# Patient Record
Sex: Female | Born: 1940
Health system: Southern US, Community
[De-identification: ages and names within clinical notes are randomized; demographics above are authoritative.]

## PROBLEM LIST (undated history)

## (undated) ENCOUNTER — Emergency Department (HOSPITAL_COMMUNITY): Discharge status: Absent without leave | Payer: PPO

## (undated) DIAGNOSIS — R7303 Prediabetes: Secondary | ICD-10-CM

## (undated) DIAGNOSIS — I4891 Unspecified atrial fibrillation: Secondary | ICD-10-CM

## (undated) DIAGNOSIS — N2889 Other specified disorders of kidney and ureter: Secondary | ICD-10-CM

## (undated) DIAGNOSIS — I10 Essential (primary) hypertension: Secondary | ICD-10-CM

## (undated) DIAGNOSIS — I2510 Atherosclerotic heart disease of native coronary artery without angina pectoris: Secondary | ICD-10-CM

## (undated) DIAGNOSIS — I5022 Chronic systolic (congestive) heart failure: Secondary | ICD-10-CM

## (undated) DIAGNOSIS — I639 Cerebral infarction, unspecified: Secondary | ICD-10-CM

## (undated) DIAGNOSIS — E039 Hypothyroidism, unspecified: Secondary | ICD-10-CM

## (undated) DIAGNOSIS — J449 Chronic obstructive pulmonary disease, unspecified: Secondary | ICD-10-CM

## (undated) DIAGNOSIS — F419 Anxiety disorder, unspecified: Secondary | ICD-10-CM

## (undated) HISTORY — DX: Cerebral infarction, unspecified: I63.9

---

## 1997-12-26 ENCOUNTER — Inpatient Hospital Stay (HOSPITAL_COMMUNITY): Admission: AD | Admit: 1997-12-26 | Discharge: 1997-12-29 | Payer: Self-pay | Admitting: Cardiology

## 2015-01-10 ENCOUNTER — Other Ambulatory Visit: Payer: Self-pay | Admitting: Neurosurgery

## 2015-01-10 DIAGNOSIS — M48061 Spinal stenosis, lumbar region without neurogenic claudication: Secondary | ICD-10-CM

## 2015-03-01 ENCOUNTER — Ambulatory Visit
Admission: RE | Admit: 2015-03-01 | Discharge: 2015-03-01 | Disposition: A | Discharge status: Home / Self Care | Payer: Commercial Managed Care - HMO | Source: Ambulatory Visit | Attending: Neurosurgery | Admitting: Neurosurgery

## 2015-03-01 DIAGNOSIS — M48061 Spinal stenosis, lumbar region without neurogenic claudication: Secondary | ICD-10-CM

## 2015-03-01 MED ORDER — METHYLPREDNISOLONE ACETATE 40 MG/ML INJ SUSP (RADIOLOG
120.0000 mg | Freq: Once | INTRAMUSCULAR | Status: AC
  Administered 2015-03-01: 120 mg via EPIDURAL

## 2015-03-01 MED ORDER — IOHEXOL 180 MG/ML  SOLN
1.0000 mL | Freq: Once | INTRAMUSCULAR | Status: AC | PRN
  Administered 2015-03-01: 1 mL via EPIDURAL

## 2015-03-01 NOTE — Discharge Instructions (Addendum)
Post Procedure Spinal Discharge Instruction Sheet  1. You may resume a regular diet and any medications that you routinely take (including pain medications).  2. No driving day of procedure.  3. Light activity throughout the rest of the day.  Do not do any strenuous work, exercise, bending or lifting.  The day following the procedure, you can resume normal physical activity but you should refrain from exercising or physical therapy for at least three days thereafter.   Common Side Effects:   Headaches- take your usual medications as directed by your physician.  Increase your fluid intake.  Caffeinated beverages may be helpful.  Lie flat in bed until your headache resolves.   Restlessness or inability to sleep- you may have trouble sleeping for the next few days.  Ask your referring physician if you need any medication for sleep.   Facial flushing or redness- should subside within a few days.   Increased pain- a temporary increase in pain a day or two following your procedure is not unusual.  Take your pain medication as prescribed by your referring physician.   Leg cramps  Please contact our office at [phone] for the following symptoms:  Fever greater than 100 degrees.  Headaches unresolved with medication after 2-3 days.  Increased swelling, pain, or redness at injection site.  Thank you for visiting our office.   [facility] [place].

## 2015-05-16 DIAGNOSIS — I482 Chronic atrial fibrillation, unspecified: Secondary | ICD-10-CM | POA: Insufficient documentation

## 2015-05-16 DIAGNOSIS — E785 Hyperlipidemia, unspecified: Secondary | ICD-10-CM | POA: Insufficient documentation

## 2015-05-16 DIAGNOSIS — I10 Essential (primary) hypertension: Secondary | ICD-10-CM | POA: Insufficient documentation

## 2015-05-16 DIAGNOSIS — E088 Diabetes mellitus due to underlying condition with unspecified complications: Secondary | ICD-10-CM | POA: Insufficient documentation

## 2015-05-16 DIAGNOSIS — I2510 Atherosclerotic heart disease of native coronary artery without angina pectoris: Secondary | ICD-10-CM | POA: Insufficient documentation

## 2015-05-16 DIAGNOSIS — I6522 Occlusion and stenosis of left carotid artery: Secondary | ICD-10-CM | POA: Insufficient documentation

## 2015-06-14 ENCOUNTER — Other Ambulatory Visit: Payer: Self-pay | Admitting: Neurosurgery

## 2015-06-14 DIAGNOSIS — M48061 Spinal stenosis, lumbar region without neurogenic claudication: Secondary | ICD-10-CM

## 2015-06-20 ENCOUNTER — Ambulatory Visit
Admission: RE | Admit: 2015-06-20 | Discharge: 2015-06-20 | Disposition: A | Discharge status: Home / Self Care | Payer: Commercial Managed Care - HMO | Source: Ambulatory Visit | Attending: Neurosurgery | Admitting: Neurosurgery

## 2015-06-20 DIAGNOSIS — M48061 Spinal stenosis, lumbar region without neurogenic claudication: Secondary | ICD-10-CM

## 2015-06-20 MED ORDER — METHYLPREDNISOLONE ACETATE 40 MG/ML INJ SUSP (RADIOLOG
120.0000 mg | Freq: Once | INTRAMUSCULAR | Status: AC
  Administered 2015-06-20: 120 mg via EPIDURAL

## 2015-06-20 MED ORDER — IOHEXOL 180 MG/ML  SOLN
1.0000 mL | Freq: Once | INTRAMUSCULAR | Status: DC | PRN
  Administered 2015-06-20: 1 mL via EPIDURAL

## 2015-06-20 NOTE — Discharge Instructions (Signed)

## 2015-11-14 DIAGNOSIS — I10 Essential (primary) hypertension: Secondary | ICD-10-CM | POA: Diagnosis not present

## 2015-11-14 DIAGNOSIS — E088 Diabetes mellitus due to underlying condition with unspecified complications: Secondary | ICD-10-CM | POA: Diagnosis not present

## 2015-11-14 DIAGNOSIS — I482 Chronic atrial fibrillation: Secondary | ICD-10-CM | POA: Diagnosis not present

## 2015-11-14 DIAGNOSIS — E785 Hyperlipidemia, unspecified: Secondary | ICD-10-CM | POA: Diagnosis not present

## 2015-11-14 DIAGNOSIS — I6522 Occlusion and stenosis of left carotid artery: Secondary | ICD-10-CM | POA: Diagnosis not present

## 2015-11-21 DIAGNOSIS — R7309 Other abnormal glucose: Secondary | ICD-10-CM | POA: Diagnosis not present

## 2015-11-21 DIAGNOSIS — Z6837 Body mass index (BMI) 37.0-37.9, adult: Secondary | ICD-10-CM | POA: Diagnosis not present

## 2015-11-21 DIAGNOSIS — E785 Hyperlipidemia, unspecified: Secondary | ICD-10-CM | POA: Diagnosis not present

## 2015-11-21 DIAGNOSIS — J449 Chronic obstructive pulmonary disease, unspecified: Secondary | ICD-10-CM | POA: Diagnosis not present

## 2015-11-21 DIAGNOSIS — I10 Essential (primary) hypertension: Secondary | ICD-10-CM | POA: Diagnosis not present

## 2016-03-14 DIAGNOSIS — R7309 Other abnormal glucose: Secondary | ICD-10-CM | POA: Diagnosis not present

## 2016-03-14 DIAGNOSIS — E785 Hyperlipidemia, unspecified: Secondary | ICD-10-CM | POA: Diagnosis not present

## 2016-03-14 DIAGNOSIS — M25572 Pain in left ankle and joints of left foot: Secondary | ICD-10-CM | POA: Diagnosis not present

## 2016-03-14 DIAGNOSIS — M4806 Spinal stenosis, lumbar region: Secondary | ICD-10-CM | POA: Diagnosis not present

## 2016-03-14 DIAGNOSIS — J449 Chronic obstructive pulmonary disease, unspecified: Secondary | ICD-10-CM | POA: Diagnosis not present

## 2016-03-14 DIAGNOSIS — M545 Low back pain: Secondary | ICD-10-CM | POA: Diagnosis not present

## 2016-03-14 DIAGNOSIS — I10 Essential (primary) hypertension: Secondary | ICD-10-CM | POA: Diagnosis not present

## 2016-03-25 DIAGNOSIS — M79672 Pain in left foot: Secondary | ICD-10-CM | POA: Diagnosis not present

## 2016-03-25 DIAGNOSIS — M79671 Pain in right foot: Secondary | ICD-10-CM | POA: Insufficient documentation

## 2016-03-25 DIAGNOSIS — M722 Plantar fascial fibromatosis: Secondary | ICD-10-CM | POA: Insufficient documentation

## 2016-03-25 DIAGNOSIS — M24571 Contracture, right ankle: Secondary | ICD-10-CM | POA: Insufficient documentation

## 2016-03-25 DIAGNOSIS — R202 Paresthesia of skin: Secondary | ICD-10-CM | POA: Insufficient documentation

## 2016-05-08 DIAGNOSIS — I482 Chronic atrial fibrillation: Secondary | ICD-10-CM | POA: Diagnosis not present

## 2016-05-08 DIAGNOSIS — I10 Essential (primary) hypertension: Secondary | ICD-10-CM | POA: Diagnosis not present

## 2016-05-08 DIAGNOSIS — E785 Hyperlipidemia, unspecified: Secondary | ICD-10-CM | POA: Diagnosis not present

## 2016-05-08 DIAGNOSIS — I2510 Atherosclerotic heart disease of native coronary artery without angina pectoris: Secondary | ICD-10-CM | POA: Diagnosis not present

## 2016-05-08 DIAGNOSIS — I6522 Occlusion and stenosis of left carotid artery: Secondary | ICD-10-CM | POA: Diagnosis not present

## 2016-05-08 DIAGNOSIS — E088 Diabetes mellitus due to underlying condition with unspecified complications: Secondary | ICD-10-CM | POA: Diagnosis not present

## 2016-06-17 DIAGNOSIS — M8589 Other specified disorders of bone density and structure, multiple sites: Secondary | ICD-10-CM | POA: Diagnosis not present

## 2016-06-17 DIAGNOSIS — E039 Hypothyroidism, unspecified: Secondary | ICD-10-CM | POA: Diagnosis not present

## 2016-06-17 DIAGNOSIS — J449 Chronic obstructive pulmonary disease, unspecified: Secondary | ICD-10-CM | POA: Diagnosis not present

## 2016-06-17 DIAGNOSIS — E785 Hyperlipidemia, unspecified: Secondary | ICD-10-CM | POA: Diagnosis not present

## 2016-06-17 DIAGNOSIS — I10 Essential (primary) hypertension: Secondary | ICD-10-CM | POA: Diagnosis not present

## 2016-06-17 DIAGNOSIS — R7309 Other abnormal glucose: Secondary | ICD-10-CM | POA: Diagnosis not present

## 2016-06-17 DIAGNOSIS — Z23 Encounter for immunization: Secondary | ICD-10-CM | POA: Diagnosis not present

## 2016-06-17 DIAGNOSIS — Z1231 Encounter for screening mammogram for malignant neoplasm of breast: Secondary | ICD-10-CM | POA: Diagnosis not present

## 2016-07-12 DIAGNOSIS — Z1231 Encounter for screening mammogram for malignant neoplasm of breast: Secondary | ICD-10-CM | POA: Diagnosis not present

## 2016-10-17 DIAGNOSIS — E785 Hyperlipidemia, unspecified: Secondary | ICD-10-CM | POA: Diagnosis not present

## 2016-10-17 DIAGNOSIS — I480 Paroxysmal atrial fibrillation: Secondary | ICD-10-CM | POA: Diagnosis not present

## 2016-10-17 DIAGNOSIS — Z1389 Encounter for screening for other disorder: Secondary | ICD-10-CM | POA: Diagnosis not present

## 2016-10-17 DIAGNOSIS — I10 Essential (primary) hypertension: Secondary | ICD-10-CM | POA: Diagnosis not present

## 2016-10-17 DIAGNOSIS — R7309 Other abnormal glucose: Secondary | ICD-10-CM | POA: Diagnosis not present

## 2016-11-18 DIAGNOSIS — M1A9XX1 Chronic gout, unspecified, with tophus (tophi): Secondary | ICD-10-CM | POA: Diagnosis not present

## 2016-11-18 DIAGNOSIS — M10072 Idiopathic gout, left ankle and foot: Secondary | ICD-10-CM | POA: Diagnosis not present

## 2016-11-20 DIAGNOSIS — I10 Essential (primary) hypertension: Secondary | ICD-10-CM | POA: Diagnosis not present

## 2016-11-20 DIAGNOSIS — M1A9XX1 Chronic gout, unspecified, with tophus (tophi): Secondary | ICD-10-CM | POA: Diagnosis not present

## 2016-11-21 DIAGNOSIS — I2510 Atherosclerotic heart disease of native coronary artery without angina pectoris: Secondary | ICD-10-CM | POA: Diagnosis not present

## 2016-11-21 DIAGNOSIS — E785 Hyperlipidemia, unspecified: Secondary | ICD-10-CM | POA: Diagnosis not present

## 2016-11-21 DIAGNOSIS — I482 Chronic atrial fibrillation: Secondary | ICD-10-CM | POA: Diagnosis not present

## 2016-11-21 DIAGNOSIS — E088 Diabetes mellitus due to underlying condition with unspecified complications: Secondary | ICD-10-CM | POA: Diagnosis not present

## 2016-11-21 DIAGNOSIS — I10 Essential (primary) hypertension: Secondary | ICD-10-CM | POA: Diagnosis not present

## 2016-11-21 DIAGNOSIS — I6522 Occlusion and stenosis of left carotid artery: Secondary | ICD-10-CM | POA: Diagnosis not present

## 2016-12-16 DIAGNOSIS — M1A9XX1 Chronic gout, unspecified, with tophus (tophi): Secondary | ICD-10-CM | POA: Diagnosis not present

## 2016-12-16 DIAGNOSIS — M2062 Acquired deformities of toe(s), unspecified, left foot: Secondary | ICD-10-CM | POA: Diagnosis not present

## 2016-12-20 DIAGNOSIS — Z139 Encounter for screening, unspecified: Secondary | ICD-10-CM | POA: Diagnosis not present

## 2016-12-20 DIAGNOSIS — M1A9XX1 Chronic gout, unspecified, with tophus (tophi): Secondary | ICD-10-CM | POA: Diagnosis not present

## 2016-12-20 DIAGNOSIS — I10 Essential (primary) hypertension: Secondary | ICD-10-CM | POA: Diagnosis not present

## 2017-01-07 DIAGNOSIS — Z139 Encounter for screening, unspecified: Secondary | ICD-10-CM | POA: Diagnosis not present

## 2017-01-07 DIAGNOSIS — I10 Essential (primary) hypertension: Secondary | ICD-10-CM | POA: Diagnosis not present

## 2017-01-07 DIAGNOSIS — J441 Chronic obstructive pulmonary disease with (acute) exacerbation: Secondary | ICD-10-CM | POA: Diagnosis not present

## 2017-01-07 DIAGNOSIS — I480 Paroxysmal atrial fibrillation: Secondary | ICD-10-CM | POA: Diagnosis not present

## 2017-03-20 DIAGNOSIS — M545 Low back pain: Secondary | ICD-10-CM | POA: Diagnosis not present

## 2017-03-20 DIAGNOSIS — M48061 Spinal stenosis, lumbar region without neurogenic claudication: Secondary | ICD-10-CM | POA: Diagnosis not present

## 2017-03-31 ENCOUNTER — Ambulatory Visit: Payer: Commercial Managed Care - HMO | Admitting: Cardiology

## 2017-04-02 ENCOUNTER — Ambulatory Visit: Payer: Commercial Managed Care - HMO | Admitting: Cardiology

## 2017-04-03 ENCOUNTER — Encounter: Payer: Self-pay | Admitting: Cardiology

## 2017-04-03 ENCOUNTER — Ambulatory Visit (INDEPENDENT_AMBULATORY_CARE_PROVIDER_SITE_OTHER): Payer: PPO | Admitting: Cardiology

## 2017-04-03 VITALS — BP 126/66 | HR 68 | Ht 65.0 in | Wt 193.1 lb

## 2017-04-03 DIAGNOSIS — I482 Chronic atrial fibrillation, unspecified: Secondary | ICD-10-CM

## 2017-04-03 DIAGNOSIS — E785 Hyperlipidemia, unspecified: Secondary | ICD-10-CM

## 2017-04-03 DIAGNOSIS — I2510 Atherosclerotic heart disease of native coronary artery without angina pectoris: Secondary | ICD-10-CM

## 2017-04-03 DIAGNOSIS — R0989 Other specified symptoms and signs involving the circulatory and respiratory systems: Secondary | ICD-10-CM | POA: Diagnosis not present

## 2017-04-03 DIAGNOSIS — I10 Essential (primary) hypertension: Secondary | ICD-10-CM | POA: Diagnosis not present

## 2017-04-03 DIAGNOSIS — Z01810 Encounter for preprocedural cardiovascular examination: Secondary | ICD-10-CM

## 2017-04-03 DIAGNOSIS — E088 Diabetes mellitus due to underlying condition with unspecified complications: Secondary | ICD-10-CM | POA: Diagnosis not present

## 2017-04-03 MED ORDER — ASPIRIN EC 325 MG PO TBEC: 325.0000 mg | DELAYED_RELEASE_TABLET | Freq: Every day | ORAL | 0 refills | Status: DC

## 2017-04-03 NOTE — Progress Notes (Signed)
Cardiology Office Note:    Date:  04/03/2017   ID:  Kaitlyn Underwood, DOB 30-Nov-1940, MRN [id number]  PCP:  Kaitlyn Dress, MD  Cardiologist:  Kaitlyn Lindau, MD   Referring MD: Kaitlyn Dress, MD    ASSESSMENT:    1. Pre-operative cardiovascular examination   2. Chronic atrial fibrillation (HCC)   3. Coronary artery disease involving native coronary artery of native heart without angina pectoris   4. Essential hypertension   5. Diabetes mellitus due to underlying condition with complication, without long-term current use of insulin ([place])   6. Dyslipidemia    PLAN:    In order of problems listed above:  1. I discussed my findings preoperative risk stratification. She is a sedentary woman with multiple continued risk factors for coronary artery disease. She is morbidly obese. She is asymptomatic. In view of the above she will undergo Lexiscan sestamibi. If this is negative then she is not at high risk for coronary events during the aforementioned surgery. Meticulous hemodynamic monitoring will further reduce the risk of coronary events. 2. I discussed with the patient atrial fibrillation, disease process. Management and therapy including rate and rhythm control, anticoagulation benefits and potential risks were discussed extensively with the patient. Patient is not on anticoagulation now because of gait instability and high propensity for falls. She has been advised now and in the past to take full-strength coated aspirin. Patient had multiple questions which were answered to patient's satisfaction. 3. Patient's blood pressure stable. Diet was discussed with dyslipidemia and diabetes mellitus. These issues are followed by her primary care physician. She will be seen in follow-up appointment in 6 months or earlier if she has any concerns. 4. In view of bilateral carotid bruit she will undergo bilateral carotid Dopplers.   Medication Adjustments/Labs and Tests Ordered: Current  medicines are reviewed at length with the patient today.  Concerns regarding medicines are outlined above.  Orders Placed This Encounter  Procedures  . Myocardial Perfusion Imaging  . EKG 12-Lead   No orders of the defined types were placed in this encounter.    History of Present Illness:    Kaitlyn Underwood is a 76 y.o. female who is being seen today for the evaluation of Preoperative risk stratification for coronary artery disease at the request of Kaitlyn Dress, MD. Patient is a pleasant 76 year old female. I have taken care of her in my previous practice and she is now here to be transferred and wants to transfer her care to this practice. She is in a wheelchair accompanied by her supportive husband. She denies any history of chest pain orthopnea or PND. She has history of coronary artery disease post stenting in the remote past, essential hypertension, dyslipidemia, diabetes mellitus and obesity. She leads a very sedentary lifestyle. She is asymptomatic at this time.  History reviewed. No pertinent past medical history.  History reviewed. No pertinent surgical history.  Current Medications: Current Meds  Medication Sig  . albuterol (PROVENTIL HFA;VENTOLIN HFA) 108 (90 Base) MCG/ACT inhaler Inhale into the lungs.  . Ascorbic Acid (VITAMIN C) 1000 MG tablet Take 1,000 mg by mouth 2 (two) times daily.  . budesonide (PULMICORT) 0.5 MG/2ML nebulizer solution Inhale 0.5 mg into the lungs daily.  . budesonide-formoterol (SYMBICORT) 160-4.5 MCG/ACT inhaler Inhale 1 puff into the lungs daily.  . captopril (CAPOTEN) 50 MG tablet Take 50 mg by mouth 3 (three) times daily.  . digoxin (LANOXIN) 0.125 MG tablet Take 0.125 mg by mouth daily.  Marland Kitchen  furosemide (LASIX) 40 MG tablet Take 40 mg by mouth daily.  . Glucosamine Sulfate 500 MG TABS Take 500 mg by mouth daily.  . metFORMIN (GLUCOPHAGE) 500 MG tablet Take 500 mg by mouth daily.  . metoprolol succinate (TOPROL-XL) 50 MG 24 hr tablet Take 50 mg  by mouth daily.  . montelukast (SINGULAIR) 10 MG tablet Take 10 mg by mouth daily.  . nitroGLYCERIN (NITROSTAT) 0.4 MG SL tablet Place 0.4 mg under the tongue every 5 (five) minutes as needed.  . pantoprazole (PROTONIX) 40 MG tablet Take 40 mg by mouth daily.  . rivaroxaban (XARELTO) 20 MG TABS tablet Take 20 mg by mouth daily.  . traMADol (ULTRAM) 50 MG tablet Take 50 mg by mouth daily as needed.  . vitamin E 400 UNIT capsule Take 400 Units by mouth daily.  . [DISCONTINUED] digoxin (LANOXIN) 0.125 MG tablet TAKE ONE TABLET BY MOUTH ONCE DAILY     Allergies:   Sulfa antibiotics   Social History   Social History  . Marital status: Married    Spouse name: N/A  . Number of children: N/A  . Years of education: N/A   Social History Main Topics  . Smoking status: Former Smoker    Packs/day: 1.00    Years: 20.00    Types: Cigarettes    Quit date: 06/19/1990  . Smokeless tobacco: Never Used  . Alcohol use No  . Drug use: No  . Sexual activity: Not Asked   Other Topics Concern  . None   Social History Narrative  . None     Family History: The patient's family history is not on file.  ROS:   Please see the history of present illness.    All other systems reviewed and are negative.  EKGs/Labs/Other Studies Reviewed:    The following studies were reviewed today: I reviewed records from previous office notes extensively. Patient had multiple questions which were answered to her satisfaction.   Recent Labs: No results found for requested labs within last 8760 hours.  Recent Lipid Panel No results found for: CHOL, TRIG, HDL, CHOLHDL, VLDL, LDLCALC, LDLDIRECT  Physical Exam:    VS:  BP 126/66   Pulse 68   Ht 5\' 5"  (1.651 m)   Wt 193 lb 1.3 oz (87.6 kg)   SpO2 97%   BMI 32.13 kg/m     Wt Readings from Last 3 Encounters:  04/03/17 193 lb 1.3 oz (87.6 kg)     GEN: Patient is in no acute distress HEENT: Normal NECK: No JVD; No carotid bruits LYMPHATICS: No  lymphadenopathy CARDIAC: S1 S2 regular, 2/6 systolic murmur at the apex. RESPIRATORY:  Clear to auscultation without rales, wheezing or rhonchi  ABDOMEN: Soft, non-tender, non-distended MUSCULOSKELETAL:  No edema; No deformity  SKIN: Warm and dry NEUROLOGIC:  Alert and oriented x 3 PSYCHIATRIC:  Normal affect    Signed, Kaitlyn Lindau, MD  04/03/2017 4:58 PM     [facility]

## 2017-04-03 NOTE — Patient Instructions (Addendum)
Medication Instructions:  Your physician recommends that you continue on your current medications as directed. Please refer to the Current Medication list given to you today. 1.) Start taking Aspirin 325 mg daily.   Labwork: None   Testing/Procedures: Your physician has requested that you have a lexiscan myoview. For further information please visit [email or web address]. Please follow instruction sheet, as given.  Your physician has requested that you have a carotid duplex. This test is an ultrasound of the carotid arteries in your neck. It looks at blood flow through these arteries that supply the brain with blood. Allow one hour for this exam. There are no restrictions or special instructions.  Please report to [facility], [place], [place] [address] the day of your testing.    Follow-Up: Your physician wants you to follow-up in: 6 months.  You will receive a reminder letter in the mail two months in advance. If you don't receive a letter, please call our office to schedule the follow-up appointment.  Any Other Special Instructions Will Be Listed Below (If Applicable).  Please note that any paperwork needing to be filled out by the provider will need to be addressed at the front desk prior to seeing the provider. Please note that any paperwork FMLA, Disability or other documents regarding health condition is subject to a $25.00 charge that must be received prior to completion of paperwork in the form of a money order or check.    If you need a refill on your cardiac medications before your next appointment, please call your pharmacy.

## 2017-04-08 ENCOUNTER — Other Ambulatory Visit: Payer: Self-pay | Admitting: Cardiology

## 2017-04-08 DIAGNOSIS — R0989 Other specified symptoms and signs involving the circulatory and respiratory systems: Secondary | ICD-10-CM

## 2017-04-11 ENCOUNTER — Telehealth (HOSPITAL_COMMUNITY): Payer: Self-pay

## 2017-04-11 NOTE — Telephone Encounter (Signed)
Encounter complete. 

## 2017-04-14 DIAGNOSIS — M5442 Lumbago with sciatica, left side: Secondary | ICD-10-CM | POA: Diagnosis not present

## 2017-04-14 DIAGNOSIS — G8929 Other chronic pain: Secondary | ICD-10-CM | POA: Diagnosis not present

## 2017-04-14 DIAGNOSIS — M5136 Other intervertebral disc degeneration, lumbar region: Secondary | ICD-10-CM | POA: Diagnosis not present

## 2017-04-16 ENCOUNTER — Ambulatory Visit (HOSPITAL_COMMUNITY)
Admission: RE | Admit: 2017-04-16 | Discharge: 2017-04-16 | Disposition: A | Discharge status: Home / Self Care | Payer: PPO | Source: Ambulatory Visit | Attending: Cardiology | Admitting: Cardiology

## 2017-04-16 DIAGNOSIS — R9439 Abnormal result of other cardiovascular function study: Secondary | ICD-10-CM | POA: Diagnosis not present

## 2017-04-16 DIAGNOSIS — E119 Type 2 diabetes mellitus without complications: Secondary | ICD-10-CM | POA: Diagnosis not present

## 2017-04-16 DIAGNOSIS — I10 Essential (primary) hypertension: Secondary | ICD-10-CM | POA: Insufficient documentation

## 2017-04-16 DIAGNOSIS — Z01810 Encounter for preprocedural cardiovascular examination: Secondary | ICD-10-CM | POA: Diagnosis not present

## 2017-04-16 DIAGNOSIS — R0989 Other specified symptoms and signs involving the circulatory and respiratory systems: Secondary | ICD-10-CM | POA: Diagnosis present

## 2017-04-16 DIAGNOSIS — I6523 Occlusion and stenosis of bilateral carotid arteries: Secondary | ICD-10-CM | POA: Insufficient documentation

## 2017-04-16 DIAGNOSIS — I2510 Atherosclerotic heart disease of native coronary artery without angina pectoris: Secondary | ICD-10-CM | POA: Insufficient documentation

## 2017-04-16 LAB — MYOCARDIAL PERFUSION IMAGING
CHL CUP NUCLEAR SDS: 5
CHL CUP NUCLEAR SRS: 3
CHL CUP NUCLEAR SSS: 8
CSEPPHR: 88 {beats}/min
LV dias vol: 139 mL (ref 46–106)
LVSYSVOL: 71 mL
Rest HR: 56 {beats}/min
TID: 1.05

## 2017-04-16 MED ORDER — AMINOPHYLLINE 25 MG/ML IV SOLN
75.0000 mg | Freq: Once | INTRAVENOUS | Status: AC
  Administered 2017-04-16: 75 mg via INTRAVENOUS

## 2017-04-16 MED ORDER — TECHNETIUM TC 99M TETROFOSMIN IV KIT
32.0000 | PACK | Freq: Once | INTRAVENOUS | Status: AC | PRN
  Administered 2017-04-16: 32 via INTRAVENOUS
  Filled 2017-04-16: qty 32

## 2017-04-16 MED ORDER — TECHNETIUM TC 99M TETROFOSMIN IV KIT
9.9000 | PACK | Freq: Once | INTRAVENOUS | Status: AC | PRN
  Administered 2017-04-16: 9.9 via INTRAVENOUS
  Filled 2017-04-16: qty 10

## 2017-04-16 MED ORDER — REGADENOSON 0.4 MG/5ML IV SOLN
0.4000 mg | Freq: Once | INTRAVENOUS | Status: AC
  Administered 2017-04-16: 0.4 mg via INTRAVENOUS

## 2017-04-22 ENCOUNTER — Ambulatory Visit (HOSPITAL_BASED_OUTPATIENT_CLINIC_OR_DEPARTMENT_OTHER)
Admission: RE | Admit: 2017-04-22 | Discharge: 2017-04-22 | Disposition: A | Discharge status: Home / Self Care | Payer: PPO | Source: Ambulatory Visit | Attending: Cardiology | Admitting: Cardiology

## 2017-04-22 ENCOUNTER — Encounter: Payer: Self-pay | Admitting: Cardiology

## 2017-04-22 ENCOUNTER — Ambulatory Visit (INDEPENDENT_AMBULATORY_CARE_PROVIDER_SITE_OTHER): Payer: PPO | Admitting: Cardiology

## 2017-04-22 VITALS — BP 124/66 | HR 63 | Ht 65.0 in | Wt 190.0 lb

## 2017-04-22 DIAGNOSIS — R0989 Other specified symptoms and signs involving the circulatory and respiratory systems: Secondary | ICD-10-CM | POA: Diagnosis not present

## 2017-04-22 DIAGNOSIS — I482 Chronic atrial fibrillation, unspecified: Secondary | ICD-10-CM

## 2017-04-22 DIAGNOSIS — E088 Diabetes mellitus due to underlying condition with unspecified complications: Secondary | ICD-10-CM | POA: Diagnosis not present

## 2017-04-22 DIAGNOSIS — I2510 Atherosclerotic heart disease of native coronary artery without angina pectoris: Secondary | ICD-10-CM | POA: Diagnosis not present

## 2017-04-22 DIAGNOSIS — I119 Hypertensive heart disease without heart failure: Secondary | ICD-10-CM | POA: Insufficient documentation

## 2017-04-22 DIAGNOSIS — R931 Abnormal findings on diagnostic imaging of heart and coronary circulation: Secondary | ICD-10-CM

## 2017-04-22 DIAGNOSIS — I10 Essential (primary) hypertension: Secondary | ICD-10-CM | POA: Diagnosis not present

## 2017-04-22 DIAGNOSIS — E785 Hyperlipidemia, unspecified: Secondary | ICD-10-CM

## 2017-04-22 DIAGNOSIS — Z01810 Encounter for preprocedural cardiovascular examination: Secondary | ICD-10-CM | POA: Diagnosis not present

## 2017-04-22 NOTE — Progress Notes (Signed)
Cardiology Office Note:    Date:  04/22/2017   ID:  Kaitlyn Underwood, DOB [birth year], MRN [id number]  PCP:  Nicoletta Dress, MD  Cardiologist:  Jenean Lindau, MD   Referring MD: Nicoletta Dress, MD    ASSESSMENT:    1. Chronic atrial fibrillation ([place])   2. Abnormal nuclear cardiac imaging test   3. Coronary artery disease involving native coronary artery of native heart without angina pectoris   4. Essential hypertension   5. Bilateral carotid bruits   6. Dyslipidemia   7. Pre-operative cardiovascular examination   8. Diabetes mellitus due to underlying condition with complication, without long-term current use of insulin (HCC)    PLAN:    In order of problems listed above:  1. The patient has known coronary artery disease. She has multiple risk factors that are continuing. I discussed stress test findings with her at extensive length. I discussed coronary angiography and left heart catheterization with the patient at extensive length. Procedure, benefits and potential risks were explained. Patient had multiple questions which were answered to the patient's satisfaction. Patient agreed and consented for the procedure. Further recommendations will be made based on the findings of the coronary angiography. In the interim. The patient has any significant symptoms he knows to go to the nearest emergency room. 2. I discussed diet for dyslipidemia, diabetes mellitus and obesity and the risks were explained and she localized understanding and plans to address this. Further recommendations will be made based on the findings of the coronary angiography.   Medication Adjustments/Labs and Tests Ordered: Current medicines are reviewed at length with the patient today.  Concerns regarding medicines are outlined above.  No orders of the defined types were placed in this encounter.  No orders of the defined types were placed in this encounter.    Chief Complaint  Patient presents with  .  Follow-up    abnormal lexiscan     History of Present Illness:    Kaitlyn Underwood is a 76 y.o. female . Patient has known coronary artery disease and chronic atrial fibrillation. She leads a sedentary lifestyle. She was planning to undergo back surgery for which she came here for preoperative assessment. Her stress test is significantly abnormal and she is here to discuss the results. Again she leads a sedentary lifestyle. No chest pain orthopnea or PND. At the time of my evaluation she is alert awake oriented and in no distress.  History reviewed. No pertinent past medical history.  History reviewed. No pertinent surgical history.  Current Medications: Current Meds  Medication Sig  . albuterol (PROVENTIL HFA;VENTOLIN HFA) 108 (90 Base) MCG/ACT inhaler Inhale into the lungs.  . Ascorbic Acid (VITAMIN C) 1000 MG tablet Take 1,000 mg by mouth 2 (two) times daily.  Marland Kitchen aspirin EC 325 MG tablet Take 1 tablet (325 mg total) by mouth daily.  . budesonide (PULMICORT) 0.5 MG/2ML nebulizer solution Inhale 0.5 mg into the lungs daily.  . budesonide-formoterol (SYMBICORT) 160-4.5 MCG/ACT inhaler Inhale 1 puff into the lungs daily.  . captopril (CAPOTEN) 50 MG tablet Take 50 mg by mouth 3 (three) times daily.  . digoxin (LANOXIN) 0.125 MG tablet Take 0.125 mg by mouth daily.  . furosemide (LASIX) 40 MG tablet Take 40 mg by mouth daily.  . Glucosamine Sulfate 500 MG TABS Take 500 mg by mouth daily.  . metFORMIN (GLUCOPHAGE) 500 MG tablet Take 500 mg by mouth daily.  . metoprolol succinate (TOPROL-XL) 50 MG 24 hr tablet  Take 50 mg by mouth daily.  . montelukast (SINGULAIR) 10 MG tablet Take 10 mg by mouth daily.  . nitroGLYCERIN (NITROSTAT) 0.4 MG SL tablet Place 0.4 mg under the tongue every 5 (five) minutes as needed.  . pantoprazole (PROTONIX) 40 MG tablet Take 40 mg by mouth daily.  . traMADol (ULTRAM) 50 MG tablet Take 50 mg by mouth daily as needed.  . vitamin E 400 UNIT capsule Take 400 Units by  mouth daily.     Allergies:   Sulfa antibiotics   Social History   Social History  . Marital status: Married    Spouse name: N/A  . Number of children: N/A  . Years of education: N/A   Social History Main Topics  . Smoking status: Former Smoker    Packs/day: 1.00    Years: 20.00    Types: Cigarettes    Quit date: 06/19/1990  . Smokeless tobacco: Never Used  . Alcohol use No  . Drug use: No  . Sexual activity: Not Asked   Other Topics Concern  . None   Social History Narrative  . None     Family History: The patient's family history includes Arrhythmia in her sister; Heart attack in her maternal uncle; Heart disease in her brother.  ROS:   Please see the history of present illness.    All other systems reviewed and are negative.  EKGs/Labs/Other Studies Reviewed:    The following studies were reviewed today: Abnormal stress test report was discussed with her and her daughter at extensive length. Questions were answered to their satisfaction.   Recent Labs: No results found for requested labs within last 8760 hours.  Recent Lipid Panel No results found for: CHOL, TRIG, HDL, CHOLHDL, VLDL, LDLCALC, LDLDIRECT  Physical Exam:    VS:  BP 124/66   Pulse 63   Ht 5\' 5"  (1.651 m)   Wt 190 lb 0.6 oz (86.2 kg)   SpO2 97%   BMI 31.62 kg/m     Wt Readings from Last 3 Encounters:  04/22/17 190 lb 0.6 oz (86.2 kg)  04/16/17 193 lb (87.5 kg)  04/03/17 193 lb 1.3 oz (87.6 kg)     GEN: Patient is in no acute distress HEENT: Normal NECK: No JVD; No carotid bruits LYMPHATICS: No lymphadenopathy CARDIAC: Hear sounds regular, 2/6 systolic murmur at the apex. RESPIRATORY:  Clear to auscultation without rales, wheezing or rhonchi  ABDOMEN: Soft, non-tender, non-distended MUSCULOSKELETAL:  No edema; No deformity  SKIN: Warm and dry NEUROLOGIC:  Alert and oriented x 3 PSYCHIATRIC:  Normal affect   Signed, Jenean Lindau, MD  04/22/2017 4:07 PM    [place]  [facility]

## 2017-04-22 NOTE — Patient Instructions (Signed)
Medication Instructions:   Your physician recommends that you continue on your current medications as directed. Please refer to the Current Medication list given to you today.   Labwork:  Your physician recommends that you return for lab work in: Today ( BMP,CBC,PT/INR)   Testing/Procedures:  A chest x-ray takes a picture of the organs and structures inside the chest, including the heart, lungs, and blood vessels. This test can show several things, including, whether the heart is enlarges; whether fluid is building up in the lungs; and whether pacemaker / defibrillator leads are still in place.    [facility] [address], [place] [address] Dept: [phone] Loc: [place]  04/22/2017  You are scheduled for a Left Heart Cath on Friday, August 10th  with Dr.Harding.  1. Please arrive at the [facility] (Main Entrance A) at [facility]: [address] [place], [place] [address] at 10:00am (two hours before your procedure to ensure your preparation). Free valet parking service is available.   Special note: Every effort is made to have your procedure done on time. Please understand that emergencies sometimes delay scheduled procedures.  2. Diet: Nothing to eat or drink after midnight Thursday night.   3. Labs: BMP,CBC,PT/INR  4. Medication instructions in preparation for your procedure:   Current Outpatient Prescriptions (Endocrine & Metabolic):  .  metFORMIN (GLUCOPHAGE) 500 MG tablet, Take 500 mg by mouth daily.  Current Outpatient Prescriptions (Cardiovascular):  .  captopril (CAPOTEN) 50 MG tablet, Take 50 mg by mouth 3 (three) times daily. .  digoxin (LANOXIN) 0.125 MG tablet, Take 0.125 mg by mouth daily. .  furosemide (LASIX) 40 MG tablet, Take 40 mg by mouth daily. .  metoprolol succinate (TOPROL-XL) 50 MG 24 hr tablet, Take 50 mg by mouth daily. .   nitroGLYCERIN (NITROSTAT) 0.4 MG SL tablet, Place 0.4 mg under the tongue every 5 (five) minutes as needed.  Current Outpatient Prescriptions (Respiratory):  .  albuterol (PROVENTIL HFA;VENTOLIN HFA) 108 (90 Base) MCG/ACT inhaler, Inhale into the lungs. .  budesonide (PULMICORT) 0.5 MG/2ML nebulizer solution, Inhale 0.5 mg into the lungs daily. .  budesonide-formoterol (SYMBICORT) 160-4.5 MCG/ACT inhaler, Inhale 1 puff into the lungs daily. .  montelukast (SINGULAIR) 10 MG tablet, Take 10 mg by mouth daily.  Current Outpatient Prescriptions (Analgesics):  .  aspirin EC 325 MG tablet, Take 1 tablet (325 mg total) by mouth daily. .  traMADol (ULTRAM) 50 MG tablet, Take 50 mg by mouth daily as needed.   Current Outpatient Prescriptions (Other):  Marland Kitchen  Ascorbic Acid (VITAMIN C) 1000 MG tablet, Take 1,000 mg by mouth 2 (two) times daily. .  Glucosamine Sulfate 500 MG TABS, Take 500 mg by mouth daily. .  pantoprazole (PROTONIX) 40 MG tablet, Take 40 mg by mouth daily. .  vitamin E 400 UNIT capsule, Take 400 Units by mouth daily. *For reference purposes while preparing patient instructions.   Delete this med list prior to printing instructions for patient.*   STOP:   Metformin Thursday, Friday and 48 hours after cath or unless told otherwise by provider.     On the morning of your procedure, take your Aspirin 325 mg and any morning medicines NOT listed above.  You may use sips of water.  5. Plan for one night stay--bring personal belongings. 6. Bring a current list of your medications and current insurance cards. 7. You MUST have  a responsible person to drive you home. 8. Someone MUST be with you the first 24 hours after you arrive home or your discharge will be delayed. 9. Please wear clothes that are easy to get on and off and wear slip-on shoes.  Thank you for allowing [place] to care for you!   -- [facility]   Follow-Up: Your physician recommends that  you schedule a follow-up appointment in: 4 weeks with Dr. Geraldo Pitter.    Any Other Special Instructions Will Be Listed Below (If Applicable).     If you need a refill on your cardiac medications before your next appointment, please call your pharmacy.

## 2017-04-23 LAB — BASIC METABOLIC PANEL
BUN/Creatinine Ratio: 18 (ref 12–28)
BUN: 19 mg/dL (ref 8–27)
CALCIUM: 10.4 mg/dL — AB (ref 8.7–10.3)
CO2: 25 mmol/L (ref 20–29)
Chloride: 99 mmol/L (ref 96–106)
Creatinine, Ser: 1.04 mg/dL — ABNORMAL HIGH (ref 0.57–1.00)
GFR, EST AFRICAN AMERICAN: 61 mL/min/{1.73_m2} (ref >59)
GFR, EST NON AFRICAN AMERICAN: 53 mL/min/{1.73_m2} — AB (ref >59)
Glucose: 106 mg/dL — ABNORMAL HIGH (ref 65–99)
Potassium: 4.6 mmol/L (ref 3.5–5.2)
Sodium: 143 mmol/L (ref 134–144)

## 2017-04-23 LAB — CBC WITH DIFFERENTIAL/PLATELET
BASOS: 0 %
Basophils Absolute: 0 10*3/uL (ref 0.0–0.2)
EOS (ABSOLUTE): 0.2 10*3/uL (ref 0.0–0.4)
EOS: 2 %
HEMATOCRIT: 39.7 % (ref 34.0–46.6)
Hemoglobin: 13.5 g/dL (ref 11.1–15.9)
IMMATURE GRANS (ABS): 0 10*3/uL (ref 0.0–0.1)
IMMATURE GRANULOCYTES: 0 %
LYMPHS: 27 %
Lymphocytes Absolute: 2.8 10*3/uL (ref 0.7–3.1)
MCH: 31.7 pg (ref 26.6–33.0)
MCHC: 34 g/dL (ref 31.5–35.7)
MCV: 93 fL (ref 79–97)
MONOS ABS: 0.7 10*3/uL (ref 0.1–0.9)
Monocytes: 7 %
NEUTROS PCT: 64 %
Neutrophils Absolute: 6.7 10*3/uL (ref 1.4–7.0)
Platelets: 416 10*3/uL — ABNORMAL HIGH (ref 150–379)
RBC: 4.26 x10E6/uL (ref 3.77–5.28)
RDW: 15 % (ref 12.3–15.4)
WBC: 10.5 10*3/uL (ref 3.4–10.8)

## 2017-04-23 LAB — PROTIME-INR
INR: 1 (ref 0.8–1.2)
Prothrombin Time: 10.7 s (ref 9.1–12.0)

## 2017-04-28 ENCOUNTER — Telehealth: Payer: Self-pay

## 2017-04-28 ENCOUNTER — Telehealth: Payer: Self-pay | Admitting: Cardiology

## 2017-04-28 NOTE — Telephone Encounter (Signed)
lmtrc to discuss new date for procedure.

## 2017-04-28 NOTE — Telephone Encounter (Signed)
S.w pt and she states that her and her husband have decided to proceed with the cath tomorrow and does not wish to cancel.
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
## 2017-04-29 ENCOUNTER — Ambulatory Visit (HOSPITAL_COMMUNITY)
Admission: RE | Admit: 2017-04-29 | Discharge: 2017-04-29 | Disposition: A | Discharge status: Home / Self Care | Payer: PPO | Source: Ambulatory Visit | Attending: Cardiology | Admitting: Cardiology

## 2017-04-29 ENCOUNTER — Encounter (HOSPITAL_COMMUNITY): Payer: Self-pay | Admitting: *Deleted

## 2017-04-29 ENCOUNTER — Ambulatory Visit (HOSPITAL_COMMUNITY)
Admission: RE | Disposition: A | Discharge status: Home / Self Care | Payer: Self-pay | Source: Ambulatory Visit | Attending: Cardiology

## 2017-04-29 DIAGNOSIS — I10 Essential (primary) hypertension: Secondary | ICD-10-CM | POA: Diagnosis not present

## 2017-04-29 DIAGNOSIS — R9439 Abnormal result of other cardiovascular function study: Secondary | ICD-10-CM | POA: Diagnosis present

## 2017-04-29 DIAGNOSIS — Z87891 Personal history of nicotine dependence: Secondary | ICD-10-CM | POA: Insufficient documentation

## 2017-04-29 DIAGNOSIS — I482 Chronic atrial fibrillation, unspecified: Secondary | ICD-10-CM | POA: Diagnosis present

## 2017-04-29 DIAGNOSIS — E785 Hyperlipidemia, unspecified: Secondary | ICD-10-CM | POA: Diagnosis not present

## 2017-04-29 DIAGNOSIS — I2510 Atherosclerotic heart disease of native coronary artery without angina pectoris: Secondary | ICD-10-CM | POA: Diagnosis present

## 2017-04-29 DIAGNOSIS — R931 Abnormal findings on diagnostic imaging of heart and coronary circulation: Secondary | ICD-10-CM | POA: Diagnosis present

## 2017-04-29 DIAGNOSIS — E119 Type 2 diabetes mellitus without complications: Secondary | ICD-10-CM | POA: Insufficient documentation

## 2017-04-29 DIAGNOSIS — R0989 Other specified symptoms and signs involving the circulatory and respiratory systems: Secondary | ICD-10-CM | POA: Diagnosis present

## 2017-04-29 HISTORY — PX: ULTRASOUND GUIDANCE FOR VASCULAR ACCESS: SHX6516

## 2017-04-29 HISTORY — PX: LEFT HEART CATH AND CORONARY ANGIOGRAPHY: CATH118249

## 2017-04-29 HISTORY — DX: Prediabetes: R73.03

## 2017-04-29 LAB — GLUCOSE, CAPILLARY: GLUCOSE-CAPILLARY: 102 mg/dL — AB (ref 65–99)

## 2017-04-29 SURGERY — LEFT HEART CATH AND CORONARY ANGIOGRAPHY
Anesthesia: LOCAL

## 2017-04-29 MED ORDER — MIDAZOLAM HCL 2 MG/2ML IJ SOLN
INTRAMUSCULAR | Status: AC
  Filled 2017-04-29: qty 2

## 2017-04-29 MED ORDER — SODIUM CHLORIDE 0.9% FLUSH: 3.0000 mL | Freq: Two times a day (BID) | INTRAVENOUS | Status: DC

## 2017-04-29 MED ORDER — VERAPAMIL HCL 2.5 MG/ML IV SOLN
INTRAVENOUS | Status: DC | PRN
  Administered 2017-04-29: 3 mL via INTRA_ARTERIAL

## 2017-04-29 MED ORDER — ACETAMINOPHEN 325 MG PO TABS: 650.0000 mg | ORAL_TABLET | ORAL | Status: DC | PRN

## 2017-04-29 MED ORDER — HEPARIN SODIUM (PORCINE) 1000 UNIT/ML IJ SOLN
INTRAMUSCULAR | Status: DC | PRN
  Administered 2017-04-29: 4500 [IU] via INTRAVENOUS

## 2017-04-29 MED ORDER — FENTANYL CITRATE (PF) 100 MCG/2ML IJ SOLN
INTRAMUSCULAR | Status: DC | PRN
  Administered 2017-04-29: 50 ug via INTRAVENOUS

## 2017-04-29 MED ORDER — VERAPAMIL HCL 2.5 MG/ML IV SOLN
INTRAVENOUS | Status: AC
  Filled 2017-04-29: qty 2

## 2017-04-29 MED ORDER — SODIUM CHLORIDE 0.9 % WEIGHT BASED INFUSION: 1.0000 mL/kg/h | INTRAVENOUS | Status: DC

## 2017-04-29 MED ORDER — HEPARIN (PORCINE) IN NACL 2-0.9 UNIT/ML-% IJ SOLN
INTRAMUSCULAR | Status: AC
  Filled 2017-04-29: qty 1000

## 2017-04-29 MED ORDER — ONDANSETRON HCL 4 MG/2ML IJ SOLN: 4.0000 mg | Freq: Four times a day (QID) | INTRAMUSCULAR | Status: DC | PRN

## 2017-04-29 MED ORDER — SODIUM CHLORIDE 0.9 % IV SOLN: 250.0000 mL | INTRAVENOUS | Status: DC | PRN

## 2017-04-29 MED ORDER — FENTANYL CITRATE (PF) 100 MCG/2ML IJ SOLN
INTRAMUSCULAR | Status: AC
  Filled 2017-04-29: qty 2

## 2017-04-29 MED ORDER — LIDOCAINE HCL (PF) 1 % IJ SOLN
INTRAMUSCULAR | Status: DC | PRN
  Administered 2017-04-29: 2 mL

## 2017-04-29 MED ORDER — SODIUM CHLORIDE 0.9% FLUSH: 3.0000 mL | INTRAVENOUS | Status: DC | PRN

## 2017-04-29 MED ORDER — OXYCODONE HCL 5 MG PO TABS: 5.0000 mg | ORAL_TABLET | ORAL | Status: DC | PRN

## 2017-04-29 MED ORDER — ASPIRIN 81 MG PO CHEW: 81.0000 mg | CHEWABLE_TABLET | ORAL | Status: DC

## 2017-04-29 MED ORDER — LIDOCAINE HCL 2 % IJ SOLN
INTRAMUSCULAR | Status: AC
  Filled 2017-04-29: qty 10

## 2017-04-29 MED ORDER — HEPARIN SODIUM (PORCINE) 1000 UNIT/ML IJ SOLN
INTRAMUSCULAR | Status: AC
  Filled 2017-04-29: qty 1

## 2017-04-29 MED ORDER — SODIUM CHLORIDE 0.9 % WEIGHT BASED INFUSION
3.0000 mL/kg/h | INTRAVENOUS | Status: AC
  Administered 2017-04-29: 3 mL/kg/h via INTRAVENOUS

## 2017-04-29 MED ORDER — IOPAMIDOL (ISOVUE-370) INJECTION 76%
INTRAVENOUS | Status: DC | PRN
  Administered 2017-04-29: 60 mL via INTRAVENOUS

## 2017-04-29 MED ORDER — MIDAZOLAM HCL 2 MG/2ML IJ SOLN
INTRAMUSCULAR | Status: DC | PRN
  Administered 2017-04-29 (×2): 1 mg via INTRAVENOUS

## 2017-04-29 MED ORDER — IOPAMIDOL (ISOVUE-370) INJECTION 76%
INTRAVENOUS | Status: AC
  Filled 2017-04-29: qty 100

## 2017-04-29 SURGICAL SUPPLY — 13 items
CATH EXPO 5F FL3.5 (CATHETERS) ×1 IMPLANT
CATH EXPO 5FR FR4 (CATHETERS) ×1 IMPLANT
COVER PRB 48X5XTLSCP FOLD TPE (BAG) IMPLANT
COVER PROBE 5X48 (BAG) ×3
DEVICE RAD COMP TR BAND LRG (VASCULAR PRODUCTS) ×1 IMPLANT
GLIDESHEATH SLEND A-KIT 6F 22G (SHEATH) ×1 IMPLANT
GUIDEWIRE INQWIRE 1.5J.035X260 (WIRE) IMPLANT
INQWIRE 1.5J .035X260CM (WIRE) ×3
KIT HEART LEFT (KITS) ×3 IMPLANT
PACK CARDIAC CATHETERIZATION (CUSTOM PROCEDURE TRAY) ×3 IMPLANT
TRANSDUCER W/STOPCOCK (MISCELLANEOUS) ×3 IMPLANT
TUBING CIL FLEX 10 FLL-RA (TUBING) ×3 IMPLANT
WIRE HI TORQ VERSACORE-J 145CM (WIRE) ×1 IMPLANT

## 2017-04-29 NOTE — Discharge Instructions (Signed)
Radial Site Care Refer to this sheet in the next few weeks. These instructions provide you with information about caring for yourself after your procedure. Your health care provider may also give you more specific instructions. Your treatment has been planned according to current medical practices, but problems sometimes occur. Call your health care provider if you have any problems or questions after your procedure. What can I expect after the procedure? After your procedure, it is typical to have the following:  Bruising at the radial site that usually fades within 1-2 weeks.  Blood collecting in the tissue (hematoma) that may be painful to the touch. It should usually decrease in size and tenderness within 1-2 weeks.  Follow these instructions at home:  Take medicines only as directed by your health care provider.  You may shower 24-48 hours after the procedure or as directed by your health care provider. Remove the bandage (dressing) and gently wash the site with plain soap and water. Pat the area dry with a clean towel. Do not rub the site, because this may cause bleeding.  Do not take baths, swim, or use a hot tub until your health care provider approves.  Check your insertion site every day for redness, swelling, or drainage.  Do not apply powder or lotion to the site.  Do not flex or bend the affected arm for 24 hours or as directed by your health care provider.  Do not push or pull heavy objects with the affected arm for 24 hours or as directed by your health care provider.  Do not lift over 10 lb (4.5 kg) for 5 days after your procedure or as directed by your health care provider.  Ask your health care provider when it is okay to: ? Return to work or school. ? Resume usual physical activities or sports. ? Resume sexual activity.  Do not drive home if you are discharged the same day as the procedure. Have someone else drive you.  You may drive 24 hours after the procedure  unless otherwise instructed by your health care provider.  Do not operate machinery or power tools for 24 hours after the procedure.  If your procedure was done as an outpatient procedure, which means that you went home the same day as your procedure, a responsible adult should be with you for the first 24 hours after you arrive home.  Keep all follow-up visits as directed by your health care provider. This is important. Contact a health care provider if:  You have a fever.  You have chills.  You have increased bleeding from the radial site. Hold pressure on the site. Get help right away if:  You have unusual pain at the radial site.  You have redness, warmth, or swelling at the radial site.  You have drainage (other than a small amount of blood on the dressing) from the radial site.  The radial site is bleeding, and the bleeding does not stop after 30 minutes of holding steady pressure on the site.  Your arm or hand becomes pale, cool, tingly, or numb. This information is not intended to replace advice given to you by your health care provider. Make sure you discuss any questions you have with your health care provider. Document Released: 09/07/2010 Document Revised: 01/11/2016 Document Reviewed: 02/21/2014 Elsevier Interactive Patient Education  2018 [place] METFORMIN/GLUCOPHAGE FOR 2 DAYS    Radial Site Care Refer to this sheet in the next few weeks. These instructions provide you with information  about caring for yourself after your procedure. Your health care provider may also give you more specific instructions. Your treatment has been planned according to current medical practices, but problems sometimes occur. Call your health care provider if you have any problems or questions after your procedure. What can I expect after the procedure? After your procedure, it is typical to have the following:  Bruising at the radial site that usually fades within 1-2  weeks.  Blood collecting in the tissue (hematoma) that may be painful to the touch. It should usually decrease in size and tenderness within 1-2 weeks.  Follow these instructions at home:  Take medicines only as directed by your health care provider.  You may shower 24-48 hours after the procedure or as directed by your health care provider. Remove the bandage (dressing) and gently wash the site with plain soap and water. Pat the area dry with a clean towel. Do not rub the site, because this may cause bleeding.  Do not take baths, swim, or use a hot tub until your health care provider approves.  Check your insertion site every day for redness, swelling, or drainage.  Do not apply powder or lotion to the site.  Do not flex or bend the affected arm for 24 hours or as directed by your health care provider.  Do not push or pull heavy objects with the affected arm for 24 hours or as directed by your health care provider.  Do not lift over 10 lb (4.5 kg) for 5 days after your procedure or as directed by your health care provider.  Ask your health care provider when it is okay to: ? Return to work or school. ? Resume usual physical activities or sports. ? Resume sexual activity.  Do not drive home if you are discharged the same day as the procedure. Have someone else drive you.  You may drive 24 hours after the procedure unless otherwise instructed by your health care provider.  Do not operate machinery or power tools for 24 hours after the procedure.  If your procedure was done as an outpatient procedure, which means that you went home the same day as your procedure, a responsible adult should be with you for the first 24 hours after you arrive home.  Keep all follow-up visits as directed by your health care provider. This is important. Contact a health care provider if:  You have a fever.  You have chills.  You have increased bleeding from the radial site. Hold pressure on the  site. Get help right away if:  You have unusual pain at the radial site.  You have redness, warmth, or swelling at the radial site.  You have drainage (other than a small amount of blood on the dressing) from the radial site.  The radial site is bleeding, and the bleeding does not stop after 30 minutes of holding steady pressure on the site.  Your arm or hand becomes pale, cool, tingly, or numb. This information is not intended to replace advice given to you by your health care provider. Make sure you discuss any questions you have with your health care provider. Document Released: 09/07/2010 Document Revised: 01/11/2016 Document Reviewed: 02/21/2014 Elsevier Interactive Patient Education  2018 [employer].

## 2017-04-30 ENCOUNTER — Encounter (HOSPITAL_COMMUNITY): Payer: Self-pay | Admitting: Interventional Cardiology

## 2017-05-01 MED FILL — Heparin Sodium (Porcine) 2 Unit/ML in Sodium Chloride 0.9%: INTRAMUSCULAR | Qty: 500 | Status: AC

## 2017-05-20 ENCOUNTER — Encounter: Payer: Self-pay | Admitting: Cardiology

## 2017-05-20 ENCOUNTER — Ambulatory Visit (INDEPENDENT_AMBULATORY_CARE_PROVIDER_SITE_OTHER): Payer: PPO | Admitting: Cardiology

## 2017-05-20 VITALS — BP 138/78 | HR 75 | Ht 65.0 in | Wt 191.1 lb

## 2017-05-20 DIAGNOSIS — Z01810 Encounter for preprocedural cardiovascular examination: Secondary | ICD-10-CM

## 2017-05-20 DIAGNOSIS — I10 Essential (primary) hypertension: Secondary | ICD-10-CM

## 2017-05-20 DIAGNOSIS — R0989 Other specified symptoms and signs involving the circulatory and respiratory systems: Secondary | ICD-10-CM | POA: Diagnosis not present

## 2017-05-20 DIAGNOSIS — E785 Hyperlipidemia, unspecified: Secondary | ICD-10-CM | POA: Diagnosis not present

## 2017-05-20 DIAGNOSIS — E088 Diabetes mellitus due to underlying condition with unspecified complications: Secondary | ICD-10-CM | POA: Diagnosis not present

## 2017-05-20 DIAGNOSIS — I482 Chronic atrial fibrillation, unspecified: Secondary | ICD-10-CM

## 2017-05-20 DIAGNOSIS — I2510 Atherosclerotic heart disease of native coronary artery without angina pectoris: Secondary | ICD-10-CM

## 2017-05-20 NOTE — Patient Instructions (Signed)
Medication Instructions:  None  Labwork: None  Testing/Procedures: None  Follow-Up: 1 month  Any Other Special Instructions Will Be Listed Below (If Applicable).     If you need a refill on your cardiac medications before your next appointment, please call your pharmacy.

## 2017-05-20 NOTE — Progress Notes (Signed)
Cardiology Office Note:    Date:  05/20/2017   ID:  Kaitlyn Underwood, DOB [birth year], MRN [id number]  PCP:  Nicoletta Dress, MD  Cardiologist:  Jenean Lindau, MD   Referring MD: Nicoletta Dress, MD    ASSESSMENT:    1. Chronic atrial fibrillation ([place])   2. Coronary artery disease involving native coronary artery of native heart without angina pectoris   3. Essential hypertension   4. Diabetes mellitus due to underlying condition with complication, without long-term current use of insulin ([place])   5. Bilateral carotid bruits   6. Dyslipidemia   7. Pre-operative cardiovascular examination    PLAN:    In order of problems listed above:  1. In light of the above findings, the patient is not at high risk for coronary events during the aforementioned surgery. Meticulous hemodynamic monitoring and perioperative continued beta blocker and will further reduce the risk of coronary events. 2. Patient has chronic atrial fibrillation and needs to be on anticoagulation. I told her to get back to me as soon as her back surgeries done and okay with her surgeon to discuss these issues. If her gait is not stable and I will initiate her on full strength aspirin.I do not want [place] to start her on either of these medications in view of impending back surgery. 3. She is a diabetic and needs to be on statin therapy in view of atherosclerotic vascular disease that is now well documented by her coronary angiography. Again I will see her back in a month and initiated on these therapies. 4.    Medication Adjustments/Labs and Tests Ordered: Current medicines are reviewed at length with the patient today.  Concerns regarding medicines are outlined above.  No orders of the defined types were placed in this encounter.  No orders of the defined types were placed in this encounter.    Chief Complaint  Patient presents with  . Follow-up    post cath      History of Present Illness:    Kaitlyn Underwood is a  76 y.o. female . Patient has chronic atrial fibrillation and was evaluated for preoperative risk. Patient had an abnormal stress test and underwent coronary angiography. This has revealed nonobstructive disease. The patient also had carotid bruit and was evaluated and found to have nonobstructive disease of the carotids. She is here for follow-up appointment. She denies any chest pain orthopnea or PND. She leads a sedentary lifestyle. At the time of my evaluation she is alert awake oriented and in no distress.  Past Medical History:  Diagnosis Date  . Pre-diabetes     Past Surgical History:  Procedure Laterality Date  . LEFT HEART CATH AND CORONARY ANGIOGRAPHY N/A 04/29/2017   Procedure: LEFT HEART CATH AND CORONARY ANGIOGRAPHY;  Surgeon: Belva Crome, MD;  Location: [facility];  Service: Cardiovascular;  Laterality: N/A;  . ULTRASOUND GUIDANCE FOR VASCULAR ACCESS  04/29/2017   Procedure: Ultrasound Guidance For Vascular Access;  Surgeon: Belva Crome, MD;  Location: [facility];  Service: Cardiovascular;;    Current Medications: Current Meds  Medication Sig  . albuterol (PROVENTIL HFA;VENTOLIN HFA) 108 (90 Base) MCG/ACT inhaler Inhale 2 puffs into the lungs every 6 (six) hours as needed for wheezing or shortness of breath.   . allopurinol (ZYLOPRIM) 100 MG tablet Take 100 mg by mouth daily.  . Ascorbic Acid (VITAMIN C) 1000 MG tablet Take 1,000 mg by mouth 2 (two) times daily.  Marland Kitchen  aspirin EC 325 MG tablet Take 1 tablet (325 mg total) by mouth daily.  . budesonide (PULMICORT) 0.5 MG/2ML nebulizer solution Inhale 0.5 mg into the lungs daily.  . budesonide-formoterol (SYMBICORT) 160-4.5 MCG/ACT inhaler Inhale 1 puff into the lungs daily.  . captopril (CAPOTEN) 50 MG tablet Take 50 mg by mouth 3 (three) times daily.  . digoxin (LANOXIN) 0.125 MG tablet Take 0.125 mg by mouth daily.  . furosemide (LASIX) 40 MG tablet Take 40 mg by mouth daily.  . Glucosamine Sulfate 500 MG TABS  Take 500 mg by mouth daily.  Marland Kitchen ipratropium-albuterol (DUONEB) 0.5-2.5 (3) MG/3ML SOLN   . metFORMIN (GLUCOPHAGE) 500 MG tablet Take 500 mg by mouth daily.  . metoprolol succinate (TOPROL-XL) 50 MG 24 hr tablet Take 50 mg by mouth daily.  . montelukast (SINGULAIR) 10 MG tablet Take 10 mg by mouth daily.  . nitroGLYCERIN (NITROSTAT) 0.4 MG SL tablet Place 0.4 mg under the tongue every 5 (five) minutes as needed.  . pantoprazole (PROTONIX) 40 MG tablet Take 40 mg by mouth daily.  . traMADol (ULTRAM) 50 MG tablet Take 50 mg by mouth daily as needed for moderate pain.   . vitamin E 400 UNIT capsule Take 400 Units by mouth daily.     Allergies:   Sulfa antibiotics   Social History   Social History  . Marital status: Married    Spouse name: N/A  . Number of children: N/A  . Years of education: N/A   Social History Main Topics  . Smoking status: Former Smoker    Packs/day: 1.00    Years: 20.00    Types: Cigarettes    Quit date: 06/19/1990  . Smokeless tobacco: Never Used  . Alcohol use No  . Drug use: No  . Sexual activity: Not Asked   Other Topics Concern  . None   Social History Narrative  . None     Family History: The patient's family history includes Arrhythmia in her sister; Heart attack in her maternal uncle; Heart disease in her brother.  ROS:   Please see the history of present illness.    All other systems reviewed and are negative.  EKGs/Labs/Other Studies Reviewed:    The following studies were reviewed today: I discussed my findings with the patient at extensive length including the reports of the coronary angiography.   Recent Labs: 04/22/2017: BUN 19; Creatinine, Ser 1.04; Hemoglobin 13.5; Platelets 416; Potassium 4.6; Sodium 143  Recent Lipid Panel No results found for: CHOL, TRIG, HDL, CHOLHDL, VLDL, LDLCALC, LDLDIRECT  Physical Exam:    VS:  BP 138/78   Pulse 75   Ht 5\' 5"  (1.651 m)   Wt 191 lb 1.3 oz (86.7 kg)   SpO2 97%   BMI 31.80 kg/m      Wt Readings from Last 3 Encounters:  05/20/17 191 lb 1.3 oz (86.7 kg)  04/29/17 189 lb (85.7 kg)  04/22/17 190 lb 0.6 oz (86.2 kg)     GEN: Patient is in no acute distress HEENT: Normal NECK: No JVD; No carotid bruits LYMPHATICS: No lymphadenopathy CARDIAC: Hear sounds regular, 2/6 systolic murmur at the apex. RESPIRATORY:  Clear to auscultation without rales, wheezing or rhonchi  ABDOMEN: Soft, non-tender, non-distended MUSCULOSKELETAL:  No edema; No deformity  SKIN: Warm and dry NEUROLOGIC:  Alert and oriented x 3 PSYCHIATRIC:  Normal affect   Signed, Jenean Lindau, MD  05/20/2017 3:50 PM    [facility]

## 2017-05-22 ENCOUNTER — Other Ambulatory Visit: Payer: Self-pay

## 2017-05-22 DIAGNOSIS — I10 Essential (primary) hypertension: Secondary | ICD-10-CM

## 2017-05-22 MED ORDER — CAPTOPRIL 50 MG PO TABS: 50.0000 mg | ORAL_TABLET | Freq: Three times a day (TID) | ORAL | 12 refills | Status: DC

## 2017-05-27 DIAGNOSIS — G8929 Other chronic pain: Secondary | ICD-10-CM | POA: Diagnosis not present

## 2017-05-27 DIAGNOSIS — M5442 Lumbago with sciatica, left side: Secondary | ICD-10-CM | POA: Diagnosis not present

## 2017-06-02 ENCOUNTER — Telehealth: Payer: Self-pay | Admitting: Cardiology

## 2017-06-02 DIAGNOSIS — I10 Essential (primary) hypertension: Secondary | ICD-10-CM

## 2017-06-02 MED ORDER — CAPTOPRIL 50 MG PO TABS: 50.0000 mg | ORAL_TABLET | Freq: Three times a day (TID) | ORAL | 3 refills | Status: DC

## 2017-06-02 NOTE — Telephone Encounter (Signed)
°*  STAT* If patient is at the pharmacy, call can be transferred to refill team.   1. Which medications need to be refilled? (please list name of each medication and dose if known) Catapril  2. Which pharmacy/location (including street and city if local pharmacy) is medication to be sent to? [facility] [place]   3. Do they need a 30 day or 90 day supply? [place]

## 2017-06-09 DIAGNOSIS — M5441 Lumbago with sciatica, right side: Secondary | ICD-10-CM | POA: Diagnosis not present

## 2017-06-09 DIAGNOSIS — G8929 Other chronic pain: Secondary | ICD-10-CM | POA: Diagnosis not present

## 2017-06-09 DIAGNOSIS — M5136 Other intervertebral disc degeneration, lumbar region: Secondary | ICD-10-CM | POA: Diagnosis not present

## 2017-06-09 DIAGNOSIS — M47816 Spondylosis without myelopathy or radiculopathy, lumbar region: Secondary | ICD-10-CM | POA: Diagnosis not present

## 2017-06-17 ENCOUNTER — Encounter: Payer: Self-pay | Admitting: Cardiology

## 2017-06-17 ENCOUNTER — Ambulatory Visit (INDEPENDENT_AMBULATORY_CARE_PROVIDER_SITE_OTHER): Payer: PPO | Admitting: Cardiology

## 2017-06-17 VITALS — BP 136/84 | HR 60 | Ht 64.0 in | Wt 190.1 lb

## 2017-06-17 DIAGNOSIS — R0989 Other specified symptoms and signs involving the circulatory and respiratory systems: Secondary | ICD-10-CM | POA: Diagnosis not present

## 2017-06-17 DIAGNOSIS — I2510 Atherosclerotic heart disease of native coronary artery without angina pectoris: Secondary | ICD-10-CM | POA: Diagnosis not present

## 2017-06-17 DIAGNOSIS — E088 Diabetes mellitus due to underlying condition with unspecified complications: Secondary | ICD-10-CM | POA: Diagnosis not present

## 2017-06-17 DIAGNOSIS — I10 Essential (primary) hypertension: Secondary | ICD-10-CM

## 2017-06-17 DIAGNOSIS — I482 Chronic atrial fibrillation, unspecified: Secondary | ICD-10-CM

## 2017-06-17 DIAGNOSIS — E785 Hyperlipidemia, unspecified: Secondary | ICD-10-CM

## 2017-06-17 NOTE — Addendum Note (Signed)
Addended by: Orland Penman on: 06/17/2017 03:38 PM   Modules accepted: Orders

## 2017-06-17 NOTE — Progress Notes (Signed)
Cardiology Office Note:    Date:  06/17/2017   ID:  SHERRIANN SZUCH, DOB [birth year], MRN [id number]  PCP:  Nicoletta Dress, MD  Cardiologist:  Jenean Lindau, MD   Referring MD: Nicoletta Dress, MD    ASSESSMENT:    1. Chronic atrial fibrillation ([place])   2. Coronary artery disease involving native coronary artery of native heart without angina pectoris   3. Essential hypertension   4. Diabetes mellitus due to underlying condition with complication, without long-term current use of insulin ([place])   5. Bilateral carotid bruits   6. Dyslipidemia    PLAN:    In order of problems listed above:  1. Secondary prevention stressed to the patient. Importance of compliance with diet and medications stressed and she vocalized understanding. 2. Her blood pressure stable. Diet was discussed with dyslipidemia and diabetes mellitus. She will need blood work. She needs to be initiated on statins now reviewed the blood work and do so. 3. She did not undergo surgery and she was told by her doctor that she was high risk for the same. 4. Patient will be seen in follow-up appointment in 6 months or earlier if the patient has any concerns.    Medication Adjustments/Labs and Tests Ordered: Current medicines are reviewed at length with the patient today.  Concerns regarding medicines are outlined above.  No orders of the defined types were placed in this encounter.  No orders of the defined types were placed in this encounter.    Chief Complaint  Patient presents with  . New Patient (Initial Visit)    Doing well some very mild chest discomfort but overall ok      History of Present Illness:    Kaitlyn Underwood is a 76 y.o. female . She has past medical history of essential hypertension, dyslipidemia and diabetes mellitus and morbid obesity. Her husband brings her in a wheelchair. She has history of atrial fibrillation and her gait is unstable. She is here for follow-up. She denies any problems  at this time. No chest pain orthopnea or PND. At the time of my evaluation she is alert awake oriented and in no distress.  Past Medical History:  Diagnosis Date  . Pre-diabetes     Past Surgical History:  Procedure Laterality Date  . LEFT HEART CATH AND CORONARY ANGIOGRAPHY N/A 04/29/2017   Procedure: LEFT HEART CATH AND CORONARY ANGIOGRAPHY;  Surgeon: Belva Crome, MD;  Location: [facility];  Service: Cardiovascular;  Laterality: N/A;  . ULTRASOUND GUIDANCE FOR VASCULAR ACCESS  04/29/2017   Procedure: Ultrasound Guidance For Vascular Access;  Surgeon: Belva Crome, MD;  Location: [facility];  Service: Cardiovascular;;    Current Medications: Current Meds  Medication Sig  . albuterol (PROVENTIL HFA;VENTOLIN HFA) 108 (90 Base) MCG/ACT inhaler Inhale 2 puffs into the lungs every 6 (six) hours as needed for wheezing or shortness of breath.   . allopurinol (ZYLOPRIM) 100 MG tablet Take 100 mg by mouth daily.  . Ascorbic Acid (VITAMIN C) 1000 MG tablet Take 1,000 mg by mouth 2 (two) times daily.  Marland Kitchen aspirin EC 325 MG tablet Take 1 tablet (325 mg total) by mouth daily.  . budesonide (PULMICORT) 0.5 MG/2ML nebulizer solution Inhale 0.5 mg into the lungs daily.  . budesonide-formoterol (SYMBICORT) 160-4.5 MCG/ACT inhaler Inhale 1 puff into the lungs daily.  . captopril (CAPOTEN) 50 MG tablet Take 1 tablet (50 mg total) by mouth 3 (three) times daily.  Marland Kitchen  digoxin (LANOXIN) 0.125 MG tablet Take 0.125 mg by mouth daily.  . furosemide (LASIX) 40 MG tablet Take 40 mg by mouth daily.  . Glucosamine Sulfate 500 MG TABS Take 500 mg by mouth daily.  Marland Kitchen ipratropium-albuterol (DUONEB) 0.5-2.5 (3) MG/3ML SOLN   . metFORMIN (GLUCOPHAGE) 500 MG tablet Take 500 mg by mouth daily.  . metoprolol succinate (TOPROL-XL) 50 MG 24 hr tablet Take 50 mg by mouth daily.  . montelukast (SINGULAIR) 10 MG tablet Take 10 mg by mouth daily.  . nitroGLYCERIN (NITROSTAT) 0.4 MG SL tablet Place 0.4 mg under  the tongue every 5 (five) minutes as needed.  . pantoprazole (PROTONIX) 40 MG tablet Take 40 mg by mouth daily.  . traMADol (ULTRAM) 50 MG tablet Take 50 mg by mouth daily as needed for moderate pain.   . vitamin E 400 UNIT capsule Take 400 Units by mouth daily.     Allergies:   Sulfa antibiotics   Social History   Social History  . Marital status: Married    Spouse name: N/A  . Number of children: N/A  . Years of education: N/A   Social History Main Topics  . Smoking status: Former Smoker    Packs/day: 1.00    Years: 20.00    Types: Cigarettes    Quit date: 06/19/1990  . Smokeless tobacco: Never Used  . Alcohol use No  . Drug use: No  . Sexual activity: Not Asked   Other Topics Concern  . None   Social History Narrative  . None     Family History: The patient's family history includes Arrhythmia in her sister; Heart attack in her maternal uncle; Heart disease in her brother.  ROS:   Please see the history of present illness.    All other systems reviewed and are negative.  EKGs/Labs/Other Studies Reviewed:    The following studies were reviewed today: I reviewed my findings and coronary angiography report at length with the patient.   Recent Labs: 04/22/2017: BUN 19; Creatinine, Ser 1.04; Hemoglobin 13.5; Platelets 416; Potassium 4.6; Sodium 143  Recent Lipid Panel No results found for: CHOL, TRIG, HDL, CHOLHDL, VLDL, LDLCALC, LDLDIRECT  Physical Exam:    VS:  BP 136/84   Pulse 60   Ht 5\' 4"  (1.626 m)   Wt 190 lb 1.9 oz (86.2 kg)   SpO2 98%   BMI 32.63 kg/m     Wt Readings from Last 3 Encounters:  06/17/17 190 lb 1.9 oz (86.2 kg)  05/20/17 191 lb 1.3 oz (86.7 kg)  04/29/17 189 lb (85.7 kg)     GEN: Patient is in no acute distress HEENT: Normal NECK: No JVD; No carotid bruits LYMPHATICS: No lymphadenopathy CARDIAC: Hear sounds regular, 2/6 systolic murmur at the apex. RESPIRATORY:  Clear to auscultation without rales, wheezing or rhonchi    ABDOMEN: Soft, non-tender, non-distended MUSCULOSKELETAL:  No edema; No deformity  SKIN: Warm and dry NEUROLOGIC:  Alert and oriented x 3 PSYCHIATRIC:  Normal affect   Signed, Jenean Lindau, MD  06/17/2017 3:24 PM    [facility]

## 2017-06-17 NOTE — Patient Instructions (Signed)
Medication Instructions:   Your physician recommends that you continue on your current medications as directed. Please refer to the Current Medication list given to you today.   Labwork:  Your physician recommends that you have lab work in a few days at [facility]: CBC, BMP, Liver,Lipid, and TSH. Remember to go for this lab work fasting.    Testing/Procedures: NONE  Follow-Up: Your physician recommends that you schedule a follow-up appointment in: 6 months with Dr. Geraldo Pitter.    Any Other Special Instructions Will Be Listed Below (If Applicable).     If you need a refill on your cardiac medications before your next appointment, please call your pharmacy.

## 2017-06-30 DIAGNOSIS — Z23 Encounter for immunization: Secondary | ICD-10-CM | POA: Diagnosis not present

## 2017-06-30 DIAGNOSIS — M48061 Spinal stenosis, lumbar region without neurogenic claudication: Secondary | ICD-10-CM | POA: Diagnosis not present

## 2017-06-30 DIAGNOSIS — M545 Low back pain: Secondary | ICD-10-CM | POA: Diagnosis not present

## 2017-07-02 ENCOUNTER — Other Ambulatory Visit: Payer: Self-pay

## 2017-07-02 ENCOUNTER — Telehealth: Payer: Self-pay

## 2017-07-02 DIAGNOSIS — E785 Hyperlipidemia, unspecified: Secondary | ICD-10-CM

## 2017-07-02 NOTE — Addendum Note (Signed)
Addended by: Mattie Marlin on: 07/02/2017 11:53 AM   Modules accepted: Orders

## 2017-07-02 NOTE — Telephone Encounter (Signed)
Called patient to remind her of the blood work needed. She stated that she has an appointment to go to her PCP tomorrow to have blood work drawn.

## 2017-07-03 DIAGNOSIS — M5136 Other intervertebral disc degeneration, lumbar region: Secondary | ICD-10-CM | POA: Diagnosis not present

## 2017-07-03 DIAGNOSIS — M5441 Lumbago with sciatica, right side: Secondary | ICD-10-CM | POA: Diagnosis not present

## 2017-07-14 DIAGNOSIS — M545 Low back pain: Secondary | ICD-10-CM | POA: Diagnosis not present

## 2017-07-14 DIAGNOSIS — J441 Chronic obstructive pulmonary disease with (acute) exacerbation: Secondary | ICD-10-CM | POA: Diagnosis not present

## 2017-07-17 DIAGNOSIS — I2510 Atherosclerotic heart disease of native coronary artery without angina pectoris: Secondary | ICD-10-CM | POA: Diagnosis not present

## 2017-07-17 DIAGNOSIS — E039 Hypothyroidism, unspecified: Secondary | ICD-10-CM | POA: Diagnosis not present

## 2017-07-17 DIAGNOSIS — M1A9XX1 Chronic gout, unspecified, with tophus (tophi): Secondary | ICD-10-CM | POA: Diagnosis not present

## 2017-07-17 DIAGNOSIS — I480 Paroxysmal atrial fibrillation: Secondary | ICD-10-CM | POA: Diagnosis not present

## 2017-07-17 DIAGNOSIS — R7309 Other abnormal glucose: Secondary | ICD-10-CM | POA: Diagnosis not present

## 2017-07-17 DIAGNOSIS — I10 Essential (primary) hypertension: Secondary | ICD-10-CM | POA: Diagnosis not present

## 2017-07-17 DIAGNOSIS — E785 Hyperlipidemia, unspecified: Secondary | ICD-10-CM | POA: Diagnosis not present

## 2017-07-17 DIAGNOSIS — R262 Difficulty in walking, not elsewhere classified: Secondary | ICD-10-CM | POA: Diagnosis not present

## 2017-07-18 DIAGNOSIS — G8929 Other chronic pain: Secondary | ICD-10-CM | POA: Diagnosis not present

## 2017-07-18 DIAGNOSIS — M47816 Spondylosis without myelopathy or radiculopathy, lumbar region: Secondary | ICD-10-CM | POA: Diagnosis not present

## 2017-07-18 DIAGNOSIS — M5136 Other intervertebral disc degeneration, lumbar region: Secondary | ICD-10-CM | POA: Diagnosis not present

## 2017-07-18 DIAGNOSIS — M5441 Lumbago with sciatica, right side: Secondary | ICD-10-CM | POA: Diagnosis not present

## 2017-07-23 DIAGNOSIS — M545 Low back pain: Secondary | ICD-10-CM | POA: Diagnosis not present

## 2017-07-23 DIAGNOSIS — M48061 Spinal stenosis, lumbar region without neurogenic claudication: Secondary | ICD-10-CM | POA: Diagnosis not present

## 2017-07-23 DIAGNOSIS — M6281 Muscle weakness (generalized): Secondary | ICD-10-CM | POA: Diagnosis not present

## 2017-07-23 DIAGNOSIS — R262 Difficulty in walking, not elsewhere classified: Secondary | ICD-10-CM | POA: Diagnosis not present

## 2017-08-07 DIAGNOSIS — G8929 Other chronic pain: Secondary | ICD-10-CM | POA: Diagnosis not present

## 2017-08-07 DIAGNOSIS — M5441 Lumbago with sciatica, right side: Secondary | ICD-10-CM | POA: Diagnosis not present

## 2017-08-27 DIAGNOSIS — R262 Difficulty in walking, not elsewhere classified: Secondary | ICD-10-CM | POA: Diagnosis not present

## 2017-08-27 DIAGNOSIS — M48061 Spinal stenosis, lumbar region without neurogenic claudication: Secondary | ICD-10-CM | POA: Diagnosis not present

## 2017-08-27 DIAGNOSIS — M545 Low back pain: Secondary | ICD-10-CM | POA: Diagnosis not present

## 2017-08-27 DIAGNOSIS — M6281 Muscle weakness (generalized): Secondary | ICD-10-CM | POA: Diagnosis not present

## 2017-09-01 DIAGNOSIS — M48061 Spinal stenosis, lumbar region without neurogenic claudication: Secondary | ICD-10-CM | POA: Diagnosis not present

## 2017-09-01 DIAGNOSIS — M545 Low back pain: Secondary | ICD-10-CM | POA: Diagnosis not present

## 2017-09-01 DIAGNOSIS — R262 Difficulty in walking, not elsewhere classified: Secondary | ICD-10-CM | POA: Diagnosis not present

## 2017-09-01 DIAGNOSIS — M6281 Muscle weakness (generalized): Secondary | ICD-10-CM | POA: Diagnosis not present

## 2017-09-03 ENCOUNTER — Telehealth: Payer: Self-pay

## 2017-09-03 NOTE — Telephone Encounter (Signed)
Informed patient that per her labs from her PCP Dr. Geraldo Pitter wishes to encourage dieting to manage her lipids for the time being. Patient is understanding.

## 2017-09-04 DIAGNOSIS — M6281 Muscle weakness (generalized): Secondary | ICD-10-CM | POA: Diagnosis not present

## 2017-09-04 DIAGNOSIS — M48061 Spinal stenosis, lumbar region without neurogenic claudication: Secondary | ICD-10-CM | POA: Diagnosis not present

## 2017-09-04 DIAGNOSIS — R262 Difficulty in walking, not elsewhere classified: Secondary | ICD-10-CM | POA: Diagnosis not present

## 2017-09-04 DIAGNOSIS — M545 Low back pain: Secondary | ICD-10-CM | POA: Diagnosis not present

## 2017-09-10 DIAGNOSIS — M545 Low back pain: Secondary | ICD-10-CM | POA: Diagnosis not present

## 2017-09-10 DIAGNOSIS — R262 Difficulty in walking, not elsewhere classified: Secondary | ICD-10-CM | POA: Diagnosis not present

## 2017-09-10 DIAGNOSIS — M48061 Spinal stenosis, lumbar region without neurogenic claudication: Secondary | ICD-10-CM | POA: Diagnosis not present

## 2017-09-10 DIAGNOSIS — M6281 Muscle weakness (generalized): Secondary | ICD-10-CM | POA: Diagnosis not present

## 2017-09-12 DIAGNOSIS — R262 Difficulty in walking, not elsewhere classified: Secondary | ICD-10-CM | POA: Diagnosis not present

## 2017-09-12 DIAGNOSIS — M48061 Spinal stenosis, lumbar region without neurogenic claudication: Secondary | ICD-10-CM | POA: Diagnosis not present

## 2017-09-12 DIAGNOSIS — M545 Low back pain: Secondary | ICD-10-CM | POA: Diagnosis not present

## 2017-09-12 DIAGNOSIS — M6281 Muscle weakness (generalized): Secondary | ICD-10-CM | POA: Diagnosis not present

## 2017-09-18 DIAGNOSIS — M5136 Other intervertebral disc degeneration, lumbar region: Secondary | ICD-10-CM | POA: Insufficient documentation

## 2017-09-18 DIAGNOSIS — M51369 Other intervertebral disc degeneration, lumbar region without mention of lumbar back pain or lower extremity pain: Secondary | ICD-10-CM | POA: Insufficient documentation

## 2017-10-09 DIAGNOSIS — M545 Low back pain: Secondary | ICD-10-CM | POA: Diagnosis not present

## 2017-10-09 DIAGNOSIS — M48062 Spinal stenosis, lumbar region with neurogenic claudication: Secondary | ICD-10-CM | POA: Diagnosis not present

## 2017-10-09 DIAGNOSIS — M5136 Other intervertebral disc degeneration, lumbar region: Secondary | ICD-10-CM | POA: Diagnosis not present

## 2017-10-09 DIAGNOSIS — M4180 Other forms of scoliosis, site unspecified: Secondary | ICD-10-CM | POA: Diagnosis not present

## 2017-10-09 DIAGNOSIS — M4150 Other secondary scoliosis, site unspecified: Secondary | ICD-10-CM | POA: Insufficient documentation

## 2017-10-17 ENCOUNTER — Other Ambulatory Visit: Payer: Self-pay

## 2017-10-17 MED ORDER — DIGOXIN 125 MCG PO TABS: 0.1250 mg | ORAL_TABLET | Freq: Every day | ORAL | 1 refills | Status: DC

## 2017-10-22 DIAGNOSIS — M48061 Spinal stenosis, lumbar region without neurogenic claudication: Secondary | ICD-10-CM | POA: Diagnosis not present

## 2017-10-22 DIAGNOSIS — M6281 Muscle weakness (generalized): Secondary | ICD-10-CM | POA: Diagnosis not present

## 2017-10-22 DIAGNOSIS — M545 Low back pain: Secondary | ICD-10-CM | POA: Diagnosis not present

## 2017-10-22 DIAGNOSIS — R262 Difficulty in walking, not elsewhere classified: Secondary | ICD-10-CM | POA: Diagnosis not present

## 2017-10-30 DIAGNOSIS — R262 Difficulty in walking, not elsewhere classified: Secondary | ICD-10-CM | POA: Diagnosis not present

## 2017-10-30 DIAGNOSIS — M545 Low back pain: Secondary | ICD-10-CM | POA: Diagnosis not present

## 2017-10-30 DIAGNOSIS — M48061 Spinal stenosis, lumbar region without neurogenic claudication: Secondary | ICD-10-CM | POA: Diagnosis not present

## 2017-10-30 DIAGNOSIS — M6281 Muscle weakness (generalized): Secondary | ICD-10-CM | POA: Diagnosis not present

## 2017-11-03 DIAGNOSIS — R262 Difficulty in walking, not elsewhere classified: Secondary | ICD-10-CM | POA: Diagnosis not present

## 2017-11-03 DIAGNOSIS — M545 Low back pain: Secondary | ICD-10-CM | POA: Diagnosis not present

## 2017-11-03 DIAGNOSIS — M48061 Spinal stenosis, lumbar region without neurogenic claudication: Secondary | ICD-10-CM | POA: Diagnosis not present

## 2017-11-03 DIAGNOSIS — M6281 Muscle weakness (generalized): Secondary | ICD-10-CM | POA: Diagnosis not present

## 2017-11-06 DIAGNOSIS — M6281 Muscle weakness (generalized): Secondary | ICD-10-CM | POA: Diagnosis not present

## 2017-11-06 DIAGNOSIS — R262 Difficulty in walking, not elsewhere classified: Secondary | ICD-10-CM | POA: Diagnosis not present

## 2017-11-06 DIAGNOSIS — M545 Low back pain: Secondary | ICD-10-CM | POA: Diagnosis not present

## 2017-11-06 DIAGNOSIS — M48061 Spinal stenosis, lumbar region without neurogenic claudication: Secondary | ICD-10-CM | POA: Diagnosis not present

## 2017-11-11 DIAGNOSIS — R262 Difficulty in walking, not elsewhere classified: Secondary | ICD-10-CM | POA: Diagnosis not present

## 2017-11-11 DIAGNOSIS — M6281 Muscle weakness (generalized): Secondary | ICD-10-CM | POA: Diagnosis not present

## 2017-11-11 DIAGNOSIS — M545 Low back pain: Secondary | ICD-10-CM | POA: Diagnosis not present

## 2017-11-11 DIAGNOSIS — M48061 Spinal stenosis, lumbar region without neurogenic claudication: Secondary | ICD-10-CM | POA: Diagnosis not present

## 2017-11-12 DIAGNOSIS — M48062 Spinal stenosis, lumbar region with neurogenic claudication: Secondary | ICD-10-CM | POA: Diagnosis not present

## 2017-11-12 DIAGNOSIS — M545 Low back pain: Secondary | ICD-10-CM | POA: Diagnosis not present

## 2017-11-13 DIAGNOSIS — M545 Low back pain: Secondary | ICD-10-CM | POA: Diagnosis not present

## 2017-11-13 DIAGNOSIS — R262 Difficulty in walking, not elsewhere classified: Secondary | ICD-10-CM | POA: Diagnosis not present

## 2017-11-13 DIAGNOSIS — M6281 Muscle weakness (generalized): Secondary | ICD-10-CM | POA: Diagnosis not present

## 2017-11-13 DIAGNOSIS — M48061 Spinal stenosis, lumbar region without neurogenic claudication: Secondary | ICD-10-CM | POA: Diagnosis not present

## 2017-11-18 DIAGNOSIS — M545 Low back pain: Secondary | ICD-10-CM | POA: Diagnosis not present

## 2017-11-18 DIAGNOSIS — M6281 Muscle weakness (generalized): Secondary | ICD-10-CM | POA: Diagnosis not present

## 2017-11-18 DIAGNOSIS — M48061 Spinal stenosis, lumbar region without neurogenic claudication: Secondary | ICD-10-CM | POA: Diagnosis not present

## 2017-11-18 DIAGNOSIS — R262 Difficulty in walking, not elsewhere classified: Secondary | ICD-10-CM | POA: Diagnosis not present

## 2017-11-19 DIAGNOSIS — J449 Chronic obstructive pulmonary disease, unspecified: Secondary | ICD-10-CM | POA: Diagnosis not present

## 2017-11-19 DIAGNOSIS — I480 Paroxysmal atrial fibrillation: Secondary | ICD-10-CM | POA: Diagnosis not present

## 2017-11-19 DIAGNOSIS — I10 Essential (primary) hypertension: Secondary | ICD-10-CM | POA: Diagnosis not present

## 2017-11-19 DIAGNOSIS — M1A9XX1 Chronic gout, unspecified, with tophus (tophi): Secondary | ICD-10-CM | POA: Diagnosis not present

## 2017-11-19 DIAGNOSIS — E785 Hyperlipidemia, unspecified: Secondary | ICD-10-CM | POA: Diagnosis not present

## 2017-11-19 DIAGNOSIS — R5381 Other malaise: Secondary | ICD-10-CM | POA: Diagnosis not present

## 2017-11-19 DIAGNOSIS — Z9181 History of falling: Secondary | ICD-10-CM | POA: Diagnosis not present

## 2017-11-19 DIAGNOSIS — R739 Hyperglycemia, unspecified: Secondary | ICD-10-CM | POA: Diagnosis not present

## 2017-11-19 DIAGNOSIS — Z1331 Encounter for screening for depression: Secondary | ICD-10-CM | POA: Diagnosis not present

## 2017-11-19 DIAGNOSIS — M48061 Spinal stenosis, lumbar region without neurogenic claudication: Secondary | ICD-10-CM | POA: Diagnosis not present

## 2017-11-19 DIAGNOSIS — R7309 Other abnormal glucose: Secondary | ICD-10-CM | POA: Diagnosis not present

## 2017-11-20 DIAGNOSIS — R262 Difficulty in walking, not elsewhere classified: Secondary | ICD-10-CM | POA: Diagnosis not present

## 2017-11-20 DIAGNOSIS — M48061 Spinal stenosis, lumbar region without neurogenic claudication: Secondary | ICD-10-CM | POA: Diagnosis not present

## 2017-11-20 DIAGNOSIS — M545 Low back pain: Secondary | ICD-10-CM | POA: Diagnosis not present

## 2017-11-20 DIAGNOSIS — M6281 Muscle weakness (generalized): Secondary | ICD-10-CM | POA: Diagnosis not present

## 2017-11-25 DIAGNOSIS — R262 Difficulty in walking, not elsewhere classified: Secondary | ICD-10-CM | POA: Diagnosis not present

## 2017-11-25 DIAGNOSIS — M48061 Spinal stenosis, lumbar region without neurogenic claudication: Secondary | ICD-10-CM | POA: Diagnosis not present

## 2017-11-25 DIAGNOSIS — M545 Low back pain: Secondary | ICD-10-CM | POA: Diagnosis not present

## 2017-11-25 DIAGNOSIS — M6281 Muscle weakness (generalized): Secondary | ICD-10-CM | POA: Diagnosis not present

## 2017-11-28 DIAGNOSIS — M48061 Spinal stenosis, lumbar region without neurogenic claudication: Secondary | ICD-10-CM | POA: Diagnosis not present

## 2017-11-28 DIAGNOSIS — R262 Difficulty in walking, not elsewhere classified: Secondary | ICD-10-CM | POA: Diagnosis not present

## 2017-11-28 DIAGNOSIS — M6281 Muscle weakness (generalized): Secondary | ICD-10-CM | POA: Diagnosis not present

## 2017-11-28 DIAGNOSIS — M545 Low back pain: Secondary | ICD-10-CM | POA: Diagnosis not present

## 2017-12-01 DIAGNOSIS — M545 Low back pain: Secondary | ICD-10-CM | POA: Diagnosis not present

## 2017-12-01 DIAGNOSIS — M48061 Spinal stenosis, lumbar region without neurogenic claudication: Secondary | ICD-10-CM | POA: Diagnosis not present

## 2017-12-01 DIAGNOSIS — M6281 Muscle weakness (generalized): Secondary | ICD-10-CM | POA: Diagnosis not present

## 2017-12-01 DIAGNOSIS — R262 Difficulty in walking, not elsewhere classified: Secondary | ICD-10-CM | POA: Diagnosis not present

## 2017-12-09 DIAGNOSIS — R262 Difficulty in walking, not elsewhere classified: Secondary | ICD-10-CM | POA: Diagnosis not present

## 2017-12-09 DIAGNOSIS — M545 Low back pain: Secondary | ICD-10-CM | POA: Diagnosis not present

## 2017-12-09 DIAGNOSIS — M48061 Spinal stenosis, lumbar region without neurogenic claudication: Secondary | ICD-10-CM | POA: Diagnosis not present

## 2017-12-09 DIAGNOSIS — M6281 Muscle weakness (generalized): Secondary | ICD-10-CM | POA: Diagnosis not present

## 2017-12-11 DIAGNOSIS — R262 Difficulty in walking, not elsewhere classified: Secondary | ICD-10-CM | POA: Diagnosis not present

## 2017-12-11 DIAGNOSIS — M48061 Spinal stenosis, lumbar region without neurogenic claudication: Secondary | ICD-10-CM | POA: Diagnosis not present

## 2017-12-11 DIAGNOSIS — M545 Low back pain: Secondary | ICD-10-CM | POA: Diagnosis not present

## 2017-12-11 DIAGNOSIS — M6281 Muscle weakness (generalized): Secondary | ICD-10-CM | POA: Diagnosis not present

## 2017-12-16 DIAGNOSIS — M48061 Spinal stenosis, lumbar region without neurogenic claudication: Secondary | ICD-10-CM | POA: Diagnosis not present

## 2017-12-16 DIAGNOSIS — R262 Difficulty in walking, not elsewhere classified: Secondary | ICD-10-CM | POA: Diagnosis not present

## 2017-12-16 DIAGNOSIS — M6281 Muscle weakness (generalized): Secondary | ICD-10-CM | POA: Diagnosis not present

## 2017-12-16 DIAGNOSIS — M545 Low back pain: Secondary | ICD-10-CM | POA: Diagnosis not present

## 2018-01-22 DIAGNOSIS — Z1231 Encounter for screening mammogram for malignant neoplasm of breast: Secondary | ICD-10-CM | POA: Diagnosis not present

## 2018-01-22 DIAGNOSIS — N959 Unspecified menopausal and perimenopausal disorder: Secondary | ICD-10-CM | POA: Diagnosis not present

## 2018-01-22 DIAGNOSIS — Z1331 Encounter for screening for depression: Secondary | ICD-10-CM | POA: Diagnosis not present

## 2018-01-22 DIAGNOSIS — E669 Obesity, unspecified: Secondary | ICD-10-CM | POA: Diagnosis not present

## 2018-01-22 DIAGNOSIS — Z6837 Body mass index (BMI) 37.0-37.9, adult: Secondary | ICD-10-CM | POA: Diagnosis not present

## 2018-01-22 DIAGNOSIS — Z0000 Encounter for general adult medical examination without abnormal findings: Secondary | ICD-10-CM | POA: Diagnosis not present

## 2018-01-22 DIAGNOSIS — E785 Hyperlipidemia, unspecified: Secondary | ICD-10-CM | POA: Diagnosis not present

## 2018-01-22 DIAGNOSIS — Z136 Encounter for screening for cardiovascular disorders: Secondary | ICD-10-CM | POA: Diagnosis not present

## 2018-01-22 DIAGNOSIS — Z9181 History of falling: Secondary | ICD-10-CM | POA: Diagnosis not present

## 2018-03-24 DIAGNOSIS — R7309 Other abnormal glucose: Secondary | ICD-10-CM | POA: Diagnosis not present

## 2018-03-24 DIAGNOSIS — E785 Hyperlipidemia, unspecified: Secondary | ICD-10-CM | POA: Diagnosis not present

## 2018-03-24 DIAGNOSIS — Z6837 Body mass index (BMI) 37.0-37.9, adult: Secondary | ICD-10-CM | POA: Diagnosis not present

## 2018-03-24 DIAGNOSIS — M1A9XX1 Chronic gout, unspecified, with tophus (tophi): Secondary | ICD-10-CM | POA: Diagnosis not present

## 2018-03-24 DIAGNOSIS — I480 Paroxysmal atrial fibrillation: Secondary | ICD-10-CM | POA: Diagnosis not present

## 2018-03-24 DIAGNOSIS — J449 Chronic obstructive pulmonary disease, unspecified: Secondary | ICD-10-CM | POA: Diagnosis not present

## 2018-03-24 DIAGNOSIS — I10 Essential (primary) hypertension: Secondary | ICD-10-CM | POA: Diagnosis not present

## 2018-03-24 DIAGNOSIS — E039 Hypothyroidism, unspecified: Secondary | ICD-10-CM | POA: Diagnosis not present

## 2018-03-24 DIAGNOSIS — Z1339 Encounter for screening examination for other mental health and behavioral disorders: Secondary | ICD-10-CM | POA: Diagnosis not present

## 2018-04-17 ENCOUNTER — Ambulatory Visit: Payer: PPO | Admitting: Cardiology

## 2018-05-03 ENCOUNTER — Other Ambulatory Visit: Payer: Self-pay | Admitting: Cardiology

## 2018-05-04 ENCOUNTER — Other Ambulatory Visit: Payer: Self-pay

## 2018-05-04 MED ORDER — METOPROLOL SUCCINATE ER 50 MG PO TB24: 50.0000 mg | ORAL_TABLET | Freq: Every day | ORAL | 0 refills | Status: DC

## 2018-05-04 NOTE — Telephone Encounter (Signed)
Rx sent to pharmacy as requested.

## 2018-05-06 ENCOUNTER — Encounter: Payer: Self-pay | Admitting: Cardiology

## 2018-05-06 ENCOUNTER — Ambulatory Visit (INDEPENDENT_AMBULATORY_CARE_PROVIDER_SITE_OTHER): Payer: PPO | Admitting: Cardiology

## 2018-05-06 VITALS — BP 116/62 | HR 90 | Ht 64.0 in | Wt 200.0 lb

## 2018-05-06 DIAGNOSIS — I482 Chronic atrial fibrillation, unspecified: Secondary | ICD-10-CM

## 2018-05-06 DIAGNOSIS — I2510 Atherosclerotic heart disease of native coronary artery without angina pectoris: Secondary | ICD-10-CM

## 2018-05-06 DIAGNOSIS — I10 Essential (primary) hypertension: Secondary | ICD-10-CM | POA: Diagnosis not present

## 2018-05-06 DIAGNOSIS — E785 Hyperlipidemia, unspecified: Secondary | ICD-10-CM

## 2018-05-06 DIAGNOSIS — E088 Diabetes mellitus due to underlying condition with unspecified complications: Secondary | ICD-10-CM

## 2018-05-06 NOTE — Patient Instructions (Signed)
Medication Instructions:  Your physician recommends that you continue on your current medications as directed. Please refer to the Current Medication list given to you today.  Labwork: Your physician recommends that you have the following labs drawn: BMP, CBC, TSH, liver and lipid panel. Please come to the office fasting for these labs, no appointment necessary.  Testing/Procedures: None  Follow-Up: Your physician recommends that you schedule a follow-up appointment in: 6 months  Any Other Special Instructions Will Be Listed Below (If Applicable).     If you need a refill on your cardiac medications before your next appointment, please call your pharmacy.   [place], RN, BSN

## 2018-05-06 NOTE — Progress Notes (Signed)
Cardiology Office Note:    Date:  05/06/2018   ID:  Kaitlyn Underwood, DOB 06-13-1941, MRN [id number]  PCP:  Nicoletta Dress, MD  Cardiologist:  Jenean Lindau, MD   Referring MD: Nicoletta Dress, MD    ASSESSMENT:    1. Chronic atrial fibrillation ([place])   2. Coronary artery disease involving native coronary artery of native heart without angina pectoris   3. Essential hypertension   4. Dyslipidemia   5. Obesities, morbid ([place])   6. Diabetes mellitus due to underlying condition with complication, without long-term current use of insulin (HCC)    PLAN:    In order of problems listed above:  1. Secondary prevention stressed with the patient.  Importance of compliance with diet and medication stressed and she vocalized understanding. 2. Diet was discussed for dyslipidemia, diabetes mellitus and obesity and risks of obesity explained and she plans to do better. 3. She will continue full-strength aspirin for her atrial fibrillation.  Somehow or the other she is not taking statin therapy and I will get in the next few days to our clinic for blood work including lipids and we will initiate this for her. 4. Patient will be seen in follow-up appointment in 6 months or earlier if the patient has any concerns    Medication Adjustments/Labs and Tests Ordered: Current medicines are reviewed at length with the patient today.  Concerns regarding medicines are outlined above.  No orders of the defined types were placed in this encounter.  No orders of the defined types were placed in this encounter.    No chief complaint on file.    History of Present Illness:    Kaitlyn Underwood is a 77 y.o. female.  The patient has history of coronary artery disease, essential hypertension, dyslipidemia, diabetes mellitus and morbid obesity.  She has atrial fibrillation which is chronic in nature.  She denies any problems at this time and activities of daily living.  She leads a sedentary lifestyle.  She  ambulates with a walker.  She has unstable gait which is why she is not on anticoagulation.  She is here for routine follow-up.  Past Medical History:  Diagnosis Date  . Pre-diabetes     Past Surgical History:  Procedure Laterality Date  . LEFT HEART CATH AND CORONARY ANGIOGRAPHY N/A 04/29/2017   Procedure: LEFT HEART CATH AND CORONARY ANGIOGRAPHY;  Surgeon: Belva Crome, MD;  Location: [place] [facility];  Service: Cardiovascular;  Laterality: N/A;  . ULTRASOUND GUIDANCE FOR VASCULAR ACCESS  04/29/2017   Procedure: Ultrasound Guidance For Vascular Access;  Surgeon: Belva Crome, MD;  Location: [place] [facility];  Service: Cardiovascular;;    Current Medications: Current Meds  Medication Sig  . albuterol (PROVENTIL HFA;VENTOLIN HFA) 108 (90 Base) MCG/ACT inhaler Inhale 2 puffs into the lungs every 6 (six) hours as needed for wheezing or shortness of breath.   . allopurinol (ZYLOPRIM) 100 MG tablet Take 100 mg by mouth daily.  . Ascorbic Acid (VITAMIN C) 1000 MG tablet Take 1,000 mg by mouth 2 (two) times daily.  Marland Kitchen aspirin EC 325 MG tablet Take 1 tablet (325 mg total) by mouth daily.  . budesonide (PULMICORT) 0.5 MG/2ML nebulizer solution Inhale 0.5 mg into the lungs daily.  . budesonide-formoterol (SYMBICORT) 160-4.5 MCG/ACT inhaler Inhale 1 puff into the lungs daily.  . captopril (CAPOTEN) 50 MG tablet Take 1 tablet (50 mg total) by mouth 3 (three) times daily.  . digoxin (LANOXIN) 0.125  MG tablet TAKE 1 TABLET BY MOUTH ONCE DAILY  . furosemide (LASIX) 40 MG tablet Take 40 mg by mouth daily.  . Glucosamine Sulfate 500 MG TABS Take 500 mg by mouth daily.  Marland Kitchen ipratropium-albuterol (DUONEB) 0.5-2.5 (3) MG/3ML SOLN   . metFORMIN (GLUCOPHAGE) 500 MG tablet Take 500 mg by mouth daily.  . metoprolol succinate (TOPROL-XL) 50 MG 24 hr tablet Take 1 tablet (50 mg total) by mouth daily.  . montelukast (SINGULAIR) 10 MG tablet Take 10 mg by mouth daily.  . nitroGLYCERIN (NITROSTAT) 0.4 MG SL  tablet Place 0.4 mg under the tongue every 5 (five) minutes as needed.  . pantoprazole (PROTONIX) 40 MG tablet Take 40 mg by mouth daily.  . traMADol (ULTRAM) 50 MG tablet Take 50 mg by mouth daily as needed for moderate pain.   . vitamin E 400 UNIT capsule Take 400 Units by mouth daily.     Allergies:   Sulfa antibiotics   Social History   Socioeconomic History  . Marital status: Married    Spouse name: Not on file  . Number of children: Not on file  . Years of education: Not on file  . Highest education level: Not on file  Occupational History  . Not on file  Social Needs  . Financial resource strain: Not on file  . Food insecurity:    Worry: Not on file    Inability: Not on file  . Transportation needs:    Medical: Not on file    Non-medical: Not on file  Tobacco Use  . Smoking status: Former Smoker    Packs/day: 1.00    Years: 20.00    Pack years: 20.00    Types: Cigarettes    Last attempt to quit: 06/19/1990    Years since quitting: 27.8  . Smokeless tobacco: Never Used  Substance and Sexual Activity  . Alcohol use: No    Alcohol/week: 0.0 standard drinks  . Drug use: No  . Sexual activity: Not on file  Lifestyle  . Physical activity:    Days per week: Not on file    Minutes per session: Not on file  . Stress: Not on file  Relationships  . Social connections:    Talks on phone: Not on file    Gets together: Not on file    Attends religious service: Not on file    Active member of club or organization: Not on file    Attends meetings of clubs or organizations: Not on file    Relationship status: Not on file  Other Topics Concern  . Not on file  Social History Narrative  . Not on file     Family History: The patient's family history includes Arrhythmia in her sister; Heart attack in her maternal uncle; Heart disease in her brother.  ROS:   Please see the history of present illness.    All other systems reviewed and are negative.  EKGs/Labs/Other  Studies Reviewed:    The following studies were reviewed today: I discussed my findings with the patient at extensive length.   Recent Labs: No results found for requested labs within last 8760 hours.  Recent Lipid Panel No results found for: CHOL, TRIG, HDL, CHOLHDL, VLDL, LDLCALC, LDLDIRECT  Physical Exam:    VS:  BP 116/62 (BP Location: Right Arm, Patient Position: Sitting, Cuff Size: Normal)   Pulse 90   Ht 5\' 4"  (1.626 m)   Wt 200 lb (90.7 kg)   SpO2 97%  BMI 34.33 kg/m     Wt Readings from Last 3 Encounters:  05/06/18 200 lb (90.7 kg)  06/17/17 190 lb 1.9 oz (86.2 kg)  05/20/17 191 lb 1.3 oz (86.7 kg)     GEN: Patient is in no acute distress HEENT: Normal NECK: No JVD; No carotid bruits LYMPHATICS: No lymphadenopathy CARDIAC: Hear sounds regular, 2/6 systolic murmur at the apex. RESPIRATORY:  Clear to auscultation without rales, wheezing or rhonchi  ABDOMEN: Soft, non-tender, non-distended MUSCULOSKELETAL:  No edema; No deformity  SKIN: Warm and dry NEUROLOGIC:  Alert and oriented x 3 PSYCHIATRIC:  Normal affect   Signed, Jenean Lindau, MD  05/06/2018 2:37 PM    [place]

## 2018-05-12 DIAGNOSIS — E785 Hyperlipidemia, unspecified: Secondary | ICD-10-CM | POA: Diagnosis not present

## 2018-05-12 DIAGNOSIS — I10 Essential (primary) hypertension: Secondary | ICD-10-CM | POA: Diagnosis not present

## 2018-05-12 DIAGNOSIS — I2510 Atherosclerotic heart disease of native coronary artery without angina pectoris: Secondary | ICD-10-CM | POA: Diagnosis not present

## 2018-05-13 ENCOUNTER — Other Ambulatory Visit: Payer: Self-pay

## 2018-05-13 DIAGNOSIS — E875 Hyperkalemia: Secondary | ICD-10-CM

## 2018-05-13 LAB — CBC WITH DIFFERENTIAL/PLATELET
BASOS ABS: 0.1 10*3/uL (ref 0.0–0.2)
BASOS: 1 %
EOS (ABSOLUTE): 0.2 10*3/uL (ref 0.0–0.4)
Eos: 2 %
Hematocrit: 40.8 % (ref 34.0–46.6)
Hemoglobin: 13.7 g/dL (ref 11.1–15.9)
IMMATURE GRANS (ABS): 0 10*3/uL (ref 0.0–0.1)
Immature Granulocytes: 0 %
LYMPHS ABS: 3.3 10*3/uL — AB (ref 0.7–3.1)
Lymphs: 27 %
MCH: 31.5 pg (ref 26.6–33.0)
MCHC: 33.6 g/dL (ref 31.5–35.7)
MCV: 94 fL (ref 79–97)
Monocytes Absolute: 0.9 10*3/uL (ref 0.1–0.9)
Monocytes: 8 %
NEUTROS ABS: 7.6 10*3/uL — AB (ref 1.4–7.0)
Neutrophils: 62 %
PLATELETS: 414 10*3/uL (ref 150–450)
RBC: 4.35 x10E6/uL (ref 3.77–5.28)
RDW: 13.6 % (ref 12.3–15.4)
WBC: 12.1 10*3/uL — ABNORMAL HIGH (ref 3.4–10.8)

## 2018-05-13 LAB — BASIC METABOLIC PANEL
BUN / CREAT RATIO: 21 (ref 12–28)
BUN: 25 mg/dL (ref 8–27)
CHLORIDE: 100 mmol/L (ref 96–106)
CO2: 25 mmol/L (ref 20–29)
Calcium: 10.5 mg/dL — ABNORMAL HIGH (ref 8.7–10.3)
Creatinine, Ser: 1.2 mg/dL — ABNORMAL HIGH (ref 0.57–1.00)
GFR, EST AFRICAN AMERICAN: 51 mL/min/{1.73_m2} — AB (ref >59)
GFR, EST NON AFRICAN AMERICAN: 44 mL/min/{1.73_m2} — AB (ref >59)
Glucose: 87 mg/dL (ref 65–99)
POTASSIUM: 5.5 mmol/L — AB (ref 3.5–5.2)
SODIUM: 141 mmol/L (ref 134–144)

## 2018-05-13 LAB — HEPATIC FUNCTION PANEL
ALT: 19 IU/L (ref 0–32)
AST: 25 IU/L (ref 0–40)
Albumin: 4.3 g/dL (ref 3.5–4.8)
Alkaline Phosphatase: 119 IU/L — ABNORMAL HIGH (ref 39–117)
BILIRUBIN TOTAL: 0.7 mg/dL (ref 0.0–1.2)
BILIRUBIN, DIRECT: 0.18 mg/dL (ref 0.00–0.40)
Total Protein: 6.6 g/dL (ref 6.0–8.5)

## 2018-05-13 LAB — LIPID PANEL
CHOLESTEROL TOTAL: 189 mg/dL (ref 100–199)
Chol/HDL Ratio: 3 ratio (ref 0.0–4.4)
HDL: 63 mg/dL (ref >39)
LDL Calculated: 103 mg/dL — ABNORMAL HIGH (ref 0–99)
Triglycerides: 114 mg/dL (ref 0–149)
VLDL CHOLESTEROL CAL: 23 mg/dL (ref 5–40)

## 2018-05-13 LAB — TSH: TSH: 4.47 u[IU]/mL (ref 0.450–4.500)

## 2018-05-13 MED ORDER — SODIUM POLYSTYRENE SULFONATE PO POWD: Freq: Once | ORAL | 0 refills | Status: AC

## 2018-06-23 ENCOUNTER — Other Ambulatory Visit: Payer: Self-pay | Admitting: Cardiology

## 2018-06-23 DIAGNOSIS — I10 Essential (primary) hypertension: Secondary | ICD-10-CM

## 2018-07-05 ENCOUNTER — Inpatient Hospital Stay (HOSPITAL_COMMUNITY): Payer: PPO | Admitting: Certified Registered"

## 2018-07-05 ENCOUNTER — Inpatient Hospital Stay (HOSPITAL_COMMUNITY)
Admit: 2018-07-05 | Discharge: 2018-07-10 | DRG: 023 | Disposition: A | Discharge status: Rehabilitation Facility | Payer: PPO | Source: Other Acute Inpatient Hospital | Attending: Neurology | Admitting: Neurology

## 2018-07-05 ENCOUNTER — Encounter (HOSPITAL_COMMUNITY)
Disposition: A | Discharge status: Rehabilitation Facility | Payer: Self-pay | Source: Other Acute Inpatient Hospital | Attending: Neurology

## 2018-07-05 DIAGNOSIS — R7303 Prediabetes: Secondary | ICD-10-CM | POA: Diagnosis present

## 2018-07-05 DIAGNOSIS — J449 Chronic obstructive pulmonary disease, unspecified: Secondary | ICD-10-CM | POA: Diagnosis present

## 2018-07-05 DIAGNOSIS — E119 Type 2 diabetes mellitus without complications: Secondary | ICD-10-CM | POA: Diagnosis not present

## 2018-07-05 DIAGNOSIS — I63312 Cerebral infarction due to thrombosis of left middle cerebral artery: Secondary | ICD-10-CM | POA: Diagnosis not present

## 2018-07-05 DIAGNOSIS — R1310 Dysphagia, unspecified: Secondary | ICD-10-CM | POA: Diagnosis present

## 2018-07-05 DIAGNOSIS — I340 Nonrheumatic mitral (valve) insufficiency: Secondary | ICD-10-CM | POA: Diagnosis not present

## 2018-07-05 DIAGNOSIS — E669 Obesity, unspecified: Secondary | ICD-10-CM | POA: Diagnosis not present

## 2018-07-05 DIAGNOSIS — Z8639 Personal history of other endocrine, nutritional and metabolic disease: Secondary | ICD-10-CM

## 2018-07-05 DIAGNOSIS — R531 Weakness: Secondary | ICD-10-CM | POA: Diagnosis not present

## 2018-07-05 DIAGNOSIS — Y92239 Unspecified place in hospital as the place of occurrence of the external cause: Secondary | ICD-10-CM | POA: Diagnosis not present

## 2018-07-05 DIAGNOSIS — I482 Chronic atrial fibrillation, unspecified: Secondary | ICD-10-CM | POA: Diagnosis not present

## 2018-07-05 DIAGNOSIS — I6522 Occlusion and stenosis of left carotid artery: Secondary | ICD-10-CM | POA: Diagnosis not present

## 2018-07-05 DIAGNOSIS — Z79899 Other long term (current) drug therapy: Secondary | ICD-10-CM

## 2018-07-05 DIAGNOSIS — R29716 NIHSS score 16: Secondary | ICD-10-CM | POA: Diagnosis present

## 2018-07-05 DIAGNOSIS — I480 Paroxysmal atrial fibrillation: Secondary | ICD-10-CM | POA: Diagnosis not present

## 2018-07-05 DIAGNOSIS — G8191 Hemiplegia, unspecified affecting right dominant side: Secondary | ICD-10-CM | POA: Diagnosis not present

## 2018-07-05 DIAGNOSIS — E1122 Type 2 diabetes mellitus with diabetic chronic kidney disease: Secondary | ICD-10-CM | POA: Diagnosis not present

## 2018-07-05 DIAGNOSIS — Z9282 Status post administration of tPA (rtPA) in a different facility within the last 24 hours prior to admission to current facility: Secondary | ICD-10-CM

## 2018-07-05 DIAGNOSIS — I130 Hypertensive heart and chronic kidney disease with heart failure and stage 1 through stage 4 chronic kidney disease, or unspecified chronic kidney disease: Secondary | ICD-10-CM | POA: Diagnosis not present

## 2018-07-05 DIAGNOSIS — R4182 Altered mental status, unspecified: Secondary | ICD-10-CM | POA: Diagnosis not present

## 2018-07-05 DIAGNOSIS — F329 Major depressive disorder, single episode, unspecified: Secondary | ICD-10-CM | POA: Diagnosis not present

## 2018-07-05 DIAGNOSIS — M1990 Unspecified osteoarthritis, unspecified site: Secondary | ICD-10-CM | POA: Diagnosis not present

## 2018-07-05 DIAGNOSIS — E785 Hyperlipidemia, unspecified: Secondary | ICD-10-CM | POA: Diagnosis present

## 2018-07-05 DIAGNOSIS — R4701 Aphasia: Secondary | ICD-10-CM | POA: Diagnosis present

## 2018-07-05 DIAGNOSIS — E875 Hyperkalemia: Secondary | ICD-10-CM | POA: Diagnosis present

## 2018-07-05 DIAGNOSIS — I509 Heart failure, unspecified: Secondary | ICD-10-CM | POA: Diagnosis not present

## 2018-07-05 DIAGNOSIS — Y838 Other surgical procedures as the cause of abnormal reaction of the patient, or of later complication, without mention of misadventure at the time of the procedure: Secondary | ICD-10-CM | POA: Diagnosis not present

## 2018-07-05 DIAGNOSIS — J9600 Acute respiratory failure, unspecified whether with hypoxia or hypercapnia: Secondary | ICD-10-CM | POA: Diagnosis not present

## 2018-07-05 DIAGNOSIS — N183 Chronic kidney disease, stage 3 unspecified: Secondary | ICD-10-CM | POA: Diagnosis present

## 2018-07-05 DIAGNOSIS — Z882 Allergy status to sulfonamides status: Secondary | ICD-10-CM

## 2018-07-05 DIAGNOSIS — I169 Hypertensive crisis, unspecified: Secondary | ICD-10-CM | POA: Diagnosis present

## 2018-07-05 DIAGNOSIS — I639 Cerebral infarction, unspecified: Secondary | ICD-10-CM

## 2018-07-05 DIAGNOSIS — I5022 Chronic systolic (congestive) heart failure: Secondary | ICD-10-CM | POA: Diagnosis present

## 2018-07-05 DIAGNOSIS — I63412 Cerebral infarction due to embolism of left middle cerebral artery: Secondary | ICD-10-CM | POA: Diagnosis not present

## 2018-07-05 DIAGNOSIS — Z7982 Long term (current) use of aspirin: Secondary | ICD-10-CM

## 2018-07-05 DIAGNOSIS — J42 Unspecified chronic bronchitis: Secondary | ICD-10-CM | POA: Diagnosis not present

## 2018-07-05 DIAGNOSIS — I69392 Facial weakness following cerebral infarction: Secondary | ICD-10-CM | POA: Diagnosis not present

## 2018-07-05 DIAGNOSIS — R404 Transient alteration of awareness: Secondary | ICD-10-CM | POA: Diagnosis not present

## 2018-07-05 DIAGNOSIS — R509 Fever, unspecified: Secondary | ICD-10-CM | POA: Diagnosis not present

## 2018-07-05 DIAGNOSIS — Z4682 Encounter for fitting and adjustment of non-vascular catheter: Secondary | ICD-10-CM | POA: Diagnosis not present

## 2018-07-05 DIAGNOSIS — J410 Simple chronic bronchitis: Secondary | ICD-10-CM | POA: Diagnosis not present

## 2018-07-05 DIAGNOSIS — R0789 Other chest pain: Secondary | ICD-10-CM | POA: Diagnosis not present

## 2018-07-05 DIAGNOSIS — I6602 Occlusion and stenosis of left middle cerebral artery: Secondary | ICD-10-CM | POA: Diagnosis not present

## 2018-07-05 DIAGNOSIS — I63512 Cerebral infarction due to unspecified occlusion or stenosis of left middle cerebral artery: Secondary | ICD-10-CM | POA: Diagnosis not present

## 2018-07-05 DIAGNOSIS — J9589 Other postprocedural complications and disorders of respiratory system, not elsewhere classified: Secondary | ICD-10-CM | POA: Diagnosis not present

## 2018-07-05 DIAGNOSIS — I2510 Atherosclerotic heart disease of native coronary artery without angina pectoris: Secondary | ICD-10-CM | POA: Diagnosis not present

## 2018-07-05 DIAGNOSIS — F419 Anxiety disorder, unspecified: Secondary | ICD-10-CM | POA: Diagnosis not present

## 2018-07-05 DIAGNOSIS — R4781 Slurred speech: Secondary | ICD-10-CM | POA: Diagnosis not present

## 2018-07-05 DIAGNOSIS — E039 Hypothyroidism, unspecified: Secondary | ICD-10-CM | POA: Diagnosis not present

## 2018-07-05 DIAGNOSIS — R29810 Facial weakness: Secondary | ICD-10-CM | POA: Diagnosis not present

## 2018-07-05 DIAGNOSIS — R251 Tremor, unspecified: Secondary | ICD-10-CM | POA: Diagnosis not present

## 2018-07-05 DIAGNOSIS — R001 Bradycardia, unspecified: Secondary | ICD-10-CM | POA: Diagnosis not present

## 2018-07-05 DIAGNOSIS — R079 Chest pain, unspecified: Secondary | ICD-10-CM | POA: Diagnosis not present

## 2018-07-05 DIAGNOSIS — I110 Hypertensive heart disease with heart failure: Secondary | ICD-10-CM | POA: Diagnosis not present

## 2018-07-05 DIAGNOSIS — Z0189 Encounter for other specified special examinations: Secondary | ICD-10-CM

## 2018-07-05 DIAGNOSIS — Z6836 Body mass index (BMI) 36.0-36.9, adult: Secondary | ICD-10-CM

## 2018-07-05 DIAGNOSIS — Z87891 Personal history of nicotine dependence: Secondary | ICD-10-CM

## 2018-07-05 DIAGNOSIS — I10 Essential (primary) hypertension: Secondary | ICD-10-CM | POA: Diagnosis not present

## 2018-07-05 DIAGNOSIS — I429 Cardiomyopathy, unspecified: Secondary | ICD-10-CM | POA: Diagnosis not present

## 2018-07-05 DIAGNOSIS — I69320 Aphasia following cerebral infarction: Secondary | ICD-10-CM | POA: Diagnosis not present

## 2018-07-05 DIAGNOSIS — R0902 Hypoxemia: Secondary | ICD-10-CM | POA: Diagnosis not present

## 2018-07-05 DIAGNOSIS — I69391 Dysphagia following cerebral infarction: Secondary | ICD-10-CM | POA: Diagnosis not present

## 2018-07-05 DIAGNOSIS — J9601 Acute respiratory failure with hypoxia: Secondary | ICD-10-CM | POA: Diagnosis not present

## 2018-07-05 DIAGNOSIS — Z978 Presence of other specified devices: Secondary | ICD-10-CM | POA: Diagnosis not present

## 2018-07-05 DIAGNOSIS — N2889 Other specified disorders of kidney and ureter: Secondary | ICD-10-CM | POA: Diagnosis not present

## 2018-07-05 DIAGNOSIS — Z7951 Long term (current) use of inhaled steroids: Secondary | ICD-10-CM

## 2018-07-05 DIAGNOSIS — I69351 Hemiplegia and hemiparesis following cerebral infarction affecting right dominant side: Secondary | ICD-10-CM | POA: Diagnosis not present

## 2018-07-05 HISTORY — DX: Chronic systolic (congestive) heart failure: I50.22

## 2018-07-05 HISTORY — DX: Unspecified atrial fibrillation: I48.91

## 2018-07-05 HISTORY — DX: Anxiety disorder, unspecified: F41.9

## 2018-07-05 HISTORY — PX: RADIOLOGY WITH ANESTHESIA: SHX6223

## 2018-07-05 HISTORY — DX: Essential (primary) hypertension: I10

## 2018-07-05 HISTORY — DX: Other specified disorders of kidney and ureter: N28.89

## 2018-07-05 HISTORY — DX: Atherosclerotic heart disease of native coronary artery without angina pectoris: I25.10

## 2018-07-05 HISTORY — DX: Hypothyroidism, unspecified: E03.9

## 2018-07-05 HISTORY — DX: Chronic obstructive pulmonary disease, unspecified: J44.9

## 2018-07-05 SURGERY — RADIOLOGY WITH ANESTHESIA
Anesthesia: General

## 2018-07-05 MED ORDER — PROPOFOL 10 MG/ML IV BOLUS
INTRAVENOUS | Status: DC | PRN
  Administered 2018-07-05: 30 mg via INTRAVENOUS
  Administered 2018-07-05: 100 mg via INTRAVENOUS
  Administered 2018-07-06: 20 mg via INTRAVENOUS

## 2018-07-05 MED ORDER — NICARDIPINE HCL IN NACL 20-0.86 MG/200ML-% IV SOLN
0.0000 mg/h | INTRAVENOUS | Status: DC | PRN
  Filled 2018-07-05: qty 200

## 2018-07-05 MED ORDER — STROKE: EARLY STAGES OF RECOVERY BOOK
Freq: Once | Status: AC
  Administered 2018-07-06
  Filled 2018-07-05: qty 1

## 2018-07-05 MED ORDER — ROCURONIUM BROMIDE 50 MG/5ML IV SOSY
PREFILLED_SYRINGE | INTRAVENOUS | Status: DC | PRN
  Administered 2018-07-05: 50 mg via INTRAVENOUS
  Administered 2018-07-05: 20 mg via INTRAVENOUS
  Administered 2018-07-06: 10 mg via INTRAVENOUS

## 2018-07-05 MED ORDER — ACETAMINOPHEN 650 MG RE SUPP: 650.0000 mg | RECTAL | Status: DC | PRN

## 2018-07-05 MED ORDER — PANTOPRAZOLE SODIUM 40 MG IV SOLR: 40.0000 mg | Freq: Every day | INTRAVENOUS | Status: DC

## 2018-07-05 MED ORDER — SUCCINYLCHOLINE CHLORIDE 20 MG/ML IJ SOLN
INTRAMUSCULAR | Status: DC | PRN
  Administered 2018-07-05: 140 mg via INTRAVENOUS

## 2018-07-05 MED ORDER — LIDOCAINE 2% (20 MG/ML) 5 ML SYRINGE
INTRAMUSCULAR | Status: DC | PRN
  Administered 2018-07-05: 80 mg via INTRAVENOUS

## 2018-07-05 MED ORDER — SODIUM CHLORIDE 0.9 % IV SOLN
INTRAVENOUS | Status: DC | PRN
  Administered 2018-07-05: 50 ug/min via INTRAVENOUS

## 2018-07-05 MED ORDER — ACETAMINOPHEN 325 MG PO TABS: 650.0000 mg | ORAL_TABLET | ORAL | Status: DC | PRN

## 2018-07-05 MED ORDER — SODIUM CHLORIDE 0.9 % IV SOLN: 50.0000 mL/h | INTRAVENOUS | Status: DC

## 2018-07-05 MED ORDER — ACETAMINOPHEN 160 MG/5ML PO SOLN: 650.0000 mg | ORAL | Status: DC | PRN

## 2018-07-05 MED ORDER — ARTIFICIAL TEARS OPHTHALMIC OINT
TOPICAL_OINTMENT | OPHTHALMIC | Status: DC | PRN
  Administered 2018-07-05: 1 via OPHTHALMIC

## 2018-07-05 MED ORDER — LABETALOL HCL 5 MG/ML IV SOLN
10.0000 mg | Freq: Once | INTRAVENOUS | Status: DC | PRN
  Filled 2018-07-05: qty 4

## 2018-07-05 MED ORDER — NITROGLYCERIN 1 MG/10 ML FOR IR/CATH LAB
INTRA_ARTERIAL | Status: AC | PRN
  Administered 2018-07-05 – 2018-07-06 (×4): 25 ug via INTRA_ARTERIAL

## 2018-07-05 MED ORDER — GLYCOPYRROLATE 0.2 MG/ML IJ SOLN
INTRAMUSCULAR | Status: DC | PRN
  Administered 2018-07-05: .2 mg via INTRAVENOUS

## 2018-07-05 MED ORDER — CEFAZOLIN SODIUM-DEXTROSE 2-3 GM-%(50ML) IV SOLR
INTRAVENOUS | Status: DC | PRN
  Administered 2018-07-05: 2 g via INTRAVENOUS

## 2018-07-05 NOTE — Anesthesia Preprocedure Evaluation (Addendum)
Anesthesia Evaluation  Patient identified by MRN, date of birth, ID band Patient confused    Reviewed: Allergy & Precautions, NPO status , Patient's Chart, lab work & pertinent test resultsPreop documentation limited or incomplete due to emergent nature of procedure.  Airway Mallampati: II  TM Distance: >3 FB Neck ROM: Full    Dental no notable dental hx.    Pulmonary neg pulmonary ROS, former smoker,    Pulmonary exam normal breath sounds clear to auscultation       Cardiovascular hypertension, Pt. on home beta blockers and Pt. on medications + CAD  Normal cardiovascular exam Rhythm:Regular Rate:Normal     Neuro/Psych negative neurological ROS  negative psych ROS   GI/Hepatic negative GI ROS, Neg liver ROS,   Endo/Other  diabetes  Renal/GU negative Renal ROS     Musculoskeletal  (+) Arthritis ,   Abdominal   Peds  Hematology negative hematology ROS (+)   Anesthesia Other Findings   Reproductive/Obstetrics negative OB ROS                            Anesthesia Physical Anesthesia Plan  ASA: IV and emergent  Anesthesia Plan: General   Post-op Pain Management:    Induction: Intravenous  PONV Risk Score and Plan: 3  Airway Management Planned: Oral ETT  Additional Equipment: CVP and Arterial line  Intra-op Plan:   Post-operative Plan: Post-operative intubation/ventilation  Informed Consent: I have reviewed the patients History and Physical, chart, labs and discussed the procedure including the risks, benefits and alternatives for the proposed anesthesia with the patient or authorized representative who has indicated his/her understanding and acceptance.   Dental advisory given  Plan Discussed with: CRNA  Anesthesia Plan Comments:         Anesthesia Quick Evaluation

## 2018-07-05 NOTE — Anesthesia Procedure Notes (Signed)
Procedure Name: Intubation Date/Time: 07/05/2018 10:24 PM Performed by: Suzy Bouchard, CRNA Pre-anesthesia Checklist: Patient identified, Emergency Drugs available, Suction available, Patient being monitored and Timeout performed Patient Re-evaluated:Patient Re-evaluated prior to induction Oxygen Delivery Method: Circle system utilized Preoxygenation: Pre-oxygenation with 100% oxygen Induction Type: IV induction, Rapid sequence and Cricoid Pressure applied Laryngoscope Size: Mac and 3 Grade View: Grade I Tube type: Oral Tube size: 7.0 mm Number of attempts: 1 Airway Equipment and Method: Stylet Placement Confirmation: ETT inserted through vocal cords under direct vision,  positive ETCO2 and breath sounds checked- equal and bilateral Secured at: 23 cm Tube secured with: Tape Dental Injury: Teeth and Oropharynx as per pre-operative assessment  Comments: Intubated by Barrett Henle.

## 2018-07-05 NOTE — Sedation Documentation (Signed)
Anesthisia

## 2018-07-05 NOTE — Sedation Documentation (Signed)
Spoke with [facility] in pt placement. She see bed request and will work on placement.

## 2018-07-05 NOTE — Sedation Documentation (Signed)
Spoke with Malachy Mood, pt transport, requested bed from 4N22 be brought down to IR2.

## 2018-07-05 NOTE — Sedation Documentation (Signed)
Spoke with Stanton Kidney in pt placement regarding need for 4N bed once registration issues are resolved.

## 2018-07-05 NOTE — Sedation Documentation (Signed)
Do not place foley catheter per Dr. Estanislado Pandy.

## 2018-07-05 NOTE — Sedation Documentation (Signed)
Spoke with Olen Cordial, RN, 4N Charge. Pt will go to room 4N22. Requested O2 tank be placed on bed. Will ask transport to bring bed down.

## 2018-07-05 NOTE — Sedation Documentation (Signed)
Anesthesia unable to chart d/t pt not being registered. Called ED Charge, Gretta Cool, RN, registration clerk is working to resolve registration issues.

## 2018-07-06 ENCOUNTER — Inpatient Hospital Stay (HOSPITAL_COMMUNITY): Payer: PPO

## 2018-07-06 ENCOUNTER — Encounter (HOSPITAL_COMMUNITY): Payer: Self-pay | Admitting: Pulmonary Disease

## 2018-07-06 DIAGNOSIS — J449 Chronic obstructive pulmonary disease, unspecified: Secondary | ICD-10-CM | POA: Diagnosis present

## 2018-07-06 DIAGNOSIS — N183 Chronic kidney disease, stage 3 unspecified: Secondary | ICD-10-CM | POA: Diagnosis present

## 2018-07-06 DIAGNOSIS — I482 Chronic atrial fibrillation, unspecified: Secondary | ICD-10-CM

## 2018-07-06 DIAGNOSIS — J9601 Acute respiratory failure with hypoxia: Secondary | ICD-10-CM

## 2018-07-06 DIAGNOSIS — I5022 Chronic systolic (congestive) heart failure: Secondary | ICD-10-CM

## 2018-07-06 DIAGNOSIS — I6602 Occlusion and stenosis of left middle cerebral artery: Secondary | ICD-10-CM | POA: Diagnosis present

## 2018-07-06 DIAGNOSIS — I63512 Cerebral infarction due to unspecified occlusion or stenosis of left middle cerebral artery: Secondary | ICD-10-CM

## 2018-07-06 DIAGNOSIS — I63312 Cerebral infarction due to thrombosis of left middle cerebral artery: Secondary | ICD-10-CM

## 2018-07-06 DIAGNOSIS — I639 Cerebral infarction, unspecified: Secondary | ICD-10-CM

## 2018-07-06 DIAGNOSIS — I63412 Cerebral infarction due to embolism of left middle cerebral artery: Secondary | ICD-10-CM

## 2018-07-06 DIAGNOSIS — J410 Simple chronic bronchitis: Secondary | ICD-10-CM

## 2018-07-06 DIAGNOSIS — E875 Hyperkalemia: Secondary | ICD-10-CM | POA: Diagnosis present

## 2018-07-06 DIAGNOSIS — Z978 Presence of other specified devices: Secondary | ICD-10-CM

## 2018-07-06 DIAGNOSIS — R7303 Prediabetes: Secondary | ICD-10-CM | POA: Diagnosis present

## 2018-07-06 HISTORY — PX: IR PERCUTANEOUS ART THROMBECTOMY/INFUSION INTRACRANIAL INC DIAG ANGIO: IMG6087

## 2018-07-06 HISTORY — PX: IR CT HEAD LTD: IMG2386

## 2018-07-06 LAB — HEMOGLOBIN A1C
Hgb A1c MFr Bld: 5.6 % (ref 4.8–5.6)
Mean Plasma Glucose: 114.02 mg/dL

## 2018-07-06 LAB — CBC WITH DIFFERENTIAL/PLATELET
ABS IMMATURE GRANULOCYTES: 0.03 10*3/uL (ref 0.00–0.07)
BASOS ABS: 0 10*3/uL (ref 0.0–0.1)
BASOS PCT: 0 %
Eosinophils Absolute: 0 10*3/uL (ref 0.0–0.5)
Eosinophils Relative: 0 %
HCT: 34.8 % — ABNORMAL LOW (ref 36.0–46.0)
HEMOGLOBIN: 11.3 g/dL — AB (ref 12.0–15.0)
IMMATURE GRANULOCYTES: 0 %
LYMPHS PCT: 22 %
Lymphs Abs: 2.1 10*3/uL (ref 0.7–4.0)
MCH: 30.7 pg (ref 26.0–34.0)
MCHC: 32.5 g/dL (ref 30.0–36.0)
MCV: 94.6 fL (ref 80.0–100.0)
MONO ABS: 0.7 10*3/uL (ref 0.1–1.0)
Monocytes Relative: 7 %
NEUTROS ABS: 7 10*3/uL (ref 1.7–7.7)
NEUTROS PCT: 71 %
PLATELETS: 297 10*3/uL (ref 150–400)
RBC: 3.68 MIL/uL — AB (ref 3.87–5.11)
RDW: 13.8 % (ref 11.5–15.5)
WBC: 9.9 10*3/uL (ref 4.0–10.5)
nRBC: 0 % (ref 0.0–0.2)

## 2018-07-06 LAB — BASIC METABOLIC PANEL
ANION GAP: 5 (ref 5–15)
BUN: 9 mg/dL (ref 8–23)
CO2: 24 mmol/L (ref 22–32)
CREATININE: 0.89 mg/dL (ref 0.44–1.00)
Calcium: 8.7 mg/dL — ABNORMAL LOW (ref 8.9–10.3)
Chloride: 111 mmol/L (ref 98–111)
GLUCOSE: 101 mg/dL — AB (ref 70–99)
Potassium: 3.8 mmol/L (ref 3.5–5.1)
Sodium: 140 mmol/L (ref 135–145)

## 2018-07-06 LAB — POCT I-STAT 3, ART BLOOD GAS (G3+)
Acid-base deficit: 1 mmol/L (ref 0.0–2.0)
Bicarbonate: 25.1 mmol/L (ref 20.0–28.0)
O2 Saturation: 99 %
PCO2 ART: 48.4 mmHg — AB (ref 32.0–48.0)
PH ART: 7.324 — AB (ref 7.350–7.450)
TCO2: 27 mmol/L (ref 22–32)
pO2, Arterial: 174 mmHg — ABNORMAL HIGH (ref 83.0–108.0)

## 2018-07-06 LAB — GLUCOSE, CAPILLARY
GLUCOSE-CAPILLARY: 129 mg/dL — AB (ref 70–99)
GLUCOSE-CAPILLARY: 89 mg/dL (ref 70–99)
GLUCOSE-CAPILLARY: 79 mg/dL (ref 70–99)
GLUCOSE-CAPILLARY: 92 mg/dL (ref 70–99)
GLUCOSE-CAPILLARY: 74 mg/dL (ref 70–99)
Glucose-Capillary: 85 mg/dL (ref 70–99)

## 2018-07-06 LAB — LIPID PANEL
CHOL/HDL RATIO: 3 ratio
Cholesterol: 146 mg/dL (ref 0–200)
HDL: 49 mg/dL (ref >40)
LDL CALC: 72 mg/dL (ref 0–99)
Triglycerides: 124 mg/dL (ref <150)
VLDL: 25 mg/dL (ref 0–40)

## 2018-07-06 LAB — PHOSPHORUS: PHOSPHORUS: 3.7 mg/dL (ref 2.5–4.6)

## 2018-07-06 LAB — MAGNESIUM: Magnesium: 1.8 mg/dL (ref 1.7–2.4)

## 2018-07-06 LAB — MRSA PCR SCREENING: MRSA by PCR: NEGATIVE

## 2018-07-06 MED ORDER — ACETAMINOPHEN 325 MG PO TABS: 650.0000 mg | ORAL_TABLET | ORAL | Status: DC | PRN

## 2018-07-06 MED ORDER — IPRATROPIUM-ALBUTEROL 0.5-2.5 (3) MG/3ML IN SOLN
RESPIRATORY_TRACT | Status: AC
  Administered 2018-07-06: 3 mL via RESPIRATORY_TRACT
  Filled 2018-07-06: qty 3

## 2018-07-06 MED ORDER — CHLORHEXIDINE GLUCONATE 0.12% ORAL RINSE (MEDLINE KIT)
15.0000 mL | Freq: Two times a day (BID) | OROMUCOSAL | Status: DC
  Administered 2018-07-06 – 2018-07-07 (×3): 15 mL via OROMUCOSAL

## 2018-07-06 MED ORDER — ACETAMINOPHEN 160 MG/5ML PO SOLN: 650.0000 mg | ORAL | Status: DC | PRN

## 2018-07-06 MED ORDER — LORAZEPAM 2 MG/ML IJ SOLN
2.0000 mg | Freq: Once | INTRAMUSCULAR | Status: AC
  Administered 2018-07-06: 2 mg via INTRAVENOUS

## 2018-07-06 MED ORDER — IPRATROPIUM-ALBUTEROL 0.5-2.5 (3) MG/3ML IN SOLN
3.0000 mL | Freq: Four times a day (QID) | RESPIRATORY_TRACT | Status: DC
  Administered 2018-07-06 – 2018-07-07 (×8): 3 mL via RESPIRATORY_TRACT
  Filled 2018-07-06 (×7): qty 3

## 2018-07-06 MED ORDER — LABETALOL HCL 5 MG/ML IV SOLN
10.0000 mg | Freq: Once | INTRAVENOUS | Status: AC
  Administered 2018-07-06: 10 mg via INTRAVENOUS

## 2018-07-06 MED ORDER — ACETAMINOPHEN 160 MG/5ML PO SOLN
650.0000 mg | ORAL | Status: DC | PRN
  Administered 2018-07-06: 650 mg
  Filled 2018-07-06: qty 20.3

## 2018-07-06 MED ORDER — PROPOFOL 500 MG/50ML IV EMUL
INTRAVENOUS | Status: DC | PRN
  Administered 2018-07-06: 50 ug/kg/min via INTRAVENOUS

## 2018-07-06 MED ORDER — LORAZEPAM 2 MG/ML IJ SOLN
INTRAMUSCULAR | Status: AC
  Filled 2018-07-06: qty 1

## 2018-07-06 MED ORDER — DOCUSATE SODIUM 50 MG/5ML PO LIQD: 100.0000 mg | Freq: Two times a day (BID) | ORAL | Status: DC | PRN

## 2018-07-06 MED ORDER — ACETAMINOPHEN 650 MG RE SUPP
650.0000 mg | RECTAL | Status: DC | PRN
  Administered 2018-07-07 (×2): 650 mg via RECTAL
  Filled 2018-07-06 (×2): qty 1

## 2018-07-06 MED ORDER — CLEVIDIPINE BUTYRATE 0.5 MG/ML IV EMUL
0.0000 mg/h | INTRAVENOUS | Status: AC
  Administered 2018-07-06: 4 mg/h via INTRAVENOUS
  Administered 2018-07-06: 2 mg/h via INTRAVENOUS
  Filled 2018-07-06: qty 50

## 2018-07-06 MED ORDER — DIGOXIN 125 MCG PO TABS
0.1250 mg | ORAL_TABLET | Freq: Every day | ORAL | Status: DC
  Administered 2018-07-06 – 2018-07-09 (×4): 0.125 mg
  Filled 2018-07-06 (×5): qty 1

## 2018-07-06 MED ORDER — FENTANYL CITRATE (PF) 100 MCG/2ML IJ SOLN
50.0000 ug | INTRAMUSCULAR | Status: DC | PRN
  Filled 2018-07-06: qty 2

## 2018-07-06 MED ORDER — ONDANSETRON HCL 4 MG/2ML IJ SOLN
INTRAMUSCULAR | Status: DC | PRN
  Administered 2018-07-06: 4 mg via INTRAVENOUS

## 2018-07-06 MED ORDER — FENTANYL CITRATE (PF) 100 MCG/2ML IJ SOLN
50.0000 ug | INTRAMUSCULAR | Status: DC | PRN
  Administered 2018-07-06: 50 ug via INTRAVENOUS

## 2018-07-06 MED ORDER — MONTELUKAST SODIUM 10 MG PO TABS
10.0000 mg | ORAL_TABLET | Freq: Every day | ORAL | Status: DC
  Administered 2018-07-08 – 2018-07-09 (×2): 10 mg
  Filled 2018-07-06 (×2): qty 1

## 2018-07-06 MED ORDER — NICARDIPINE HCL IN NACL 20-0.86 MG/200ML-% IV SOLN
INTRAVENOUS | Status: DC | PRN
  Administered 2018-07-06: 5 mg/h via INTRAVENOUS

## 2018-07-06 MED ORDER — INSULIN ASPART 100 UNIT/ML ~~LOC~~ SOLN: 0.0000 [IU] | Freq: Three times a day (TID) | SUBCUTANEOUS | Status: DC

## 2018-07-06 MED ORDER — INSULIN ASPART 100 UNIT/ML ~~LOC~~ SOLN
0.0000 [IU] | SUBCUTANEOUS | Status: DC
  Administered 2018-07-06 – 2018-07-09 (×3): 2 [IU] via SUBCUTANEOUS

## 2018-07-06 MED ORDER — CLEVIDIPINE BUTYRATE 0.5 MG/ML IV EMUL
0.0000 mg/h | INTRAVENOUS | Status: DC
  Filled 2018-07-06: qty 50

## 2018-07-06 MED ORDER — FAMOTIDINE IN NACL 20-0.9 MG/50ML-% IV SOLN
20.0000 mg | Freq: Two times a day (BID) | INTRAVENOUS | Status: DC
  Administered 2018-07-06 – 2018-07-07 (×4): 20 mg via INTRAVENOUS
  Filled 2018-07-06 (×4): qty 50

## 2018-07-06 MED ORDER — PROPOFOL 1000 MG/100ML IV EMUL
0.0000 ug/kg/min | INTRAVENOUS | Status: DC
  Administered 2018-07-06: 30 ug/kg/min via INTRAVENOUS
  Administered 2018-07-06: 50 ug/kg/min via INTRAVENOUS
  Administered 2018-07-06: 35 ug/kg/min via INTRAVENOUS
  Administered 2018-07-07: 15 ug/kg/min via INTRAVENOUS
  Filled 2018-07-06 (×5): qty 100

## 2018-07-06 MED ORDER — BUDESONIDE 0.5 MG/2ML IN SUSP
0.5000 mg | Freq: Two times a day (BID) | RESPIRATORY_TRACT | Status: DC
  Administered 2018-07-06 – 2018-07-10 (×9): 0.5 mg via RESPIRATORY_TRACT
  Filled 2018-07-06 (×9): qty 2

## 2018-07-06 MED ORDER — ACETAMINOPHEN 650 MG RE SUPP: 650.0000 mg | RECTAL | Status: DC | PRN

## 2018-07-06 MED ORDER — ALBUTEROL SULFATE (2.5 MG/3ML) 0.083% IN NEBU: 2.5000 mg | INHALATION_SOLUTION | RESPIRATORY_TRACT | Status: DC | PRN

## 2018-07-06 MED ORDER — ORAL CARE MOUTH RINSE
15.0000 mL | OROMUCOSAL | Status: DC
  Administered 2018-07-06 – 2018-07-07 (×13): 15 mL via OROMUCOSAL

## 2018-07-06 MED ORDER — SODIUM CHLORIDE 0.9 % IV SOLN
INTRAVENOUS | Status: DC | PRN
  Administered 2018-07-06: 500 mL via INTRAVENOUS

## 2018-07-06 MED ORDER — FENTANYL CITRATE (PF) 100 MCG/2ML IJ SOLN
INTRAMUSCULAR | Status: DC | PRN
  Administered 2018-07-06 (×4): 25 ug via INTRAVENOUS

## 2018-07-06 MED ORDER — DIGOXIN 125 MCG PO TABS: 0.1250 mg | ORAL_TABLET | Freq: Every day | ORAL | Status: DC

## 2018-07-06 MED ORDER — SODIUM CHLORIDE 0.9 % IV SOLN
INTRAVENOUS | Status: DC
  Administered 2018-07-06 – 2018-07-08 (×4): via INTRAVENOUS

## 2018-07-06 NOTE — Transfer of Care (Signed)
Immediate Anesthesia Transfer of Care Note  Patient: Kaitlyn Underwood  Procedure(s) Performed: RADIOLOGY WITH ANESTHESIA (N/A )  Patient Location: ICU  Anesthesia Type:General  Level of Consciousness: sedated and Patient remains intubated per anesthesia plan  Airway & Oxygen Therapy: Patient remains intubated per anesthesia plan and Patient placed on Ventilator (see vital sign flow sheet for setting)  Post-op Assessment: Report given to RN and Post -op Vital signs reviewed and stable  Post vital signs: stable  Last Vitals:  Vitals Value Taken Time  BP 127/66 07/06/2018  1:49 AM  Temp    Pulse 85 07/06/2018  1:57 AM  Resp 17 07/06/2018  1:57 AM  SpO2 99 % 07/06/2018  1:57 AM  Vitals shown include unvalidated device data.  Last Pain: There were no vitals filed for this visit.       Complications: No apparent anesthesia complications

## 2018-07-06 NOTE — Progress Notes (Signed)
Pt having rhythmic tremors in bilateral upper and lower extremities. Paged Dr. Cheral Marker, awaiting call back.

## 2018-07-06 NOTE — H&P (Signed)
Neurology H&P  CC: Aphasia  History is obtained from: Patient  HPI: Kaitlyn Underwood is a 77 y.o. female with a history of hypertension, hyperlipidemia who presents with right-sided weakness and aphasia.  She was last known well was sometime between 530 and 6 PM.  She presented to [facility] densely aphasic and was treated with IV TPA.  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  She was transferred to [facility] for emergent thrombectomy.  There was difficulty establishing reperfusion, but on the third pass tici 3 flow was obtained.   LKW: 5:30 PM tpa given?: no, outside of window Modified Rankin Scale: 0-Completely asymptomatic and back to baseline post- stroke  ROS:  Unable to obtain due to altered mental status.   Past Medical History:  Diagnosis Date  . Pre-diabetes      Family History  Problem Relation Age of Onset  . Arrhythmia Sister   . Heart disease Brother   . Heart attack Maternal Uncle      Social History:  reports that she quit smoking about 28 years ago. Her smoking use included cigarettes. She has a 20.00 pack-year smoking history. She has never used smokeless tobacco. She reports that she does not drink alcohol or use drugs.   Exam: Current vital signs: There were no vitals filed for this visit.  Vital signs in last 24 hours:   Physical Exam  Constitutional: Appears elderly Psych: Affect appropriate to situation Eyes: No scleral injection HENT: No OP obstrucion Head: Normocephalic.  Cardiovascular: Normal rate and regular rhythm.  Respiratory: Effort normal and breath sounds normal to anterior ascultation GI: Soft.  No distension. There is no tenderness.  Skin: WDI  Neuro: Mental Status: Patient is awake, but densely aphasic, when I asked her to stick out her tongue she opens her mouth, but does not reliably follow commands. Cranial Nerves: II: She blinks to threat from the left but not  from the right.  Pupils are equal, round, and reactive to light.   III,IV, VI: She has a left gaze preference but does cross midline to the right VII: Facial movement with right facial weakness VIII: hearing is intact to voice Motor: She has 5/5 strength of the left arm and leg, 4-/5 strength in the right leg and 3/5 strength in the right arm Sensory: She responds less to noxious stimulation on the right than the left  Cerebellar: She does not perform  I have reviewed labs in epic and the results pertinent to this consultation are:   I have reviewed the images obtained: CT/CTA-occlusion of a substantial vessel in the left MCA distribution  Primary Diagnosis:  Cerebral infarction due to embolism of left middle cerebral artery  Secondary Diagnosis: Chronic systolic (congestive) heart failure   Impression: 77 year old female with a history of hypertension, hyperlipidemia who presents with left M2 occlusion.  She received IV TPA and was transferred to [facility] for emergent thrombectomy.  Possible etiologies include artery to artery embolization versus cardioembolic.    Recommendations: - HgbA1c, fasting lipid panel - MRI of the brain without contrast - Frequent neuro checks - Echocardiogram - Prophylactic therapy-none for 24 hours - Risk factor modification - Telemetry monitoring - PT consult, OT consult, Speech consult - Stroke team to follow  -Continue home digoxin -SSI  This patient is critically ill and at significant risk of neurological worsening, death and care requires constant monitoring of vital signs, hemodynamics,respiratory and cardiac  monitoring, neurological assessment, discussion with family, other specialists and medical decision making of high complexity. I spent 50 minutes of neurocritical care time  in the care of  this patient.  Roland Rack, MD [facility] [phone]  If 7pm- 7am, please page neurology on call as listed in  [place]. 07/06/2018  1:00 AM

## 2018-07-06 NOTE — Progress Notes (Signed)
Patient ID: Kaitlyn Underwood, female   DOB: 04-Sep-1940, 77 y.o.   MRN: [id number] INR. Post treatment patient left intubated with an  8 F sheath because of IV tpa. Dyna CT shows a centimeter LT putamen hyperdensity and a smaller just post and cranial to it. No gross mass effect ao hemorrhage seen. Pupils 2 mm Rt = Lt. RT groin soft. No hematoma.Distal pulses dopplerabl DPand PT on the RT, and palpable DP and dopplerable PT on the left.Marland Kitchen S.Braedon Sjogren MD

## 2018-07-06 NOTE — Progress Notes (Signed)
SLP Cancellation Note  Patient Details Name: Kaitlyn Underwood MRN: [id number] DOB: 01-12-1941   Cancelled treatment:       Reason Eval/Treat Not Completed: Medical issues which prohibited therapy   Juan Quam Laurice 07/06/2018, 4:27 PM

## 2018-07-06 NOTE — Progress Notes (Signed)
STROKE TEAM PROGRESS NOTE   SUBJECTIVE (INTERVAL HISTORY) Her daughters are at the bedside. Pt still intubated on sedation, not responsive. Just had femoral sheath removed. Pending MRI. Had episode of diffuse upper extremity tremor. EEG negative. Received dose of ativan. Currently tremor resolved.    OBJECTIVE Temp:  [98.4 F (36.9 C)-99.1 F (37.3 C)] 99.1 F (37.3 C) (11/18 0755) Pulse Rate:  [42-95] 79 (11/18 1145) Cardiac Rhythm: Atrial fibrillation (11/18 0800) Resp:  [14-23] 14 (11/18 1145) BP: (92-153)/(49-120) 132/64 (11/18 1145) SpO2:  [97 %-100 %] 100 % (11/18 1145) Arterial Line BP: (98-175)/(47-88) 129/74 (11/18 1115) FiO2 (%):  [40 %-80 %] 40 % (11/18 1124) Weight:  [90.7 kg] 90.7 kg (11/18 0208)  Recent Labs  Lab 07/06/18 0351 07/06/18 0811 07/06/18 1141  GLUCAP 129* 89 85   Recent Labs  Lab 07/06/18 0703  NA 140  K 3.8  CL 111  CO2 24  GLUCOSE 101*  BUN 9  CREATININE 0.89  CALCIUM 8.7*  MG 1.8  PHOS 3.7   No results for input(s): AST, ALT, ALKPHOS, BILITOT, PROT, ALBUMIN in the last 168 hours. Recent Labs  Lab 07/06/18 0703  WBC 9.9  NEUTROABS 7.0  HGB 11.3*  HCT 34.8*  MCV 94.6  PLT 297   No results for input(s): CKTOTAL, CKMB, CKMBINDEX, TROPONINI in the last 168 hours. No results for input(s): LABPROT, INR in the last 72 hours. No results for input(s): COLORURINE, LABSPEC, [place], GLUCOSEU, HGBUR, BILIRUBINUR, KETONESUR, PROTEINUR, UROBILINOGEN, NITRITE, LEUKOCYTESUR in the last 72 hours.  Invalid input(s): APPERANCEUR     Component Value Date/Time   CHOL 146 07/06/2018 0704   CHOL 189 05/12/2018 1212   TRIG 124 07/06/2018 0704   HDL 49 07/06/2018 0704   HDL 63 05/12/2018 1212   CHOLHDL 3.0 07/06/2018 0704   VLDL 25 07/06/2018 0704   LDLCALC 72 07/06/2018 0704   LDLCALC 103 (H) 05/12/2018 1212   Lab Results  Component Value Date   HGBA1C 5.6 07/06/2018   No results found for: LABOPIA, COCAINSCRNUR, LABBENZ, AMPHETMU, THCU,  LABBARB  No results for input(s): ETH in the last 168 hours.  I have personally reviewed the radiological images below and agree with the radiology interpretations.  Portable Chest X-ray  Result Date: 07/06/2018 CLINICAL DATA:  Endotracheal intubation EXAM: PORTABLE CHEST 1 VIEW COMPARISON:  None. FINDINGS: Endotracheal tube tip is at the level of the clavicular heads. There is moderate cardiomegaly without pulmonary edema. No pneumothorax. No focal airspace consolidation. IMPRESSION: 1. Endotracheal tube tip at the level of the clavicular heads. 2. Cardiomegaly. Electronically Signed   By: Ulyses Jarred M.D.   On: 07/06/2018 02:24   Dg Abd Portable 1v  Result Date: 07/06/2018 CLINICAL DATA:  Orogastric tube placement EXAM: PORTABLE ABDOMEN - 1 VIEW COMPARISON:  None. FINDINGS: OG tube tip and side port project over the midline upper abdomen. The tip is in the expected location of the gastroduodenal junction. IMPRESSION: OG tube tip in the expected location of the gastroduodenal junction. Electronically Signed   By: Ulyses Jarred M.D.   On: 07/06/2018 02:23   MRI and MRA pending   PHYSICAL EXAM  Temp:  [98.4 F (36.9 C)-99.1 F (37.3 C)] 99.1 F (37.3 C) (11/18 0755) Pulse Rate:  [42-95] 79 (11/18 1145) Resp:  [14-23] 14 (11/18 1145) BP: (92-153)/(49-120) 132/64 (11/18 1145) SpO2:  [97 %-100 %] 100 % (11/18 1145) Arterial Line BP: (98-175)/(47-88) 129/74 (11/18 1115) FiO2 (%):  [40 %-80 %] 40 % (11/18 1124)  Weight:  [90.7 kg] 90.7 kg (11/18 0208)  General - Well nourished, well developed, intubated on sedation.  Ophthalmologic - fundi not visualized due to noncooperation.  Cardiovascular - irregularly irregular heart rate and rhythm.  Neuro - intubated on sedation, not responsive, not following commands. Eyes closed but with forced opening, eye mid position, no doll's eyes. No corneal bilaterally but positive gag and cough. Breathing over the vent. Not blinking to visual threat  bilaterally, papillary reflex sluggish, pupil equal size. Facial symmetry not able to test due to ET tube. Tongue midline in mouth. On pain stimulation, LUE and LLE no movement, RUE and RLE mild withdraw. Sensation, coordination and gait not tested.   ASSESSMENT/PLAN Kaitlyn Underwood is a 77 y.o. female with history of afib not on AC, CAD, HTN, HLD, DM and obsity admitted for aphasia and right sided weakness. TPA given and s/p thrombectomy.    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   Ongoing aggressive stroke risk factor management  Therapy recommendations:  pending  Disposition:  Pending  Respiratory failure  Intubated on vent  CCM on board  Wean off vent as able  Bilateral tremor?  EEG negative for seizure  Received ativan one dose  Currently sedated without tremor  Continue to monitor  Chronic afib on ASA   Followed with Dr. Geraldo Pitter  Not on [facility] due to back surgery with unsteady gait  On ASA 325 PTA  Rate controlled  On digoxin and metoprolol PTA  Diabetes  HgbA1c 5.6 goal < 7.0  Controlled  CBG monitoring  SSI  Hypertension . Stable . BP goal 120-140 post procedure  Long term BP goal normotensive  Hyperlipidemia  Home meds:  none   LDL 72, goal < 70  Consider statin on discharge  Other Stroke Risk Factors  Advanced age  Obesity, Body mass index is 36.58 kg/m.   Coronary artery disease  Other Active Problems    Hospital day # 1  This patient is critically ill due to left MCA infarct, s/p tPA and IR, respiratory failure, afib not on AC and at significant  risk of neurological worsening, death form recurrent stroke, hemorrhagic conversion, seizure, heart failure, cardiac arrest. This patient's care requires constant monitoring of vital signs, hemodynamics, respiratory and cardiac monitoring, review of multiple databases, neurological assessment, discussion with family, other specialists and medical decision making of high complexity. I spent 45 minutes of neurocritical care time in the care of this patient. I had long discussion with daughters at bedside, updated pt current condition, treatment plan and potential prognosis. They expressed understanding and appreciation.    Kaitlyn Hawking, Kaitlyn Underwood Stroke Neurology 07/06/2018 12:59 PM    To contact Stroke Continuity provider, please refer to [email or web address] After hours, contact General Neurology

## 2018-07-06 NOTE — Plan of Care (Signed)
Pt intubated and sedated. Propofol titrated to RASS -1. Pt not interactive, unable to educate on disease/condition at this time.

## 2018-07-06 NOTE — Progress Notes (Signed)
EEG preliminarily reviewed. There is diffuse background slowing and beta activity consistent with sedation. No electrographic seizures are seen.   Final interpretation of EEG by Dr. Doy Mince is pending.   Electronically signed: Dr. Kerney Elbe

## 2018-07-06 NOTE — Progress Notes (Signed)
STAT EEG Completed; Results Pending. Dr Cheral Marker paged.

## 2018-07-06 NOTE — Sedation Documentation (Signed)
Groin and pulses assess at bedside upon arrival to 4N see flow sheet.

## 2018-07-06 NOTE — Progress Notes (Signed)
PT Cancellation Note  Patient Details Name: Kaitlyn Underwood MRN: [id number] DOB: 03-14-1941   Cancelled Treatment:    Reason Eval/Treat Not Completed: Patient not medically ready. Pt intubated, sedated, and has groin sheath on bedrest. Acute PT to return as able as appropriate.  [name], PT, DPT Acute Rehabilitation Services Pager #: [phone] Office #: [phone]    Berline Lopes 07/06/2018, 9:06 AM

## 2018-07-06 NOTE — Procedures (Signed)
ELECTROENCEPHALOGRAM REPORT   Patient: Kaitlyn Underwood       Room #: 9M42A EEG No. ID: [id number] Age: 77 y.o.        Sex: female Referring Physician: Erlinda Hong Report Date:  07/06/2018        Interpreting Physician: Alexis Goodell  History: [name] is an 77 y.o. female with tremors noted s/p CVA  Medications:  Lanoxin, Pepcid, Insulin, Singulair, Cleviprex, Propofol  Conditions of Recording:  This is a 21 channel routine scalp EEG performed with bipolar and monopolar montages arranged in accordance to the international 10/20 system of electrode placement. One channel was dedicated to EKG recording.  The patient is in the intubated and sedated state.  Description:  The background activity is slow and poorly organized.  There are noted superimposed symmetrical sleep spindles and vertex central sharp transients.  On occasions the patient appears to lighten and during those periods the background is more consistent with drowse.  The background remains slow and irregular with low voltage theta and beta activity noted. No epileptiform activity is noted.  Hyperventilation and intermittent photic stimulation were not performed.   IMPRESSION: This EEG is characterized by slowing and is consistent with normal drowse and sleep.  No epileptiform activity is noted.  No episodes of tremor were noted.       Alexis Goodell, MD Neurology [phone] 07/06/2018, 11:00 AM

## 2018-07-06 NOTE — Progress Notes (Signed)
OT Cancellation Note  Patient Details Name: Kaitlyn Underwood MRN: [id number] DOB: 1941-02-18   Cancelled Treatment:    Reason Eval/Treat Not Completed: Patient not medically ready  Myrene Buddy Kamyiah Colantonio, OT/L   Acute OT Clinical Specialist Acute Rehabilitation Services Pager [phone] Office [phone]  07/06/2018, 8:40 AM

## 2018-07-06 NOTE — Procedures (Signed)
S/P Lt common carotid arteriogram floowed by complete revascularization of of occluded Lt MCA sup division distal m2/m3 junction with x2 passes with trevoprovue 65mm x 20 mm retriver device and x1 pass with the embotrap 5 mm x 33 mm retriever device achieving a TICI 3 revascularization.

## 2018-07-06 NOTE — Progress Notes (Signed)
Called to the floor for tremor diffusely, possibly seizure.   BP (!) 115/54   Pulse 85   Temp 99.1 F (37.3 C) (Axillary)   Resp 16   Ht 5\' 2"  (1.575 m)   Wt 90.7 kg   SpO2 99%   BMI 36.58 kg/m   General: Intubated and sedated. Diffuse tremor noted.  Neurological: Ment: Sedated. Will move head slightly to and fro in response to sternal rub. Not opening eyes or responding to commands.  CN: PERRL. Eyes deviated to the left with roving conjugate EOM to and slightly past the midline to the right. Doll's eye intact. Face with flaccid tone.  Motor/Sensory: Withdraws BLE to noxious without gross asymmetry. Minimal movement of upper extremities to noxious. Diffuse tremor to upper extremities bilaterally. No myoclonus or jerking noted.  Reflexes: 2+ patellae bilaterally  A/R: 77 year old female with left MCA stroke, s/p tPA and endovascular treatment. Now with diffuse upper extremity tremor. Unclear etiology for the tremor. Does not appear likely to be due to seizure but will need to rule out with EEG.  1. EEG 2. 2 mg IV Ativan administered.  3. Will sign out to AM stroke team.   Electronically signed: Dr. Kerney Elbe

## 2018-07-06 NOTE — Progress Notes (Signed)
NAME:  Kaitlyn Underwood, MRN:  [id number], DOB:  1940-10-13, LOS: 1 ADMISSION DATE:  07/05/2018, CONSULTATION DATE:  07/06/2018 REFERRING MD:  Estanislado Pandy - Neuroradiology, CHIEF COMPLAINT:  CVA   Brief History   77 year old woman with aphasia and right sided weakness. Received TPA and underwent M2 thrombectomy.  Interim history/subjective:  Periodically agitated and hypertensive when sedation lightened  Objective   Blood pressure (!) 159/100, pulse 79, temperature 98.7 F (37.1 C), temperature source Axillary, resp. rate 18, height 5\' 2"  (1.575 m), weight 90.7 kg, SpO2 100 %.    Vent Mode: PRVC FiO2 (%):  [40 %-80 %] 40 % Set Rate:  [16 bmp] 16 bmp Vt Set:  [400 mL] 400 mL PEEP:  [5 cmH20] 5 cmH20 Plateau Pressure:  [11 cmH20-18 cmH20] 14 cmH20   Intake/Output Summary (Last 24 hours) at 07/06/2018 1827 Last data filed at 07/06/2018 1800 Gross per 24 hour  Intake 1453.14 ml  Output 500 ml  Net 953.14 ml   Filed Weights   07/06/18 0208  Weight: 90.7 kg    Examination: General: obese HENT: ETT, OGT in place Lungs: Chest clear, no asynchrony Cardiovascular: HS normal Abdomen: soft Extremities: No groin hematoma Neuro: No response to pain on sedation GU: Normal urine output   Assessment & Plan:  Critically ill due to respiratory failure from inability to protect airway. Status post stroke revascularization  Plan Wean sedation overnight. SBT in am. Secondary stroke work up per neurology.  Best practice:  Diet: NPO Pain/Anxiety/Delirium protocol (if indicated): dexmedetomidine VAP protocol (if indicated): bundle DVT prophylaxis: heparin [place] GI prophylaxis: famotidine  Glucose control:  Phase 1 Mobility: Bedrest Code Status: Full Family Communication: No update Disposition:   Labs   CBC: Recent Labs  Lab 07/06/18 0703  WBC 9.9  NEUTROABS 7.0  HGB 11.3*  HCT 34.8*  MCV 94.6  PLT 509    Basic Metabolic Panel: Recent Labs  Lab 07/06/18 0703  NA 140    K 3.8  CL 111  CO2 24  GLUCOSE 101*  BUN 9  CREATININE 0.89  CALCIUM 8.7*  MG 1.8  PHOS 3.7   GFR: Estimated Creatinine Clearance: 56.3 mL/min (by C-G formula based on SCr of 0.89 mg/dL). Recent Labs  Lab 07/06/18 0703  WBC 9.9    Liver Function Tests: No results for input(s): AST, ALT, ALKPHOS, BILITOT, PROT, ALBUMIN in the last 168 hours. No results for input(s): LIPASE, AMYLASE in the last 168 hours. No results for input(s): AMMONIA in the last 168 hours.  ABG    Component Value Date/Time   PHART 7.324 (L) 07/06/2018 0224   PCO2ART 48.4 (H) 07/06/2018 0224   PO2ART 174.0 (H) 07/06/2018 0224   HCO3 25.1 07/06/2018 0224   TCO2 27 07/06/2018 0224   ACIDBASEDEF 1.0 07/06/2018 0224   O2SAT 99.0 07/06/2018 0224     Coagulation Profile: No results for input(s): INR, PROTIME in the last 168 hours.  Cardiac Enzymes: No results for input(s): CKTOTAL, CKMB, CKMBINDEX, TROPONINI in the last 168 hours.  HbA1C: Hgb A1c MFr Bld  Date/Time Value Ref Range Status  07/06/2018 07:01 AM 5.6 4.8 - 5.6 % Final    Comment:    (NOTE) Pre diabetes:          5.7%-6.4% Diabetes:              >6.4% Glycemic control for   <7.0% adults with diabetes     CBG: Recent Labs  Lab 07/06/18 0351 07/06/18 3267  07/06/18 1141 07/06/18 1531  GLUCAP 129* 89 85 79    Critical care time: 30 mins    Kipp Brood, MD [facility] ICU Physician [place]  Pager: [phone] Mobile: [phone] After hours: [phone].

## 2018-07-06 NOTE — Consult Note (Addendum)
NAME:  Kaitlyn Underwood, MRN:  [id number], DOB:  [birth year], LOS: 1 ADMISSION DATE:  07/05/2018, CONSULTATION DATE:  07/06/2018 REFERRING MD:  Dr. Leonel Ramsay, CHIEF COMPLAINT:  Aphasia   Brief History   42 yoF presented to Kaitlyn Underwood with aphasia and right sided weakness.  LKW between 4174 on 11/18 acute hypertensive requiring cardene.  CTA showed acute left M2 occlusion.  Treated with TPA and transferred to Kaitlyn Underwood for mechanical thrombectomy s/p complete revascularization.  Returns to ICU intubated.  History of present illness   HPI obtained from medical chart review as patient is intubated and sedated on mechanical ventilation.   77 year old female with past medical history of hypertension, systolic HF, DM and hyperlipidemia who presented to Kaitlyn Underwood with aphasia and right sided weakness.  Last known well was between 1730 -1800 on 11/18.  CTA showed acute left M2 occlusion.  Treated with TPA at 1926 and transferred to Kaitlyn Underwood for mechanical thrombectomy s/p TICI 3 revascularization on 3rd pass.  Patient intubated for procedure and to remain intubated overnight.  PCCM consulted for further ventilator management in ICU.  Past Medical History  Former smoker, HTN, CAD, heart failure, Afib (on full dose ASA only), HLD, COPD, DM, arthritis, anxiety, hypothyroidism, ?kidney cancer  Kaitlyn Underwood Events   11/17 presented to Kaitlyn Underwood, [place], tx Kaitlyn Underwood  Consults:   Procedures:  11/17 Cerebral angiogram with mechanical thrombectomy x 3 passes with TICI 3 revascularization 11/17 ETT >> 11/17 left radial Aline >> 11/17 R femoral sheath >>  Significant Diagnostic Tests:  CTH/ CTA at OSH  Micro Data:  11/18 MRSA PCR >>  Antimicrobials:  11/17 Cefazolin pre-op   Interim history/subjective:   Objective   Height 5\' 2"  (1.575 m), SpO2 99 %.    Vent Mode: PRVC FiO2 (%):  [80 %] 80 % Set Rate:  [16 bmp] 16 bmp Vt Set:  [400 mL] 400 mL PEEP:  [5 cmH20] 5 cmH20 Plateau Pressure:   [18 cmH20] 18 cmH20  No intake or output data in the 24 hours ending 07/06/18 0200 There were no vitals filed for this visit.  Examination: General:  Elderly female sedated on mechanical ventilation HEENT: pupils 3/reactive, anicteric, ETT 7 at 22, OGT, absent upper/lower teeth Neuro:  Sedated, moving LUE spont, not f/c CV: IRIR, weak peripheral pulse, R groin sheath site soft without ecchymosis  PULM: even/non-labored on MV, breathing over set rate, scattered rhonchi Kaitlyn Underwood, obese, hypo bs Extremities: cool/ dry Skin: no rashes, bruising to arms  Resolved Underwood Problem list    Assessment & Plan:   Acute Left M2 stroke s/p TPA (at OSH at 1926) and mechanical thrombectomy w/complete revascularization P:  Per Neurology and IR cleviprex for strict SBP goal 120-140 Continue aline Neuro exams per protocol EKG and TTE ordered  Further imaging per neuro  Acute respiratory insufficiency in the post-operative setting COPD, former smoker P:  Full MV support CXR and ABG now To remain on MV overnight duonebs q 6, albuterol prn and pulmicort BID Singular per tube q hs  Hypertensive crisis P:  cleviprex as above  Afib  (on full dose ASA only) hx CAD, systolic heart failure (LHC 04/2017 35-40%), HTN P:  Tele monitoring Currently rate controlled Continue digoxin Hold home lasix, metoprolol, captopril   HLD P:  Statin per stroke team Lipid panel pending  DM P:  CBG q4, SSI mod Check HgbA1c  At risk for AKI - normal sCr at OSH P:  Monitor UOP w/purwick Bladder scan  prn  Trend renal function/ mag  Best practice:  Diet: NPO Pain/Anxiety/Delirium protocol (if indicated): propofol / prn fentanyl w/bowel regimen VAP protocol (if indicated): yes DVT prophylaxis: s/p TPA 11/17 GI prophylaxis: pepcid Glucose control: CBG and SSI Mobility: BR Code Status: Full Family Communication: No family at bedside      Disposition: ICU  Labs   CBC: No results for input(s):  WBC, NEUTROABS, HGB, HCT, MCV, PLT in the last 168 hours.  Basic Metabolic Panel: No results for input(s): NA, K, CL, CO2, GLUCOSE, BUN, CREATININE, CALCIUM, MG, PHOS in the last 168 hours. GFR: CrCl cannot be calculated (Patient's most recent lab result is older than the maximum 21 days allowed.). No results for input(s): PROCALCITON, WBC, LATICACIDVEN in the last 168 hours.  Liver Function Tests: No results for input(s): AST, ALT, ALKPHOS, BILITOT, PROT, ALBUMIN in the last 168 hours. No results for input(s): LIPASE, AMYLASE in the last 168 hours. No results for input(s): AMMONIA in the last 168 hours.  ABG No results found for: PHART, PCO2ART, PO2ART, HCO3, TCO2, ACIDBASEDEF, O2SAT   Coagulation Profile: No results for input(s): INR, PROTIME in the last 168 hours.  Cardiac Enzymes: No results for input(s): CKTOTAL, CKMB, CKMBINDEX, TROPONINI in the last 168 hours.  HbA1C: No results found for: HGBA1C  CBG: No results for input(s): GLUCAP in the last 168 hours.  Review of Systems:   Unable  Past Medical History  She,  has a past medical history of Anxiety, Atrial fibrillation (Kaitlyn Underwood), Cancer of kidney (Kaitlyn Underwood), Chronic systolic heart failure (Kaitlyn Underwood), COPD (chronic obstructive pulmonary disease) (Kaitlyn Underwood), Coronary artery disease, Hypertension, Hypothyroidism, and Pre-diabetes.   Surgical History    Past Surgical History:  Procedure Laterality Date  . LEFT HEART CATH AND CORONARY ANGIOGRAPHY N/A 04/29/2017   Procedure: LEFT HEART CATH AND CORONARY ANGIOGRAPHY;  Surgeon: Belva Crome, MD;  Location: [facility];  Service: Cardiovascular;  Laterality: N/A;  . ULTRASOUND GUIDANCE FOR VASCULAR ACCESS  04/29/2017   Procedure: Ultrasound Guidance For Vascular Access;  Surgeon: Belva Crome, MD;  Location: [facility];  Service: Cardiovascular;;     Social History   reports that she quit smoking about 28 years ago. Her smoking use included cigarettes. She has a 20.00  pack-year smoking history. She has never used smokeless tobacco. She reports that she does not drink alcohol or use drugs.   Family History   Her family history includes Arrhythmia in her sister; Heart attack in her maternal uncle; Heart disease in her brother.   Allergies Allergies  Allergen Reactions  . Sulfa Antibiotics Anaphylaxis    tongue     Home Medications  Prior to Admission medications   Medication Sig Start Date End Date Taking? Authorizing Provider  albuterol (PROVENTIL HFA;VENTOLIN HFA) 108 (90 Base) MCG/ACT inhaler Inhale 2 puffs into the lungs every 6 (six) hours as needed for wheezing or shortness of breath.     [provider]  allopurinol (ZYLOPRIM) 100 MG tablet Take 100 mg by mouth daily. 04/13/17   [provider]  Ascorbic Acid (VITAMIN C) 1000 MG tablet Take 1,000 mg by mouth 2 (two) times daily.    [provider]  aspirin EC 325 MG tablet Take 1 tablet (325 mg total) by mouth daily. 04/03/17   Revankar, Reita Cliche, MD  budesonide (PULMICORT) 0.5 MG/2ML nebulizer solution Inhale 0.5 mg into the lungs daily.    [provider]  budesonide-formoterol (SYMBICORT) 160-4.5 MCG/ACT inhaler Inhale 1  puff into the lungs daily.    [provider]  captopril (CAPOTEN) 50 MG tablet Take 1 tablet (50 mg total) by mouth 3 (three) times daily. 06/02/17   Revankar, Reita Cliche, MD  captopril (CAPOTEN) 50 MG tablet TAKE 1 TABLET BY MOUTH THREE TIMES DAILY 06/23/18   Revankar, Reita Cliche, MD  digoxin (LANOXIN) 0.125 MG tablet TAKE 1 TABLET BY MOUTH ONCE DAILY 05/04/18   Revankar, Reita Cliche, MD  furosemide (LASIX) 40 MG tablet Take 40 mg by mouth daily.    [provider]  Glucosamine Sulfate 500 MG TABS Take 500 mg by mouth daily.    [provider]  ipratropium-albuterol (DUONEB) 0.5-2.5 (3) MG/3ML SOLN  05/13/17   [provider]  metFORMIN (GLUCOPHAGE) 500 MG tablet Take 500 mg by mouth daily.    [provider]    metoprolol succinate (TOPROL-XL) 50 MG 24 hr tablet Take 1 tablet (50 mg total) by mouth daily. 05/04/18   Revankar, Reita Cliche, MD  montelukast (SINGULAIR) 10 MG tablet Take 10 mg by mouth daily.    [provider]  nitroGLYCERIN (NITROSTAT) 0.4 MG SL tablet Place 0.4 mg under the tongue every 5 (five) minutes as needed. 11/16/15   [provider]  pantoprazole (PROTONIX) 40 MG tablet Take 40 mg by mouth daily.    [provider]  traMADol (ULTRAM) 50 MG tablet Take 50 mg by mouth daily as needed for moderate pain.     [provider]  vitamin E 400 UNIT capsule Take 400 Units by mouth daily.    [provider]     Critical care time: 47 mins    Kennieth Rad, AGACNP-BC [place] Pgr: [phone] or if no answer [phone] 07/06/2018, 2:28 AM

## 2018-07-06 NOTE — Progress Notes (Signed)
Carotid artery duplex has been completed. 1-39% ICA stenosis on the right. 40-59% ICA stenosis on the left.  07/06/18 3:23 PM Kaitlyn Underwood RVT

## 2018-07-06 NOTE — Progress Notes (Signed)
RT NOTES: Patient transported to MRI on vent without incident.

## 2018-07-06 NOTE — Progress Notes (Addendum)
Referring Physician(s): CODE STROKE- Greta Doom  Supervising Physician: Luanne Bras  Patient Status:  [facility] - In-pt  Chief Complaint: None  Subjective:  Left MCA superior division distal M2/M3 junction occlusion s/p emergent mechanical thrombectomy achieving a TICI 3 revascularization 07/06/2018 by Dr. Estanislado Pandy. Patient laying in bed intubated and sedated. Accompanied by 3 female family members at bedside. Can wiggle bilateral toes but no spontaneous movements of right arm. Demonstrates right gaze preference. Right groin incision c/d/i.   Allergies: Sulfa antibiotics  Medications: Prior to Admission medications   Medication Sig Start Date End Date Taking? Authorizing Provider  albuterol (PROVENTIL HFA;VENTOLIN HFA) 108 (90 Base) MCG/ACT inhaler Inhale 2 puffs into the lungs every 6 (six) hours as needed for wheezing or shortness of breath.    Yes [provider]  Ascorbic Acid (VITAMIN C) 1000 MG tablet Take 1,000 mg by mouth 2 (two) times daily.   Yes [provider]  aspirin EC 325 MG tablet Take 1 tablet (325 mg total) by mouth daily. 04/03/17  Yes Revankar, Reita Cliche, MD  budesonide (PULMICORT) 0.5 MG/2ML nebulizer solution Take 0.5 mg by nebulization daily.    Yes [provider]  captopril (CAPOTEN) 50 MG tablet TAKE 1 TABLET BY MOUTH THREE TIMES DAILY Patient taking differently: Take 50 mg by mouth 3 (three) times daily.  06/23/18  Yes Revankar, Reita Cliche, MD  digoxin (LANOXIN) 0.125 MG tablet TAKE 1 TABLET BY MOUTH ONCE DAILY Patient taking differently: Take 0.125 mg by mouth daily.  05/04/18  Yes Revankar, Reita Cliche, MD  febuxostat (ULORIC) 40 MG tablet Take 40 mg by mouth daily. 06/30/18  Yes [provider]  furosemide (LASIX) 40 MG tablet Take 40 mg by mouth daily.   Yes [provider]  gabapentin (NEURONTIN) 300 MG capsule Take 300 mg by mouth 3 (three) times daily. 05/11/18  Yes [provider]    Glucosamine Sulfate 500 MG TABS Take 500 mg by mouth daily.   Yes [provider]  ipratropium-albuterol (DUONEB) 0.5-2.5 (3) MG/3ML SOLN Take 3 mLs by nebulization every 6 (six) hours as needed (shortness of breath/wheezing.).  05/13/17  Yes [provider]  metoprolol succinate (TOPROL-XL) 50 MG 24 hr tablet Take 1 tablet (50 mg total) by mouth daily. 05/04/18  Yes Revankar, Reita Cliche, MD  montelukast (SINGULAIR) 10 MG tablet Take 10 mg by mouth daily.   Yes [provider]  nitroGLYCERIN (NITROSTAT) 0.4 MG SL tablet Place 0.4 mg under the tongue every 5 (five) minutes as needed for chest pain.  11/16/15  Yes [provider]  pantoprazole (PROTONIX) 40 MG tablet Take 40 mg by mouth daily.   Yes [provider]  traMADol (ULTRAM) 50 MG tablet Take 50 mg by mouth daily as needed for moderate pain.    Yes [provider]  vitamin E 400 UNIT capsule Take 400 Units by mouth daily.   Yes [provider]  captopril (CAPOTEN) 50 MG tablet Take 1 tablet (50 mg total) by mouth 3 (three) times daily. Patient not taking: Reported on 07/06/2018 06/02/17   Revankar, Reita Cliche, MD     Vital Signs: BP (!) 119/56   Pulse 88   Temp 99.1 F (37.3 C) (Axillary)   Resp 18   Ht 5\' 2"  (1.575 m)   Wt 200 lb (90.7 kg)   SpO2 100%   BMI 36.58 kg/m   Physical Exam  Constitutional: She appears well-developed and well-nourished. No distress.  Intubated  and sedated.  Pulmonary/Chest: Effort normal. No respiratory distress.  Intubated and sedated.  Neurological:  Intubated and sedated. Speech and comprehension not assessed. PERRL bilaterally. Demonstrates right gaze preference. Visual fields not assessed. Facial asymmetry not assessed. Tongue protrusion not assessed. Can wiggle bilateral toes but no spontaneous movements of right arm. Pronator drift not assessed. Fine motor and coordination not assessed. Gait not assessed. Romberg not  assessed. Heel to toe not assessed. Distal pulses palpable bilaterally with Doppler.  Skin: Skin is warm and dry.  Right groin incision soft without active bleeding or hematoma.  Psychiatric:  Intubated and sedated.  Nursing note and vitals reviewed.   Imaging: Portable Chest X-ray  Result Date: 07/06/2018 CLINICAL DATA:  Endotracheal intubation EXAM: PORTABLE CHEST 1 VIEW COMPARISON:  None. FINDINGS: Endotracheal tube tip is at the level of the clavicular heads. There is moderate cardiomegaly without pulmonary edema. No pneumothorax. No focal airspace consolidation. IMPRESSION: 1. Endotracheal tube tip at the level of the clavicular heads. 2. Cardiomegaly. Electronically Signed   By: Ulyses Jarred M.D.   On: 07/06/2018 02:24   Dg Abd Portable 1v  Result Date: 07/06/2018 CLINICAL DATA:  Orogastric tube placement EXAM: PORTABLE ABDOMEN - 1 VIEW COMPARISON:  None. FINDINGS: OG tube tip and side port project over the midline upper abdomen. The tip is in the expected location of the gastroduodenal junction. IMPRESSION: OG tube tip in the expected location of the gastroduodenal junction. Electronically Signed   By: Ulyses Jarred M.D.   On: 07/06/2018 02:23    Labs:  CBC: Recent Labs    05/12/18 1212 07/06/18 0703  WBC 12.1* 9.9  HGB 13.7 11.3*  HCT 40.8 34.8*  PLT 414 297    COAGS: No results for input(s): INR, APTT in the last 8760 hours.  BMP: Recent Labs    05/12/18 1212 07/06/18 0703  NA 141 140  K 5.5* 3.8  CL 100 111  CO2 25 24  GLUCOSE 87 101*  BUN 25 9  CALCIUM 10.5* 8.7*  CREATININE 1.20* 0.89  GFRNONAA 44* >60  GFRAA 51* >60    LIVER FUNCTION TESTS: Recent Labs    05/12/18 1212  BILITOT 0.7  AST 25  ALT 19  ALKPHOS 119*  PROT 6.6  ALBUMIN 4.3    Assessment and Plan:  Left MCA superior division distal M2/M3 junction occlusion s/p emergent mechanical thrombectomy achieving a TICI 3 revascularization 07/06/2018 by Dr. Estanislado Pandy. Patient's  condition stable- remains intubated/sedated, no spontaneous movements of RUE. Right groin incision stable. Appreciate and agree with neurology management. IR to follow.   Electronically Signed: Earley Abide, PA-C 07/06/2018, 11:22 AM   I spent a total of 15 Minutes at the the patient's bedside AND on the patient's hospital floor or unit, greater than 50% of which was counseling/coordinating care for left MCA superior division distal M2/M3 junction occlusion s/p revascularization.

## 2018-07-06 NOTE — Progress Notes (Signed)
8 FR CFA sheath removed at 1015am using Exoseal and manual pressure.  Hemostasis obtained at 1030.  No complications, site reviewed with Jerene Pitch RN at bedside.  Distal pulses intact.

## 2018-07-06 NOTE — Progress Notes (Signed)
Patient ID: Kaitlyn Underwood, female   DOB: 04-Dec-1940, 77 y.o.   MRN: [id number] INR. 32 Y F RT H  lsw ? 600pm mRSS 0.  Developed acutev onset of RT sided weaknes and global aphasia . CT brain  No ICH. ASPECTS 10  CTA occluded distal LT MCA  M2/M3 superior division . Endovascular  treatment D/W patients spouse. Procedure,reasons and alternative s reviewed. Patient expressed understanding of the procedureand gave informed written consent over the phone. Risks of ICH of 10 %,worsening neuro condition ,vent dependency,death inability to revascularize and vascular injury were reviewed. S.Eren Puebla MD

## 2018-07-07 ENCOUNTER — Encounter (HOSPITAL_COMMUNITY): Payer: Self-pay | Admitting: Interventional Radiology

## 2018-07-07 ENCOUNTER — Inpatient Hospital Stay (HOSPITAL_COMMUNITY): Payer: PPO

## 2018-07-07 ENCOUNTER — Other Ambulatory Visit: Payer: Self-pay

## 2018-07-07 DIAGNOSIS — I340 Nonrheumatic mitral (valve) insufficiency: Secondary | ICD-10-CM

## 2018-07-07 LAB — BASIC METABOLIC PANEL
Anion gap: 8 (ref 5–15)
BUN: 9 mg/dL (ref 8–23)
CALCIUM: 9 mg/dL (ref 8.9–10.3)
CO2: 20 mmol/L — ABNORMAL LOW (ref 22–32)
Chloride: 114 mmol/L — ABNORMAL HIGH (ref 98–111)
Creatinine, Ser: 0.92 mg/dL (ref 0.44–1.00)
GFR calc Af Amer: >60 mL/min (ref >60)
GFR, EST NON AFRICAN AMERICAN: 59 mL/min — AB (ref >60)
Glucose, Bld: 103 mg/dL — ABNORMAL HIGH (ref 70–99)
POTASSIUM: 4.2 mmol/L (ref 3.5–5.1)
SODIUM: 142 mmol/L (ref 135–145)

## 2018-07-07 LAB — GLUCOSE, CAPILLARY
GLUCOSE-CAPILLARY: 91 mg/dL (ref 70–99)
GLUCOSE-CAPILLARY: 95 mg/dL (ref 70–99)
GLUCOSE-CAPILLARY: 93 mg/dL (ref 70–99)
Glucose-Capillary: 90 mg/dL (ref 70–99)
Glucose-Capillary: 91 mg/dL (ref 70–99)
Glucose-Capillary: 89 mg/dL (ref 70–99)

## 2018-07-07 LAB — CBC
HCT: 34.4 % — ABNORMAL LOW (ref 36.0–46.0)
Hemoglobin: 10.8 g/dL — ABNORMAL LOW (ref 12.0–15.0)
MCH: 30.6 pg (ref 26.0–34.0)
MCHC: 31.4 g/dL (ref 30.0–36.0)
MCV: 97.5 fL (ref 80.0–100.0)
PLATELETS: 284 10*3/uL (ref 150–400)
RBC: 3.53 MIL/uL — AB (ref 3.87–5.11)
RDW: 14.2 % (ref 11.5–15.5)
WBC: 12.6 10*3/uL — ABNORMAL HIGH (ref 4.0–10.5)
nRBC: 0 % (ref 0.0–0.2)

## 2018-07-07 LAB — ECHOCARDIOGRAM COMPLETE
Height: 62 in
WEIGHTICAEL: 3200 [oz_av]

## 2018-07-07 MED ORDER — ASPIRIN 81 MG PO CHEW
81.0000 mg | CHEWABLE_TABLET | Freq: Every day | ORAL | Status: DC
  Administered 2018-07-07: 81 mg
  Filled 2018-07-07: qty 1

## 2018-07-07 MED ORDER — ROSUVASTATIN CALCIUM 20 MG PO TABS
10.0000 mg | ORAL_TABLET | Freq: Every day | ORAL | Status: DC
  Administered 2018-07-08: 10 mg via ORAL
  Filled 2018-07-07: qty 1

## 2018-07-07 MED ORDER — APIXABAN 5 MG PO TABS: 5.0000 mg | ORAL_TABLET | Freq: Two times a day (BID) | ORAL | Status: DC

## 2018-07-07 MED ORDER — ASPIRIN 81 MG PO CHEW: 81.0000 mg | CHEWABLE_TABLET | Freq: Every day | ORAL | Status: DC

## 2018-07-07 MED ORDER — METOPROLOL SUCCINATE ER 50 MG PO TB24
50.0000 mg | ORAL_TABLET | Freq: Every day | ORAL | Status: DC
  Administered 2018-07-07 – 2018-07-10 (×4): 50 mg via ORAL
  Filled 2018-07-07 (×4): qty 1

## 2018-07-07 MED ORDER — IPRATROPIUM-ALBUTEROL 0.5-2.5 (3) MG/3ML IN SOLN
3.0000 mL | Freq: Three times a day (TID) | RESPIRATORY_TRACT | Status: DC
  Administered 2018-07-08 – 2018-07-09 (×6): 3 mL via RESPIRATORY_TRACT
  Filled 2018-07-07 (×6): qty 3

## 2018-07-07 MED ORDER — CHLORHEXIDINE GLUCONATE 0.12 % MT SOLN
15.0000 mL | Freq: Two times a day (BID) | OROMUCOSAL | Status: DC
  Administered 2018-07-07 – 2018-07-10 (×6): 15 mL via OROMUCOSAL
  Filled 2018-07-07 (×4): qty 15

## 2018-07-07 MED ORDER — FUROSEMIDE 10 MG/ML IJ SOLN
40.0000 mg | Freq: Once | INTRAMUSCULAR | Status: AC
  Administered 2018-07-07: 40 mg via INTRAVENOUS
  Filled 2018-07-07: qty 4

## 2018-07-07 MED ORDER — ORAL CARE MOUTH RINSE
15.0000 mL | Freq: Two times a day (BID) | OROMUCOSAL | Status: DC
  Administered 2018-07-07 – 2018-07-09 (×4): 15 mL via OROMUCOSAL

## 2018-07-07 NOTE — Progress Notes (Signed)
PT Cancellation Note  Patient Details Name: Kaitlyn Underwood MRN: [id number] DOB: 05-Mar-1941   Cancelled Treatment:    Reason Eval/Treat Not Completed: Patient not medically ready;Active bedrest order   Noted for SBTs this morning; still with active bedrest order until discontinued;   Will follow;   Roney Marion, PT  Acute Rehabilitation Services Pager [phone] Office [phone]    Kaitlyn Underwood 07/07/2018, 8:02 AM

## 2018-07-07 NOTE — Anesthesia Postprocedure Evaluation (Signed)
Anesthesia Post Note  Patient: Kaitlyn Underwood  Procedure(s) Performed: RADIOLOGY WITH ANESTHESIA (N/A )     Patient location during evaluation: SICU Anesthesia Type: General Level of consciousness: sedated and patient remains intubated per anesthesia plan Pain management: pain level controlled Vital Signs Assessment: post-procedure vital signs reviewed and stable Respiratory status: patient remains intubated per anesthesia plan Cardiovascular status: stable Anesthetic complications: no    Last Vitals:  Vitals:   07/07/18 1130 07/07/18 1300  BP: (!) 148/61 (!) 142/87  Pulse: 88 83  Resp: (!) 23 (!) 24  Temp:    SpO2: 100% 93%    Last Pain:  Vitals:   07/07/18 0800  TempSrc: Axillary                 Nolon Nations

## 2018-07-07 NOTE — Progress Notes (Signed)
OT Cancellation Note  Patient Details Name: RIMA BLIZZARD MRN: [id number] DOB: 1940/12/18   Cancelled Treatment:    Reason Eval/Treat Not Completed: Active bedrest order  [place], OT/L   Acute OT Clinical Specialist Acute Rehabilitation Services Pager [phone] Office [phone]  07/07/2018, 10:40 AM

## 2018-07-07 NOTE — Procedures (Signed)
Extubation Procedure Note  Patient Details:   Name: Kaitlyn Underwood DOB: [birth year] MRN: [id number]   Airway Documentation:  + cuff leak test prio to extubation.     Vent end date: 07/07/18 Vent end time: 0940   Evaluation  O2 sats: stable throughout Complications: No apparent complications Patient did tolerate procedure well. Bilateral Breath Sounds: Rhonchi, Diminished   No pt tries to speak, but doesn't answer questions. Pt does follow commands. No distress noted, no stridor noted.    Lenna Sciara 07/07/2018, 9:44 AM

## 2018-07-07 NOTE — Progress Notes (Signed)
STROKE TEAM PROGRESS NOTE   SUBJECTIVE (INTERVAL HISTORY) Her daughters are at the bedside. Pt was extubated and tolerating OK so far. Has mild wheezing and secretions, will give lasix 40mg  IV and decrease IVF. Pending speech evaluation. MRI showed left MCA small to moderate infarct with small petechial hemorrhage. Will start AC in 2 days.     OBJECTIVE Temp:  [98.5 F (36.9 C)-100.6 F (38.1 C)] 100.6 F (38.1 C) (11/19 0800) Pulse Rate:  [54-100] 91 (11/19 0940) Cardiac Rhythm: Atrial fibrillation (11/19 0800) Resp:  [16-23] 22 (11/19 0940) BP: (99-162)/(45-127) 148/60 (11/19 0900) SpO2:  [99 %-100 %] 100 % (11/19 0940) Arterial Line BP: (100-190)/(49-84) 135/54 (11/19 0500) FiO2 (%):  [40 %] 40 % (11/19 0747)  Recent Labs  Lab 07/06/18 1531 07/06/18 1937 07/06/18 2320 07/07/18 0337 07/07/18 0753  GLUCAP 79 92 74 90 91   Recent Labs  Lab 07/06/18 0703  NA 140  K 3.8  CL 111  CO2 24  GLUCOSE 101*  BUN 9  CREATININE 0.89  CALCIUM 8.7*  MG 1.8  PHOS 3.7   No results for input(s): AST, ALT, ALKPHOS, BILITOT, PROT, ALBUMIN in the last 168 hours. Recent Labs  Lab 07/06/18 0703  WBC 9.9  NEUTROABS 7.0  HGB 11.3*  HCT 34.8*  MCV 94.6  PLT 297   No results for input(s): CKTOTAL, CKMB, CKMBINDEX, TROPONINI in the last 168 hours. No results for input(s): LABPROT, INR in the last 72 hours. No results for input(s): COLORURINE, LABSPEC, [place], GLUCOSEU, HGBUR, BILIRUBINUR, KETONESUR, PROTEINUR, UROBILINOGEN, NITRITE, LEUKOCYTESUR in the last 72 hours.  Invalid input(s): APPERANCEUR     Component Value Date/Time   CHOL 146 07/06/2018 0704   CHOL 189 05/12/2018 1212   TRIG 124 07/06/2018 0704   HDL 49 07/06/2018 0704   HDL 63 05/12/2018 1212   CHOLHDL 3.0 07/06/2018 0704   VLDL 25 07/06/2018 0704   LDLCALC 72 07/06/2018 0704   LDLCALC 103 (H) 05/12/2018 1212   Lab Results  Component Value Date   HGBA1C 5.6 07/06/2018   No results found for: LABOPIA,  COCAINSCRNUR, LABBENZ, AMPHETMU, THCU, LABBARB  No results for input(s): ETH in the last 168 hours.  I have personally reviewed the radiological images below and agree with the radiology interpretations.  [name] FG Contrast  Result Date: 07/06/2018 CLINICAL DATA:  Follow-up examination for acute stroke. 77 year old female who presented with aphasia and right-sided weakness, status post tPA and catheter directed thrombectomy. EXAM: MRI HEAD WITHOUT CONTRAST MRA HEAD WITHOUT CONTRAST TECHNIQUE: Multiplanar, multiecho pulse sequences of the brain and surrounding structures were obtained without intravenous contrast. Angiographic images of the head were obtained using MRA technique without contrast. COMPARISON:  Prior CT and arteriogram from 07/05/2018. FINDINGS: MRI HEAD FINDINGS Brain: Generalized age-related cerebral atrophy. Patchy and confluent T2/FLAIR hyperintensity within the periventricular and deep white matter both cerebral hemispheres, most consistent with chronic small vessel ischemic disease, moderate in nature. Patchy small moderate volume restricted diffusion seen involving the left insula and overlying left frontal operculum, extending to involve the left periventricular white matter, consistent with acute left MCA territory infarct (series 3, image 26). Associated mild localized gyral swelling and edema without significant mass effect. Patchy susceptibility artifact within the area of infarction consistent with associated petechial hemorrhage (series 6, image 54). No frank hemorrhagic transformation by MRI. No other evidence for acute or subacute ischemia. Gray-white matter differentiation otherwise maintained. Few small chronic micro hemorrhages noted involving the temporal lobes bilaterally. Probable 12  mm densely calcified meningioma overlies the anterior left frontal convexity. No associated edema or mass effect. No other mass lesion. No midline shift or mass effect. No hydrocephalus.  Pituitary gland within normal limits. Vascular: Major intracranial vascular flow voids maintained. Skull and upper cervical spine: Craniocervical junction within normal limits. Bone marrow signal intensity normal. Mild soft tissue swelling noted at the left frontoparietal scalp near the vertex (series 11, image 12). Sinuses/Orbits: Globes and orbital soft tissues within normal limits. Paranasal sinuses are clear. Small left mastoid effusion, of doubtful significance. Inner ear structures grossly normal. Other: None. MRA HEAD FINDINGS ANTERIOR CIRCULATION: Distal cervical segments of the internal carotid arteries are widely patent with antegrade flow. Petrous, cavernous, and supraclinoid segments widely patent without stenosis. Origin of the ophthalmic arteries patent bilaterally. ICA termini well perfused and symmetric. A1 segments patent bilaterally. Normal anterior communicating artery. Anterior cerebral arteries widely patent to their distal aspects without stenosis. Right M1 widely patent without stenosis. Normal right MCA bifurcation. No proximal right M2 occlusion. Distal right MCA branches well perfused. Left M1 duplicated and/or bifurcates early at the origin. No M1 stenosis or occlusion. No proximal left M2 occlusion. Distal left MCA branches well perfused. POSTERIOR CIRCULATION: Vertebral arteries patent to the vertebrobasilar junction without stenosis. Right vertebral artery dominant. Posterior inferior cerebral arteries not visualized. Basilar widely patent to its distal aspect without stenosis. Superior cerebral arteries patent bilaterally. Posterior cerebral arteries well perfused to their distal aspects without stenosis. Prominent left posterior communicating artery noted. IMPRESSION: MRI HEAD IMPRESSION: 1. Small to moderate evolving acute left MCA territory infarct as above. Associated petechial hemorrhage without frank hemorrhagic transformation or significant mass effect. 2. Underlying moderate  chronic microvascular ischemic disease. MRA HEAD IMPRESSION: 1. Negative intracranial MRA. No evidence for large vessel occlusion status post catheter directed thrombectomy. No hemodynamically significant or correctable stenosis identified. 2. Duplicated left M1 segment. Electronically Signed   By: Jeannine Boga M.D.   On: 07/06/2018 19:43   Mr Brain Wo Contrast  Result Date: 07/06/2018 CLINICAL DATA:  Follow-up examination for acute stroke. 77 year old female who presented with aphasia and right-sided weakness, status post tPA and catheter directed thrombectomy. EXAM: MRI HEAD WITHOUT CONTRAST MRA HEAD WITHOUT CONTRAST TECHNIQUE: Multiplanar, multiecho pulse sequences of the brain and surrounding structures were obtained without intravenous contrast. Angiographic images of the head were obtained using MRA technique without contrast. COMPARISON:  Prior CT and arteriogram from 07/05/2018. FINDINGS: MRI HEAD FINDINGS Brain: Generalized age-related cerebral atrophy. Patchy and confluent T2/FLAIR hyperintensity within the periventricular and deep white matter both cerebral hemispheres, most consistent with chronic small vessel ischemic disease, moderate in nature. Patchy small moderate volume restricted diffusion seen involving the left insula and overlying left frontal operculum, extending to involve the left periventricular white matter, consistent with acute left MCA territory infarct (series 3, image 26). Associated mild localized gyral swelling and edema without significant mass effect. Patchy susceptibility artifact within the area of infarction consistent with associated petechial hemorrhage (series 6, image 54). No frank hemorrhagic transformation by MRI. No other evidence for acute or subacute ischemia. Gray-white matter differentiation otherwise maintained. Few small chronic micro hemorrhages noted involving the temporal lobes bilaterally. Probable 12 mm densely calcified meningioma overlies the  anterior left frontal convexity. No associated edema or mass effect. No other mass lesion. No midline shift or mass effect. No hydrocephalus. Pituitary gland within normal limits. Vascular: Major intracranial vascular flow voids maintained. Skull and upper cervical spine: Craniocervical junction within normal limits. Bone marrow signal  intensity normal. Mild soft tissue swelling noted at the left frontoparietal scalp near the vertex (series 11, image 12). Sinuses/Orbits: Globes and orbital soft tissues within normal limits. Paranasal sinuses are clear. Small left mastoid effusion, of doubtful significance. Inner ear structures grossly normal. Other: None. MRA HEAD FINDINGS ANTERIOR CIRCULATION: Distal cervical segments of the internal carotid arteries are widely patent with antegrade flow. Petrous, cavernous, and supraclinoid segments widely patent without stenosis. Origin of the ophthalmic arteries patent bilaterally. ICA termini well perfused and symmetric. A1 segments patent bilaterally. Normal anterior communicating artery. Anterior cerebral arteries widely patent to their distal aspects without stenosis. Right M1 widely patent without stenosis. Normal right MCA bifurcation. No proximal right M2 occlusion. Distal right MCA branches well perfused. Left M1 duplicated and/or bifurcates early at the origin. No M1 stenosis or occlusion. No proximal left M2 occlusion. Distal left MCA branches well perfused. POSTERIOR CIRCULATION: Vertebral arteries patent to the vertebrobasilar junction without stenosis. Right vertebral artery dominant. Posterior inferior cerebral arteries not visualized. Basilar widely patent to its distal aspect without stenosis. Superior cerebral arteries patent bilaterally. Posterior cerebral arteries well perfused to their distal aspects without stenosis. Prominent left posterior communicating artery noted. IMPRESSION: MRI HEAD IMPRESSION: 1. Small to moderate evolving acute left MCA territory  infarct as above. Associated petechial hemorrhage without frank hemorrhagic transformation or significant mass effect. 2. Underlying moderate chronic microvascular ischemic disease. MRA HEAD IMPRESSION: 1. Negative intracranial MRA. No evidence for large vessel occlusion status post catheter directed thrombectomy. No hemodynamically significant or correctable stenosis identified. 2. Duplicated left M1 segment. Electronically Signed   By: Jeannine Boga M.D.   On: 07/06/2018 19:43   [place]  Result Date: 07/07/2018 INDICATION: Global aphasia. Right-sided hemiplegia. Occluded inferior division of the left middle cerebral artery M2 M3 region EXAM: 1. EMERGENT LARGE VESSEL OCCLUSION THROMBOLYSIS (POSTERIOR CIRCULATION) MEDICATIONS: No antibiotic was administered within 1 hour of the procedure. ANESTHESIA/SEDATION: General anesthesia CONTRAST:  Isovue 300 approximately 110 mL. FLUOROSCOPY TIME:  Fluoroscopy Time: 73 minutes 24 seconds (3818 mGy). COMPLICATIONS: None immediate. TECHNIQUE: Following a full explanation of the procedure along with the potential associated complications, an informed witnessed consent was obtained from the patient's husband. The risks of intracranial hemorrhage of 10%, worsening neurological deficit, ventilator dependency, death and inability to revascularize were all reviewed in detail with the patient's husband. The patient was then put under general anesthesia by the Department of Anesthesiology at [facility]. The right groin was prepped and draped in the usual sterile fashion. Thereafter using modified Seldinger technique, transfemoral access into the right common femoral artery was obtained without difficulty. Over a 0.035 inch guidewire a 5 French Pinnacle sheath was inserted. Through this, and also over a 0.035 inch guidewire a 5 [place] JB 1 catheter was advanced to the aortic arch region and selectively positioned in the left common carotid artery. FINDINGS:  The left common carotid arteriogram demonstrates the left external carotid artery and its major branches to be widely patent. The left internal carotid artery at the bulb demonstrates approximately 60% stenosis by the NASCET criteria secondary to a smooth posterior wall atherosclerotic plaque. Distal to this the left internal carotid artery is seen to opacify to the cranial skull base. The petrous, cavernous and supraclinoid segments are widely patent. A left posterior communicating artery is seen opacifying the left posterior cerebral artery distribution. 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 The inferior division from the superior duplicated branch demonstrates complete  angiographic occlusion. The left anterior cerebral artery opacifies into the capillary and venous phases. The lateral projection of the whole head run demonstrates a large area of hypoperfusion involving the left parietal and posterior frontal regions. PROCEDURE: ENDOVASCULAR REVASCULARIZATION OF OCCLUDED INFERIOR DIVISION OF THE UPPER BRANCH OF THE LEFT MIDDLE CEREBRAL ARTERY, WITH 2 PASSES WITH THE TREVO PROVUE 3 MM X 20 MM RETRIEVAL DEVICE, AND 1 PASS WITH THE 5 MM X 33 MM EMBOTRAP RETRIEVAL DEVICE ACHIEVING A TICI 3 REVASCULARIZATION. The diagnostic JB 1 catheter in the left common carotid artery was exchanged over a 0.035 inch 300 cm Rosen exchange guidewire for an 8 French 55 cm Brite tip neurovascular sheath using biplane roadmap technique and constant fluoroscopic guidance. Good aspiration obtained from the hub of the neurovascular sheath. This was then connected to continuous heparinized saline infusion. Over the [employer] guidewire, an 8 [place] 85 cm FlowGate balloon guide catheter which had been prepped with 50% contrast and 50% heparinized saline infusion was positioned just proximal to the left common carotid bifurcation. The guidewire was removed. Good aspiration obtained from  the hub of the [facility] guide catheter. Gentle contrast injection demonstrated no evidence of spasms, dissections or of intraluminal filling defects. A combination of a Catalyst 5 French 132 cm guide catheter inside of which was an 021 Trevo ProVue microcatheter was advanced over a 0.014 inch Softip Synchro micro guidewire to the distal end of the [facility] guide catheter. With the micro guidewire leading with a J-tip configuration to avoid dissections or inducing spasm, the combination was navigated without difficulty to the supraclinoid left ICA. The superior division was then selected with the micro guidewire followed by the microcatheter. Access through the occluded inferior division was then obtained with the micro guidewire to the M2 M3 regions followed by the microcatheter. The guidewire was removed. Good aspiration obtained from the hub of the microcatheter. A gentle contrast injection demonstrated slow distal antegrade flow. The microcatheter was then connected to continuous heparinized saline infusion. A 3 mm x 20 mm Trevo ProVue retrieval device was advanced to the distal end of microcatheter. The proximal and the distal landing zones were then defined. The O ring on the delivery microcatheter was loosened. With slight forward gentle traction with the right hand on the delivery micro guidewire, with the left hand the delivery microcatheter was retrieved unsheathing the retrieval device. A control arteriogram performed through the Catalyst guide catheter in the supraclinoid left ICA demonstrated partial revascularization of the inferior division of the left middle cerebral artery. With proximal flow arrest in the left common carotid artery at the site of bifurcation, the combination of the retrieval device, the microcatheter, and the Catalyst guide catheter was retrieved as constant aspiration was applied with a 60 mL syringe at the hub of the [facility] guide catheter, and a Penumbra vacuum suction device at  the hub of the Catalyst guide catheter. The combinations were retrieved and removed. No evidence of clot was seen in the retrieval device, or in the aspirate. Aspiration was continued as the proximal flow arrest was reversed. A control arteriogram performed through the [facility] guide catheter in the left common carotid artery demonstrated no change in the occluded inferior division of the superior branch of the duplicated left MCA. A second attempt was then made again using the above combination. Again access into the distal M2 M3 region of the occluded vessel was obtained with the micro guidewire followed by the microcatheter. After having verified safe position of tip of  the microcatheter, the 3 mm x 20 mm Trevo ProVue device was again deployed as above. With proximal flow arrest in the left common carotid artery by inflating the balloon of the [facility] guide catheter, the combination of the retrieval device, microcatheter and the Catalyst guide catheter were retrieved and removed. Again aspiration was applied with a 60 mL syringe at the hub of the [facility] guide catheter, and the Penumbra suction device at the hub of the Catalyst guide catheter. Aspiration was continued as flow arrest was reversed. A control arteriogram performed again through the [facility] guide catheter in the left common carotid artery continued to demonstrate no real opacification of the inferior division of the superior branch of the duplicated left middle cerebral artery. A controlled arteriogram performed was then made using the combination of the Trevo ProVue microcatheter inside of a 5 French 132 cm Catalyst guide catheter which was advanced to the left middle cerebral artery over a 0.014 inch Softip Synchro micro guidewire. Again access into the occluded inferior division was obtained with the micro guidewire followed by the microcatheter which was positioned in the M3 region of the left middle cerebral artery. The guidewire was removed.  Again after having verified safe position of the tip of the microcatheter, a 5 mm x 33 mm Embotrap retrieval device was advanced to the distal end of the microcatheter. The proximal and the distal landing zones were defined. With proximal flow arrest in the left internal carotid artery, the combination of the retrieval device, the microcatheter, and the 5 French 132 cm Catalyst guide catheter were retrieved and removed as constant aspiration was applied with a 60 mL syringe at the hub of the [facility] guide catheter, and the Penumbra suction device at the hub of the Catalyst guide catheter. The combination was retrieved and removed. A clot was noted entangled and the cells of the retrieval device. The aspirate was continued as flow arrest was reversed by deflating the balloon in the left common carotid artery. An arteriogram performed with free back bleed of blood at the hub of the [facility] guide catheter demonstrated now complete revascularization of the previously occluded inferior division of the superior branch of the left middle cerebral artery. A control arteriogram performed centered over the whole head now demonstrated reperfusion of the previously noted hypoperfused parietal and the posterior frontal regions. A TICI 3 revascularization had been achieved of the left middle cerebral distribution. A control arteriogram performed through the [facility] guide catheter in the left common carotid artery centered over the carotid bifurcation continued to demonstrate 60% stenosis of the proximal left internal carotid artery at the bulb. No evidence of dissections or of intraluminal filling defects were noted extra cranially. The 8 [place] FlowGate guide catheter, and the 8 [place] neurovascular sheath were then removed over a J-tip guidewire for an 8 [place] Pinnacle sheath. This was then removed with the successful application of a 7 [place] Exo-Seal closure device achieving hemostasis. Distal pulses in both feet were  Dopplerable in the dorsalis pedis, and the posterior tibial regions bilaterally unchanged from prior to the beginning of the procedure. The right groin appeared soft without evidence of hematoma or bleeding. A Dyna CT of the brain performed demonstrated a focal area of hyperdensity in the subcortical insular area lateral to the putamen and a small one just cranial and lateral to this. No known gross hemorrhages, or mass effect or midline shift was noted. The patient was left intubated on account of her difficulty with comprehension prior to  the intubation. The patient was then transferred to the neuro ICU to continue with post thrombectomy management. IMPRESSION: Status post endovascular complete revascularization of occluded inferior division of the superior duplicated left middle cerebral artery with 2 passes with the 3 mm x 20 mm Trevo ProVue retrieval device, and 1 pass with the Embotrap 5 mm x 33 mm retrieval device achieving a revascularization. PLAN: Follow-up in the clinic in 4 to 6 weeks post discharge. Electronically Signed   By: Luanne Bras M.D.   On: 07/06/2018 14:56   Dg Chest Port 1 View  Result Date: 07/07/2018 CLINICAL DATA:  CHF. Hx of afib, chronic systolic heart failure, COPD, coronary artery disease, hypertension, pre-diabetes, left heart cath and coronary angiography(04/29/17). Former [phone]). EXAM: PORTABLE CHEST 1 VIEW COMPARISON:  07/06/2018 FINDINGS: Moderate enlargement of the cardiopericardial silhouette no overt edema. Atherosclerotic calcification of the aortic arch. The lungs appear otherwise clear. Degenerative glenohumeral spurring. IMPRESSION: 1. Moderate enlargement of the cardiopericardial silhouette, without edema. 2. Degenerative glenohumeral arthropathy bilaterally. Electronically Signed   By: Van Clines M.D.   On: 07/07/2018 11:48   Portable Chest X-ray  Result Date: 07/06/2018 CLINICAL DATA:  Endotracheal intubation EXAM: PORTABLE CHEST 1 VIEW  COMPARISON:  None. FINDINGS: Endotracheal tube tip is at the level of the clavicular heads. There is moderate cardiomegaly without pulmonary edema. No pneumothorax. No focal airspace consolidation. IMPRESSION: 1. Endotracheal tube tip at the level of the clavicular heads. 2. Cardiomegaly. Electronically Signed   By: Ulyses Jarred M.D.   On: 07/06/2018 02:24   Dg Abd Portable 1v  Result Date: 07/06/2018 CLINICAL DATA:  Orogastric tube placement EXAM: PORTABLE ABDOMEN - 1 VIEW COMPARISON:  None. FINDINGS: OG tube tip and side port project over the midline upper abdomen. The tip is in the expected location of the gastroduodenal junction. IMPRESSION: OG tube tip in the expected location of the gastroduodenal junction. Electronically Signed   By: Ulyses Jarred M.D.   On: 07/06/2018 02:23   Ir Percutaneous Art Thrombectomy/infusion Intracranial Inc Diag Angio  Result Date: 07/07/2018 INDICATION: Global aphasia. Right-sided hemiplegia. Occluded inferior division of the left middle cerebral artery M2 M3 region EXAM: 1. EMERGENT LARGE VESSEL OCCLUSION THROMBOLYSIS (POSTERIOR CIRCULATION) MEDICATIONS: No antibiotic was administered within 1 hour of the procedure. ANESTHESIA/SEDATION: General anesthesia CONTRAST:  Isovue 300 approximately 110 mL. FLUOROSCOPY TIME:  Fluoroscopy Time: 73 minutes 24 seconds (3818 mGy). COMPLICATIONS: None immediate. TECHNIQUE: Following a full explanation of the procedure along with the potential associated complications, an informed witnessed consent was obtained from the patient's husband. The risks of intracranial hemorrhage of 10%, worsening neurological deficit, ventilator dependency, death and inability to revascularize were all reviewed in detail with the patient's husband. The patient was then put under general anesthesia by the Department of Anesthesiology at [facility]. The right groin was prepped and draped in the usual sterile fashion. Thereafter using modified  Seldinger technique, transfemoral access into the right common femoral artery was obtained without difficulty. Over a 0.035 inch guidewire a 5 French Pinnacle sheath was inserted. Through this, and also over a 0.035 inch guidewire a 5 [place] JB 1 catheter was advanced to the aortic arch region and selectively positioned in the left common carotid artery. FINDINGS: The left common carotid arteriogram demonstrates the left external carotid artery and its major branches to be widely patent. The left internal carotid artery at the bulb demonstrates approximately 60% stenosis by the NASCET criteria secondary to a smooth posterior wall atherosclerotic plaque. Distal to  this the left internal carotid artery is seen to opacify to the cranial skull base. The petrous, cavernous and supraclinoid segments are widely patent. A left posterior communicating artery is seen opacifying the left posterior cerebral artery distribution. 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 The inferior division from the superior duplicated branch demonstrates complete angiographic occlusion. The left anterior cerebral artery opacifies into the capillary and venous phases. The lateral projection of the whole head run demonstrates a large area of hypoperfusion involving the left parietal and posterior frontal regions. PROCEDURE: ENDOVASCULAR REVASCULARIZATION OF OCCLUDED INFERIOR DIVISION OF THE UPPER BRANCH OF THE LEFT MIDDLE CEREBRAL ARTERY, WITH 2 PASSES WITH THE TREVO PROVUE 3 MM X 20 MM RETRIEVAL DEVICE, AND 1 PASS WITH THE 5 MM X 33 MM EMBOTRAP RETRIEVAL DEVICE ACHIEVING A TICI 3 REVASCULARIZATION. The diagnostic JB 1 catheter in the left common carotid artery was exchanged over a 0.035 inch 300 cm Rosen exchange guidewire for an 8 French 55 cm Brite tip neurovascular sheath using biplane roadmap technique and constant fluoroscopic guidance. Good aspiration obtained from the hub of the  neurovascular sheath. This was then connected to continuous heparinized saline infusion. Over the [employer] guidewire, an 8 [place] 85 cm FlowGate balloon guide catheter which had been prepped with 50% contrast and 50% heparinized saline infusion was positioned just proximal to the left common carotid bifurcation. The guidewire was removed. Good aspiration obtained from the hub of the [facility] guide catheter. Gentle contrast injection demonstrated no evidence of spasms, dissections or of intraluminal filling defects. A combination of a Catalyst 5 French 132 cm guide catheter inside of which was an 021 Trevo ProVue microcatheter was advanced over a 0.014 inch Softip Synchro micro guidewire to the distal end of the [facility] guide catheter. With the micro guidewire leading with a J-tip configuration to avoid dissections or inducing spasm, the combination was navigated without difficulty to the supraclinoid left ICA. The superior division was then selected with the micro guidewire followed by the microcatheter. Access through the occluded inferior division was then obtained with the micro guidewire to the M2 M3 regions followed by the microcatheter. The guidewire was removed. Good aspiration obtained from the hub of the microcatheter. A gentle contrast injection demonstrated slow distal antegrade flow. The microcatheter was then connected to continuous heparinized saline infusion. A 3 mm x 20 mm Trevo ProVue retrieval device was advanced to the distal end of microcatheter. The proximal and the distal landing zones were then defined. The O ring on the delivery microcatheter was loosened. With slight forward gentle traction with the right hand on the delivery micro guidewire, with the left hand the delivery microcatheter was retrieved unsheathing the retrieval device. A control arteriogram performed through the Catalyst guide catheter in the supraclinoid left ICA demonstrated partial revascularization of the inferior  division of the left middle cerebral artery. With proximal flow arrest in the left common carotid artery at the site of bifurcation, the combination of the retrieval device, the microcatheter, and the Catalyst guide catheter was retrieved as constant aspiration was applied with a 60 mL syringe at the hub of the [facility] guide catheter, and a Penumbra vacuum suction device at the hub of the Catalyst guide catheter. The combinations were retrieved and removed. No evidence of clot was seen in the retrieval device, or in the aspirate. Aspiration was continued as the proximal flow arrest was reversed. A control arteriogram performed through the [facility] guide catheter in the left common  carotid artery demonstrated no change in the occluded inferior division of the superior branch of the duplicated left MCA. A second attempt was then made again using the above combination. Again access into the distal M2 M3 region of the occluded vessel was obtained with the micro guidewire followed by the microcatheter. After having verified safe position of tip of the microcatheter, the 3 mm x 20 mm Trevo ProVue device was again deployed as above. With proximal flow arrest in the left common carotid artery by inflating the balloon of the [facility] guide catheter, the combination of the retrieval device, microcatheter and the Catalyst guide catheter were retrieved and removed. Again aspiration was applied with a 60 mL syringe at the hub of the [facility] guide catheter, and the Penumbra suction device at the hub of the Catalyst guide catheter. Aspiration was continued as flow arrest was reversed. A control arteriogram performed again through the [facility] guide catheter in the left common carotid artery continued to demonstrate no real opacification of the inferior division of the superior branch of the duplicated left middle cerebral artery. A controlled arteriogram performed was then made using the combination of the Trevo ProVue  microcatheter inside of a 5 French 132 cm Catalyst guide catheter which was advanced to the left middle cerebral artery over a 0.014 inch Softip Synchro micro guidewire. Again access into the occluded inferior division was obtained with the micro guidewire followed by the microcatheter which was positioned in the M3 region of the left middle cerebral artery. The guidewire was removed. Again after having verified safe position of the tip of the microcatheter, a 5 mm x 33 mm Embotrap retrieval device was advanced to the distal end of the microcatheter. The proximal and the distal landing zones were defined. With proximal flow arrest in the left internal carotid artery, the combination of the retrieval device, the microcatheter, and the 5 French 132 cm Catalyst guide catheter were retrieved and removed as constant aspiration was applied with a 60 mL syringe at the hub of the [facility] guide catheter, and the Penumbra suction device at the hub of the Catalyst guide catheter. The combination was retrieved and removed. A clot was noted entangled and the cells of the retrieval device. The aspirate was continued as flow arrest was reversed by deflating the balloon in the left common carotid artery. An arteriogram performed with free back bleed of blood at the hub of the [facility] guide catheter demonstrated now complete revascularization of the previously occluded inferior division of the superior branch of the left middle cerebral artery. A control arteriogram performed centered over the whole head now demonstrated reperfusion of the previously noted hypoperfused parietal and the posterior frontal regions. A TICI 3 revascularization had been achieved of the left middle cerebral distribution. A control arteriogram performed through the [facility] guide catheter in the left common carotid artery centered over the carotid bifurcation continued to demonstrate 60% stenosis of the proximal left internal carotid artery at the bulb.  No evidence of dissections or of intraluminal filling defects were noted extra cranially. The 8 [place] FlowGate guide catheter, and the 8 [place] neurovascular sheath were then removed over a J-tip guidewire for an 8 [place] Pinnacle sheath. This was then removed with the successful application of a 7 [place] Exo-Seal closure device achieving hemostasis. Distal pulses in both feet were Dopplerable in the dorsalis pedis, and the posterior tibial regions bilaterally unchanged from prior to the beginning of the procedure. The right groin appeared soft without evidence of hematoma  or bleeding. A Dyna CT of the brain performed demonstrated a focal area of hyperdensity in the subcortical insular area lateral to the putamen and a small one just cranial and lateral to this. No known gross hemorrhages, or mass effect or midline shift was noted. The patient was left intubated on account of her difficulty with comprehension prior to the intubation. The patient was then transferred to the neuro ICU to continue with post thrombectomy management. IMPRESSION: Status post endovascular complete revascularization of occluded inferior division of the superior duplicated left middle cerebral artery with 2 passes with the 3 mm x 20 mm Trevo ProVue retrieval device, and 1 pass with the Embotrap 5 mm x 33 mm retrieval device achieving a revascularization. PLAN: Follow-up in the clinic in 4 to 6 weeks post discharge. Electronically Signed   By: Luanne Bras M.D.   On: 07/06/2018 14:56   Carotid (at [place] Only)  Result Date: 07/07/2018 Carotid Arterial Duplex Study Indications: CVA. Performing Technologist: Oliver Hum RVT  Examination Guidelines: A complete evaluation includes B-mode imaging, spectral Doppler, color Doppler, and power Doppler as needed of all accessible portions of each vessel. Bilateral testing is considered an integral part of a complete examination. Limited examinations for reoccurring indications may be  performed as noted.  Right Carotid Findings: +----------+--------+-------+--------+--------------------------------+--------+           PSV cm/sEDV    StenosisDescribe                        Comments                   cm/s                                                    +----------+--------+-------+--------+--------------------------------+--------+ CCA Prox  65      17             smooth and heterogenous                  +----------+--------+-------+--------+--------------------------------+--------+ CCA Distal53      13             smooth, heterogenous and                                                  calcific                                 +----------+--------+-------+--------+--------------------------------+--------+ ICA Prox  56      16             smooth, heterogenous and                                                  calcific                                 +----------+--------+-------+--------+--------------------------------+--------+ ICA Distal73  22                                             tortuous +----------+--------+-------+--------+--------------------------------+--------+ ECA       331     37             calcific                                 +----------+--------+-------+--------+--------------------------------+--------+ +----------+--------+-------+--------+-------------------+           PSV cm/sEDV cmsDescribeArm Pressure (mmHG) +----------+--------+-------+--------+-------------------+ [id number]                                        +----------+--------+-------+--------+-------------------+ +---------+--------+--+--------+--+---------+ VertebralPSV cm/s60EDV cm/s20Antegrade +---------+--------+--+--------+--+---------+  Left Carotid Findings: +----------+--------+-------+--------+--------------------------------+--------+           PSV cm/sEDV    StenosisDescribe                         Comments                   cm/s                                                    +----------+--------+-------+--------+--------------------------------+--------+ CCA Prox  70      20             smooth and heterogenous                  +----------+--------+-------+--------+--------------------------------+--------+ CCA Distal68      14             smooth, heterogenous and                                                  calcific                                 +----------+--------+-------+--------+--------------------------------+--------+ ICA Prox  166     44             smooth, heterogenous and                                                  calcific                                 +----------+--------+-------+--------+--------------------------------+--------+ ICA Mid   102     23                                             tortuous +----------+--------+-------+--------+--------------------------------+--------+ ICA Distal56  18                                             tortuous +----------+--------+-------+--------+--------------------------------+--------+ ECA       57      11             calcific                                 +----------+--------+-------+--------+--------------------------------+--------+ +----------+--------+--------+--------+-------------------+ SubclavianPSV cm/sEDV cm/sDescribeArm Pressure (mmHG) +----------+--------+--------+--------+-------------------+           102                                         +----------+--------+--------+--------+-------------------+ +---------+--------+--+--------+--+---------+ VertebralPSV cm/s54EDV cm/s15Antegrade +---------+--------+--+--------+--+---------+  Summary: Right Carotid: Velocities in the right ICA are consistent with a 1-39% stenosis. Left Carotid: Velocities in the left ICA are consistent with a 40-59% stenosis. Vertebrals: Bilateral vertebral  arteries demonstrate antegrade flow. *See table(s) above for measurements and observations.  Electronically signed by Antony Contras MD on 07/07/2018 at 8:02:21 AM.    Final      PHYSICAL EXAM  Temp:  [98.5 F (36.9 C)-100.6 F (38.1 C)] 100.6 F (38.1 C) (11/19 0800) Pulse Rate:  [54-100] 91 (11/19 0940) Resp:  [16-23] 22 (11/19 0940) BP: (99-162)/(45-127) 148/60 (11/19 0900) SpO2:  [99 %-100 %] 100 % (11/19 0940) Arterial Line BP: (100-190)/(49-84) 135/54 (11/19 0500) FiO2 (%):  [40 %] 40 % (11/19 0747)  General - Well nourished, well developed, mildly lethargic after extubation  Ophthalmologic - fundi not visualized due to noncooperation.  Cardiovascular - irregularly irregular heart rate and rhythm.  Neuro - extubated, mildly lethargic, but eyes open and following commands. However, she still has expressive aphasia and not able to have words out. But able to follow most of the simple commands. Eyes move in both directions, no forced gaze. Right facial droop, tongue midline, blinking to visual threat bilaterally. LUE and LLE 4/5, RUE 3-/5 and RLE 2/5. DTR 1+ and positive babinski on the right. Sensation and coordination not cooperative. Gait not tested.    ASSESSMENT/PLAN Ms. DENISS WORMLEY is a 77 y.o. female with history of afib not on AC, CAD, HTN, HLD, DM and obsity admitted for aphasia and right sided weakness. TPA given and s/p thrombectomy.    Stroke:  left MCA infarct with left M2 occlusion s/p tPA and IR with TICI3 reperfusion, embolic secondary to Afib not on AC  Resultant right hemiparesis, expressive aphasia  CTA head and neck - duplicated left MCAs with left M2/M3 occlusion  DSA left M2/M3 occlusion s/p TICI3 reperfusion  MRI left MCA small to moderate infarcts   MRA unremarkable, duplicated MCA  CUS left ICA 40-59% stenosis  2D Echo  pending  LDL 72  HgbA1c 5.6  SCDs for VTE prophylaxis  aspirin 325 mg daily prior to admission, now on ASA 81mg . Will  start Reserve for stroke prevention in 2 days given the size of stroke.   Ongoing aggressive stroke risk factor management  Therapy recommendations:  pending  Disposition:  Pending  Respiratory failure  Extubated this am  CCM on board  Still has wheezing and rales no auscutation   Will give IV lasix 40 and  decrease IVF  Chronic afib on ASA   Followed with Dr. Geraldo Pitter  Not on [facility] due to back surgery with unsteady gait  On ASA 325 PTA  Rate controlled  On digoxin and metoprolol PTA  Now on ASA 81mg , will start DOACs in 2 days.   Diabetes  HgbA1c 5.6 goal < 7.0  Controlled  CBG monitoring  SSI  Hypertension . Stable . BP goal < 160  Long term BP goal normotensive  Hyperlipidemia  Home meds:  none   LDL 72, goal < 70  Consider statin once po access  Dysphagia   Pending swallow evaluation  NPO for now  Speech on board  Other Stroke Risk Factors  Advanced age  Obesity, Body mass index is 36.58 kg/m.   Coronary artery disease  Other Active Problems    Hospital day # 2  This patient is critically ill due to left MCA infarct, s/p tPA and IR, respiratory failure, afib not on AC and at significant risk of neurological worsening, death form recurrent stroke, hemorrhagic conversion, seizure, heart failure, cardiac arrest. This patient's care requires constant monitoring of vital signs, hemodynamics, respiratory and cardiac monitoring, review of multiple databases, neurological assessment, discussion with family, other specialists and medical decision making of high complexity. I spent 45 minutes of neurocritical care time in the care of this patient. I had long discussion with daughters at bedside, updated pt current condition, treatment plan and potential prognosis. They expressed understanding and appreciation.    Rosalin Hawking, MD PhD Stroke Neurology 07/07/2018 11:53 AM    To contact Stroke Continuity provider, please refer to  [email or web address] After hours, contact General Neurology

## 2018-07-07 NOTE — Progress Notes (Signed)
Referring Physician(s): CODE STROKE- Greta Doom  Supervising Physician: Luanne Bras  Patient Status:  [facility] - In-pt  Chief Complaint: None  Subjective:  Left MCA superior division distal M2/M3 junction occlusion s/p emergent mechanical thrombectomy achieving a TICI 3 revascularization 07/06/2018 by Dr. Estanislado Pandy. Patient laying in bed intubated without sedation. She is more alert than yesterday. Accompanied by 2 female family members at bedside. Can wiggle bilateral toes, now with spontaneous movement of left arm, no spontaneous movement of right arm. Still with right gaze preference. Right groin incision c/d/i.   Allergies: Sulfa antibiotics  Medications: Prior to Admission medications   Medication Sig Start Date End Date Taking? Authorizing Provider  albuterol (PROVENTIL HFA;VENTOLIN HFA) 108 (90 Base) MCG/ACT inhaler Inhale 2 puffs into the lungs every 6 (six) hours as needed for wheezing or shortness of breath.    Yes [provider]  Ascorbic Acid (VITAMIN C) 1000 MG tablet Take 1,000 mg by mouth 2 (two) times daily.   Yes [provider]  aspirin EC 325 MG tablet Take 1 tablet (325 mg total) by mouth daily. 04/03/17  Yes Revankar, Reita Cliche, MD  budesonide (PULMICORT) 0.5 MG/2ML nebulizer solution Take 0.5 mg by nebulization daily.    Yes [provider]  captopril (CAPOTEN) 50 MG tablet TAKE 1 TABLET BY MOUTH THREE TIMES DAILY Patient taking differently: Take 50 mg by mouth 3 (three) times daily.  06/23/18  Yes Revankar, Reita Cliche, MD  digoxin (LANOXIN) 0.125 MG tablet TAKE 1 TABLET BY MOUTH ONCE DAILY Patient taking differently: Take 0.125 mg by mouth daily.  05/04/18  Yes Revankar, Reita Cliche, MD  febuxostat (ULORIC) 40 MG tablet Take 40 mg by mouth daily. 06/30/18  Yes [provider]  furosemide (LASIX) 40 MG tablet Take 40 mg by mouth daily.   Yes [provider]  gabapentin (NEURONTIN) 300 MG capsule Take 300 mg  by mouth 3 (three) times daily. 05/11/18  Yes [provider]  Glucosamine Sulfate 500 MG TABS Take 500 mg by mouth daily.   Yes [provider]  ipratropium-albuterol (DUONEB) 0.5-2.5 (3) MG/3ML SOLN Take 3 mLs by nebulization every 6 (six) hours as needed (shortness of breath/wheezing.).  05/13/17  Yes [provider]  metoprolol succinate (TOPROL-XL) 50 MG 24 hr tablet Take 1 tablet (50 mg total) by mouth daily. 05/04/18  Yes Revankar, Reita Cliche, MD  montelukast (SINGULAIR) 10 MG tablet Take 10 mg by mouth daily.   Yes [provider]  nitroGLYCERIN (NITROSTAT) 0.4 MG SL tablet Place 0.4 mg under the tongue every 5 (five) minutes as needed for chest pain.  11/16/15  Yes [provider]  pantoprazole (PROTONIX) 40 MG tablet Take 40 mg by mouth daily.   Yes [provider]  traMADol (ULTRAM) 50 MG tablet Take 50 mg by mouth daily as needed for moderate pain.    Yes [provider]  vitamin E 400 UNIT capsule Take 400 Units by mouth daily.   Yes [provider]  captopril (CAPOTEN) 50 MG tablet Take 1 tablet (50 mg total) by mouth 3 (three) times daily. Patient not taking: Reported on 07/06/2018 06/02/17   Revankar, Reita Cliche, MD     Vital Signs: BP (!) 148/60   Pulse 91   Temp (!) 100.6 F (38.1 C) (Axillary)   Resp (!) 22   Ht 5\' 2"  (1.575 m)   Wt 200 lb (90.7 kg)   SpO2 100%   BMI 36.58 kg/m  Physical Exam  Constitutional: She appears well-developed and well-nourished. No distress.  Intubated without sedation.  Pulmonary/Chest: Effort normal. No respiratory distress.  Intubated without sedation.  Neurological:  Intubated without sedation. She is more alert than yesterday. Speech and comprehension not assessed. PERRL bilaterally. Demonstrates right gaze preference. Visual fields not assessed. Facial asymmetry not assessed. Tongue protrusion not assessed. Can wiggle bilateral toes and now with spontaneous  movement of left arm but no spontaneous movement of right arm. Pronator drift not assessed. Fine motor and coordination not assessed. Gait not assessed. Romberg not assessed. Heel to toe not assessed. Distal pulses palpable bilaterally with Doppler.  Skin: Skin is warm and dry.  Right groin incision soft without active bleeding or hematoma.  Psychiatric:  Intubated without sedation.  Nursing note and vitals reviewed.   Imaging: [name] YT Contrast  Result Date: 07/06/2018 CLINICAL DATA:  Follow-up examination for acute stroke. 77 year old female who presented with aphasia and right-sided weakness, status post tPA and catheter directed thrombectomy. EXAM: MRI HEAD WITHOUT CONTRAST MRA HEAD WITHOUT CONTRAST TECHNIQUE: Multiplanar, multiecho pulse sequences of the brain and surrounding structures were obtained without intravenous contrast. Angiographic images of the head were obtained using MRA technique without contrast. COMPARISON:  Prior CT and arteriogram from 07/05/2018. FINDINGS: MRI HEAD FINDINGS Brain: Generalized age-related cerebral atrophy. Patchy and confluent T2/FLAIR hyperintensity within the periventricular and deep white matter both cerebral hemispheres, most consistent with chronic small vessel ischemic disease, moderate in nature. Patchy small moderate volume restricted diffusion seen involving the left insula and overlying left frontal operculum, extending to involve the left periventricular white matter, consistent with acute left MCA territory infarct (series 3, image 26). Associated mild localized gyral swelling and edema without significant mass effect. Patchy susceptibility artifact within the area of infarction consistent with associated petechial hemorrhage (series 6, image 54). No frank hemorrhagic transformation by MRI. No other evidence for acute or subacute ischemia. Gray-white matter differentiation otherwise maintained. Few small chronic micro hemorrhages noted  involving the temporal lobes bilaterally. Probable 12 mm densely calcified meningioma overlies the anterior left frontal convexity. No associated edema or mass effect. No other mass lesion. No midline shift or mass effect. No hydrocephalus. Pituitary gland within normal limits. Vascular: Major intracranial vascular flow voids maintained. Skull and upper cervical spine: Craniocervical junction within normal limits. Bone marrow signal intensity normal. Mild soft tissue swelling noted at the left frontoparietal scalp near the vertex (series 11, image 12). Sinuses/Orbits: Globes and orbital soft tissues within normal limits. Paranasal sinuses are clear. Small left mastoid effusion, of doubtful significance. Inner ear structures grossly normal. Other: None. MRA HEAD FINDINGS ANTERIOR CIRCULATION: Distal cervical segments of the internal carotid arteries are widely patent with antegrade flow. Petrous, cavernous, and supraclinoid segments widely patent without stenosis. Origin of the ophthalmic arteries patent bilaterally. ICA termini well perfused and symmetric. A1 segments patent bilaterally. Normal anterior communicating artery. Anterior cerebral arteries widely patent to their distal aspects without stenosis. Right M1 widely patent without stenosis. Normal right MCA bifurcation. No proximal right M2 occlusion. Distal right MCA branches well perfused. Left M1 duplicated and/or bifurcates early at the origin. No M1 stenosis or occlusion. No proximal left M2 occlusion. Distal left MCA branches well perfused. POSTERIOR CIRCULATION: Vertebral arteries patent to the vertebrobasilar junction without stenosis. Right vertebral artery dominant. Posterior inferior cerebral arteries not visualized. Basilar widely patent to its distal aspect without stenosis. Superior cerebral arteries patent bilaterally. Posterior cerebral arteries well perfused to their distal aspects without stenosis.  Prominent left posterior communicating  artery noted. IMPRESSION: MRI HEAD IMPRESSION: 1. Small to moderate evolving acute left MCA territory infarct as above. Associated petechial hemorrhage without frank hemorrhagic transformation or significant mass effect. 2. Underlying moderate chronic microvascular ischemic disease. MRA HEAD IMPRESSION: 1. Negative intracranial MRA. No evidence for large vessel occlusion status post catheter directed thrombectomy. No hemodynamically significant or correctable stenosis identified. 2. Duplicated left M1 segment. Electronically Signed   By: Jeannine Boga M.D.   On: 07/06/2018 19:43   Mr Brain Wo Contrast  Result Date: 07/06/2018 CLINICAL DATA:  Follow-up examination for acute stroke. 77 year old female who presented with aphasia and right-sided weakness, status post tPA and catheter directed thrombectomy. EXAM: MRI HEAD WITHOUT CONTRAST MRA HEAD WITHOUT CONTRAST TECHNIQUE: Multiplanar, multiecho pulse sequences of the brain and surrounding structures were obtained without intravenous contrast. Angiographic images of the head were obtained using MRA technique without contrast. COMPARISON:  Prior CT and arteriogram from 07/05/2018. FINDINGS: MRI HEAD FINDINGS Brain: Generalized age-related cerebral atrophy. Patchy and confluent T2/FLAIR hyperintensity within the periventricular and deep white matter both cerebral hemispheres, most consistent with chronic small vessel ischemic disease, moderate in nature. Patchy small moderate volume restricted diffusion seen involving the left insula and overlying left frontal operculum, extending to involve the left periventricular white matter, consistent with acute left MCA territory infarct (series 3, image 26). Associated mild localized gyral swelling and edema without significant mass effect. Patchy susceptibility artifact within the area of infarction consistent with associated petechial hemorrhage (series 6, image 54). No frank hemorrhagic transformation by MRI. No  other evidence for acute or subacute ischemia. Gray-white matter differentiation otherwise maintained. Few small chronic micro hemorrhages noted involving the temporal lobes bilaterally. Probable 12 mm densely calcified meningioma overlies the anterior left frontal convexity. No associated edema or mass effect. No other mass lesion. No midline shift or mass effect. No hydrocephalus. Pituitary gland within normal limits. Vascular: Major intracranial vascular flow voids maintained. Skull and upper cervical spine: Craniocervical junction within normal limits. Bone marrow signal intensity normal. Mild soft tissue swelling noted at the left frontoparietal scalp near the vertex (series 11, image 12). Sinuses/Orbits: Globes and orbital soft tissues within normal limits. Paranasal sinuses are clear. Small left mastoid effusion, of doubtful significance. Inner ear structures grossly normal. Other: None. MRA HEAD FINDINGS ANTERIOR CIRCULATION: Distal cervical segments of the internal carotid arteries are widely patent with antegrade flow. Petrous, cavernous, and supraclinoid segments widely patent without stenosis. Origin of the ophthalmic arteries patent bilaterally. ICA termini well perfused and symmetric. A1 segments patent bilaterally. Normal anterior communicating artery. Anterior cerebral arteries widely patent to their distal aspects without stenosis. Right M1 widely patent without stenosis. Normal right MCA bifurcation. No proximal right M2 occlusion. Distal right MCA branches well perfused. Left M1 duplicated and/or bifurcates early at the origin. No M1 stenosis or occlusion. No proximal left M2 occlusion. Distal left MCA branches well perfused. POSTERIOR CIRCULATION: Vertebral arteries patent to the vertebrobasilar junction without stenosis. Right vertebral artery dominant. Posterior inferior cerebral arteries not visualized. Basilar widely patent to its distal aspect without stenosis. Superior cerebral arteries  patent bilaterally. Posterior cerebral arteries well perfused to their distal aspects without stenosis. Prominent left posterior communicating artery noted. IMPRESSION: MRI HEAD IMPRESSION: 1. Small to moderate evolving acute left MCA territory infarct as above. Associated petechial hemorrhage without frank hemorrhagic transformation or significant mass effect. 2. Underlying moderate chronic microvascular ischemic disease. MRA HEAD IMPRESSION: 1. Negative intracranial MRA. No evidence for large vessel occlusion  status post catheter directed thrombectomy. No hemodynamically significant or correctable stenosis identified. 2. Duplicated left M1 segment. Electronically Signed   By: Jeannine Boga M.D.   On: 07/06/2018 19:43   [place]  Result Date: 07/07/2018 INDICATION: Global aphasia. Right-sided hemiplegia. Occluded inferior division of the left middle cerebral artery M2 M3 region EXAM: 1. EMERGENT LARGE VESSEL OCCLUSION THROMBOLYSIS (POSTERIOR CIRCULATION) MEDICATIONS: No antibiotic was administered within 1 hour of the procedure. ANESTHESIA/SEDATION: General anesthesia CONTRAST:  Isovue 300 approximately 110 mL. FLUOROSCOPY TIME:  Fluoroscopy Time: 73 minutes 24 seconds (3818 mGy). COMPLICATIONS: None immediate. TECHNIQUE: Following a full explanation of the procedure along with the potential associated complications, an informed witnessed consent was obtained from the patient's husband. The risks of intracranial hemorrhage of 10%, worsening neurological deficit, ventilator dependency, death and inability to revascularize were all reviewed in detail with the patient's husband. The patient was then put under general anesthesia by the Department of Anesthesiology at [facility]. The right groin was prepped and draped in the usual sterile fashion. Thereafter using modified Seldinger technique, transfemoral access into the right common femoral artery was obtained without difficulty. Over a  0.035 inch guidewire a 5 French Pinnacle sheath was inserted. Through this, and also over a 0.035 inch guidewire a 5 [place] JB 1 catheter was advanced to the aortic arch region and selectively positioned in the left common carotid artery. FINDINGS: The left common carotid arteriogram demonstrates the left external carotid artery and its major branches to be widely patent. The left internal carotid artery at the bulb demonstrates approximately 60% stenosis by the NASCET criteria secondary to a smooth posterior wall atherosclerotic plaque. Distal to this the left internal carotid artery is seen to opacify to the cranial skull base. The petrous, cavernous and supraclinoid segments are widely patent. A left posterior communicating artery is seen opacifying the left posterior cerebral artery distribution. 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 The inferior division from the superior duplicated branch demonstrates complete angiographic occlusion. The left anterior cerebral artery opacifies into the capillary and venous phases. The lateral projection of the whole head run demonstrates a large area of hypoperfusion involving the left parietal and posterior frontal regions. PROCEDURE: ENDOVASCULAR REVASCULARIZATION OF OCCLUDED INFERIOR DIVISION OF THE UPPER BRANCH OF THE LEFT MIDDLE CEREBRAL ARTERY, WITH 2 PASSES WITH THE TREVO PROVUE 3 MM X 20 MM RETRIEVAL DEVICE, AND 1 PASS WITH THE 5 MM X 33 MM EMBOTRAP RETRIEVAL DEVICE ACHIEVING A TICI 3 REVASCULARIZATION. The diagnostic JB 1 catheter in the left common carotid artery was exchanged over a 0.035 inch 300 cm Rosen exchange guidewire for an 8 French 55 cm Brite tip neurovascular sheath using biplane roadmap technique and constant fluoroscopic guidance. Good aspiration obtained from the hub of the neurovascular sheath. This was then connected to continuous heparinized saline infusion. Over the [employer] guidewire, an 8  [place] 85 cm FlowGate balloon guide catheter which had been prepped with 50% contrast and 50% heparinized saline infusion was positioned just proximal to the left common carotid bifurcation. The guidewire was removed. Good aspiration obtained from the hub of the [facility] guide catheter. Gentle contrast injection demonstrated no evidence of spasms, dissections or of intraluminal filling defects. A combination of a Catalyst 5 French 132 cm guide catheter inside of which was an 021 Trevo ProVue microcatheter was advanced over a 0.014 inch Softip Synchro micro guidewire to the distal end of the [facility] guide catheter. With the micro  guidewire leading with a J-tip configuration to avoid dissections or inducing spasm, the combination was navigated without difficulty to the supraclinoid left ICA. The superior division was then selected with the micro guidewire followed by the microcatheter. Access through the occluded inferior division was then obtained with the micro guidewire to the M2 M3 regions followed by the microcatheter. The guidewire was removed. Good aspiration obtained from the hub of the microcatheter. A gentle contrast injection demonstrated slow distal antegrade flow. The microcatheter was then connected to continuous heparinized saline infusion. A 3 mm x 20 mm Trevo ProVue retrieval device was advanced to the distal end of microcatheter. The proximal and the distal landing zones were then defined. The O ring on the delivery microcatheter was loosened. With slight forward gentle traction with the right hand on the delivery micro guidewire, with the left hand the delivery microcatheter was retrieved unsheathing the retrieval device. A control arteriogram performed through the Catalyst guide catheter in the supraclinoid left ICA demonstrated partial revascularization of the inferior division of the left middle cerebral artery. With proximal flow arrest in the left common carotid artery at the site of  bifurcation, the combination of the retrieval device, the microcatheter, and the Catalyst guide catheter was retrieved as constant aspiration was applied with a 60 mL syringe at the hub of the [facility] guide catheter, and a Penumbra vacuum suction device at the hub of the Catalyst guide catheter. The combinations were retrieved and removed. No evidence of clot was seen in the retrieval device, or in the aspirate. Aspiration was continued as the proximal flow arrest was reversed. A control arteriogram performed through the [facility] guide catheter in the left common carotid artery demonstrated no change in the occluded inferior division of the superior branch of the duplicated left MCA. A second attempt was then made again using the above combination. Again access into the distal M2 M3 region of the occluded vessel was obtained with the micro guidewire followed by the microcatheter. After having verified safe position of tip of the microcatheter, the 3 mm x 20 mm Trevo ProVue device was again deployed as above. With proximal flow arrest in the left common carotid artery by inflating the balloon of the [facility] guide catheter, the combination of the retrieval device, microcatheter and the Catalyst guide catheter were retrieved and removed. Again aspiration was applied with a 60 mL syringe at the hub of the [facility] guide catheter, and the Penumbra suction device at the hub of the Catalyst guide catheter. Aspiration was continued as flow arrest was reversed. A control arteriogram performed again through the [facility] guide catheter in the left common carotid artery continued to demonstrate no real opacification of the inferior division of the superior branch of the duplicated left middle cerebral artery. A controlled arteriogram performed was then made using the combination of the Trevo ProVue microcatheter inside of a 5 French 132 cm Catalyst guide catheter which was advanced to the left middle cerebral artery over a  0.014 inch Softip Synchro micro guidewire. Again access into the occluded inferior division was obtained with the micro guidewire followed by the microcatheter which was positioned in the M3 region of the left middle cerebral artery. The guidewire was removed. Again after having verified safe position of the tip of the microcatheter, a 5 mm x 33 mm Embotrap retrieval device was advanced to the distal end of the microcatheter. The proximal and the distal landing zones were defined. With proximal flow arrest in the left internal carotid artery,  the combination of the retrieval device, the microcatheter, and the 5 French 132 cm Catalyst guide catheter were retrieved and removed as constant aspiration was applied with a 60 mL syringe at the hub of the [facility] guide catheter, and the Penumbra suction device at the hub of the Catalyst guide catheter. The combination was retrieved and removed. A clot was noted entangled and the cells of the retrieval device. The aspirate was continued as flow arrest was reversed by deflating the balloon in the left common carotid artery. An arteriogram performed with free back bleed of blood at the hub of the [facility] guide catheter demonstrated now complete revascularization of the previously occluded inferior division of the superior branch of the left middle cerebral artery. A control arteriogram performed centered over the whole head now demonstrated reperfusion of the previously noted hypoperfused parietal and the posterior frontal regions. A TICI 3 revascularization had been achieved of the left middle cerebral distribution. A control arteriogram performed through the [facility] guide catheter in the left common carotid artery centered over the carotid bifurcation continued to demonstrate 60% stenosis of the proximal left internal carotid artery at the bulb. No evidence of dissections or of intraluminal filling defects were noted extra cranially. The 8 [place] FlowGate guide catheter,  and the 8 [place] neurovascular sheath were then removed over a J-tip guidewire for an 8 [place] Pinnacle sheath. This was then removed with the successful application of a 7 [place] Exo-Seal closure device achieving hemostasis. Distal pulses in both feet were Dopplerable in the dorsalis pedis, and the posterior tibial regions bilaterally unchanged from prior to the beginning of the procedure. The right groin appeared soft without evidence of hematoma or bleeding. A Dyna CT of the brain performed demonstrated a focal area of hyperdensity in the subcortical insular area lateral to the putamen and a small one just cranial and lateral to this. No known gross hemorrhages, or mass effect or midline shift was noted. The patient was left intubated on account of her difficulty with comprehension prior to the intubation. The patient was then transferred to the neuro ICU to continue with post thrombectomy management. IMPRESSION: Status post endovascular complete revascularization of occluded inferior division of the superior duplicated left middle cerebral artery with 2 passes with the 3 mm x 20 mm Trevo ProVue retrieval device, and 1 pass with the Embotrap 5 mm x 33 mm retrieval device achieving a revascularization. PLAN: Follow-up in the clinic in 4 to 6 weeks post discharge. Electronically Signed   By: Luanne Bras M.D.   On: 07/06/2018 14:56   Portable Chest X-ray  Result Date: 07/06/2018 CLINICAL DATA:  Endotracheal intubation EXAM: PORTABLE CHEST 1 VIEW COMPARISON:  None. FINDINGS: Endotracheal tube tip is at the level of the clavicular heads. There is moderate cardiomegaly without pulmonary edema. No pneumothorax. No focal airspace consolidation. IMPRESSION: 1. Endotracheal tube tip at the level of the clavicular heads. 2. Cardiomegaly. Electronically Signed   By: Ulyses Jarred M.D.   On: 07/06/2018 02:24   Dg Abd Portable 1v  Result Date: 07/06/2018 CLINICAL DATA:  Orogastric tube placement EXAM: PORTABLE  ABDOMEN - 1 VIEW COMPARISON:  None. FINDINGS: OG tube tip and side port project over the midline upper abdomen. The tip is in the expected location of the gastroduodenal junction. IMPRESSION: OG tube tip in the expected location of the gastroduodenal junction. Electronically Signed   By: Ulyses Jarred M.D.   On: 07/06/2018 02:23   Ir Percutaneous Art Thrombectomy/infusion Intracranial Inc Diag Angio  Result Date: 07/07/2018 INDICATION: Global aphasia. Right-sided hemiplegia. Occluded inferior division of the left middle cerebral artery M2 M3 region EXAM: 1. EMERGENT LARGE VESSEL OCCLUSION THROMBOLYSIS (POSTERIOR CIRCULATION) MEDICATIONS: No antibiotic was administered within 1 hour of the procedure. ANESTHESIA/SEDATION: General anesthesia CONTRAST:  Isovue 300 approximately 110 mL. FLUOROSCOPY TIME:  Fluoroscopy Time: 73 minutes 24 seconds (3818 mGy). COMPLICATIONS: None immediate. TECHNIQUE: Following a full explanation of the procedure along with the potential associated complications, an informed witnessed consent was obtained from the patient's husband. The risks of intracranial hemorrhage of 10%, worsening neurological deficit, ventilator dependency, death and inability to revascularize were all reviewed in detail with the patient's husband. The patient was then put under general anesthesia by the Department of Anesthesiology at [facility]. The right groin was prepped and draped in the usual sterile fashion. Thereafter using modified Seldinger technique, transfemoral access into the right common femoral artery was obtained without difficulty. Over a 0.035 inch guidewire a 5 French Pinnacle sheath was inserted. Through this, and also over a 0.035 inch guidewire a 5 [place] JB 1 catheter was advanced to the aortic arch region and selectively positioned in the left common carotid artery. FINDINGS: The left common carotid arteriogram demonstrates the left external carotid artery and its major branches  to be widely patent. The left internal carotid artery at the bulb demonstrates approximately 60% stenosis by the NASCET criteria secondary to a smooth posterior wall atherosclerotic plaque. Distal to this the left internal carotid artery is seen to opacify to the cranial skull base. The petrous, cavernous and supraclinoid segments are widely patent. A left posterior communicating artery is seen opacifying the left posterior cerebral artery distribution. 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 The inferior division from the superior duplicated branch demonstrates complete angiographic occlusion. The left anterior cerebral artery opacifies into the capillary and venous phases. The lateral projection of the whole head run demonstrates a large area of hypoperfusion involving the left parietal and posterior frontal regions. PROCEDURE: ENDOVASCULAR REVASCULARIZATION OF OCCLUDED INFERIOR DIVISION OF THE UPPER BRANCH OF THE LEFT MIDDLE CEREBRAL ARTERY, WITH 2 PASSES WITH THE TREVO PROVUE 3 MM X 20 MM RETRIEVAL DEVICE, AND 1 PASS WITH THE 5 MM X 33 MM EMBOTRAP RETRIEVAL DEVICE ACHIEVING A TICI 3 REVASCULARIZATION. The diagnostic JB 1 catheter in the left common carotid artery was exchanged over a 0.035 inch 300 cm Rosen exchange guidewire for an 8 French 55 cm Brite tip neurovascular sheath using biplane roadmap technique and constant fluoroscopic guidance. Good aspiration obtained from the hub of the neurovascular sheath. This was then connected to continuous heparinized saline infusion. Over the [employer] guidewire, an 8 [place] 85 cm FlowGate balloon guide catheter which had been prepped with 50% contrast and 50% heparinized saline infusion was positioned just proximal to the left common carotid bifurcation. The guidewire was removed. Good aspiration obtained from the hub of the [facility] guide catheter. Gentle contrast injection demonstrated no evidence of spasms,  dissections or of intraluminal filling defects. A combination of a Catalyst 5 French 132 cm guide catheter inside of which was an 021 Trevo ProVue microcatheter was advanced over a 0.014 inch Softip Synchro micro guidewire to the distal end of the [facility] guide catheter. With the micro guidewire leading with a J-tip configuration to avoid dissections or inducing spasm, the combination was navigated without difficulty to the supraclinoid left ICA. The superior division was then selected with the micro guidewire followed by the microcatheter. Access  through the occluded inferior division was then obtained with the micro guidewire to the M2 M3 regions followed by the microcatheter. The guidewire was removed. Good aspiration obtained from the hub of the microcatheter. A gentle contrast injection demonstrated slow distal antegrade flow. The microcatheter was then connected to continuous heparinized saline infusion. A 3 mm x 20 mm Trevo ProVue retrieval device was advanced to the distal end of microcatheter. The proximal and the distal landing zones were then defined. The O ring on the delivery microcatheter was loosened. With slight forward gentle traction with the right hand on the delivery micro guidewire, with the left hand the delivery microcatheter was retrieved unsheathing the retrieval device. A control arteriogram performed through the Catalyst guide catheter in the supraclinoid left ICA demonstrated partial revascularization of the inferior division of the left middle cerebral artery. With proximal flow arrest in the left common carotid artery at the site of bifurcation, the combination of the retrieval device, the microcatheter, and the Catalyst guide catheter was retrieved as constant aspiration was applied with a 60 mL syringe at the hub of the [facility] guide catheter, and a Penumbra vacuum suction device at the hub of the Catalyst guide catheter. The combinations were retrieved and removed. No evidence of  clot was seen in the retrieval device, or in the aspirate. Aspiration was continued as the proximal flow arrest was reversed. A control arteriogram performed through the [facility] guide catheter in the left common carotid artery demonstrated no change in the occluded inferior division of the superior branch of the duplicated left MCA. A second attempt was then made again using the above combination. Again access into the distal M2 M3 region of the occluded vessel was obtained with the micro guidewire followed by the microcatheter. After having verified safe position of tip of the microcatheter, the 3 mm x 20 mm Trevo ProVue device was again deployed as above. With proximal flow arrest in the left common carotid artery by inflating the balloon of the [facility] guide catheter, the combination of the retrieval device, microcatheter and the Catalyst guide catheter were retrieved and removed. Again aspiration was applied with a 60 mL syringe at the hub of the [facility] guide catheter, and the Penumbra suction device at the hub of the Catalyst guide catheter. Aspiration was continued as flow arrest was reversed. A control arteriogram performed again through the [facility] guide catheter in the left common carotid artery continued to demonstrate no real opacification of the inferior division of the superior branch of the duplicated left middle cerebral artery. A controlled arteriogram performed was then made using the combination of the Trevo ProVue microcatheter inside of a 5 French 132 cm Catalyst guide catheter which was advanced to the left middle cerebral artery over a 0.014 inch Softip Synchro micro guidewire. Again access into the occluded inferior division was obtained with the micro guidewire followed by the microcatheter which was positioned in the M3 region of the left middle cerebral artery. The guidewire was removed. Again after having verified safe position of the tip of the microcatheter, a 5 mm x 33 mm Embotrap  retrieval device was advanced to the distal end of the microcatheter. The proximal and the distal landing zones were defined. With proximal flow arrest in the left internal carotid artery, the combination of the retrieval device, the microcatheter, and the 5 French 132 cm Catalyst guide catheter were retrieved and removed as constant aspiration was applied with a 60 mL syringe at the hub of the [facility] guide  catheter, and the Penumbra suction device at the hub of the Catalyst guide catheter. The combination was retrieved and removed. A clot was noted entangled and the cells of the retrieval device. The aspirate was continued as flow arrest was reversed by deflating the balloon in the left common carotid artery. An arteriogram performed with free back bleed of blood at the hub of the [facility] guide catheter demonstrated now complete revascularization of the previously occluded inferior division of the superior branch of the left middle cerebral artery. A control arteriogram performed centered over the whole head now demonstrated reperfusion of the previously noted hypoperfused parietal and the posterior frontal regions. A TICI 3 revascularization had been achieved of the left middle cerebral distribution. A control arteriogram performed through the [facility] guide catheter in the left common carotid artery centered over the carotid bifurcation continued to demonstrate 60% stenosis of the proximal left internal carotid artery at the bulb. No evidence of dissections or of intraluminal filling defects were noted extra cranially. The 8 [place] FlowGate guide catheter, and the 8 [place] neurovascular sheath were then removed over a J-tip guidewire for an 8 [place] Pinnacle sheath. This was then removed with the successful application of a 7 [place] Exo-Seal closure device achieving hemostasis. Distal pulses in both feet were Dopplerable in the dorsalis pedis, and the posterior tibial regions bilaterally unchanged from prior  to the beginning of the procedure. The right groin appeared soft without evidence of hematoma or bleeding. A Dyna CT of the brain performed demonstrated a focal area of hyperdensity in the subcortical insular area lateral to the putamen and a small one just cranial and lateral to this. No known gross hemorrhages, or mass effect or midline shift was noted. The patient was left intubated on account of her difficulty with comprehension prior to the intubation. The patient was then transferred to the neuro ICU to continue with post thrombectomy management. IMPRESSION: Status post endovascular complete revascularization of occluded inferior division of the superior duplicated left middle cerebral artery with 2 passes with the 3 mm x 20 mm Trevo ProVue retrieval device, and 1 pass with the Embotrap 5 mm x 33 mm retrieval device achieving a revascularization. PLAN: Follow-up in the clinic in 4 to 6 weeks post discharge. Electronically Signed   By: Luanne Bras M.D.   On: 07/06/2018 14:56   Carotid (at [place] Only)  Result Date: 07/07/2018 Carotid Arterial Duplex Study Indications: CVA. Performing Technologist: Oliver Hum RVT  Examination Guidelines: A complete evaluation includes B-mode imaging, spectral Doppler, color Doppler, and power Doppler as needed of all accessible portions of each vessel. Bilateral testing is considered an integral part of a complete examination. Limited examinations for reoccurring indications may be performed as noted.  Right Carotid Findings: +----------+--------+-------+--------+--------------------------------+--------+           PSV cm/sEDV    StenosisDescribe                        Comments                   cm/s                                                    +----------+--------+-------+--------+--------------------------------+--------+ CCA Prox  65      17  smooth and heterogenous                   +----------+--------+-------+--------+--------------------------------+--------+ CCA Distal53      13             smooth, heterogenous and                                                  calcific                                 +----------+--------+-------+--------+--------------------------------+--------+ ICA Prox  56      16             smooth, heterogenous and                                                  calcific                                 +----------+--------+-------+--------+--------------------------------+--------+ ICA Distal73      22                                             tortuous +----------+--------+-------+--------+--------------------------------+--------+ ECA       331     37             calcific                                 +----------+--------+-------+--------+--------------------------------+--------+ +----------+--------+-------+--------+-------------------+           PSV cm/sEDV cmsDescribeArm Pressure (mmHG) +----------+--------+-------+--------+-------------------+ [id number]                                        +----------+--------+-------+--------+-------------------+ +---------+--------+--+--------+--+---------+ VertebralPSV cm/s60EDV cm/s20Antegrade +---------+--------+--+--------+--+---------+  Left Carotid Findings: +----------+--------+-------+--------+--------------------------------+--------+           PSV cm/sEDV    StenosisDescribe                        Comments                   cm/s                                                    +----------+--------+-------+--------+--------------------------------+--------+ CCA Prox  70      20             smooth and heterogenous                  +----------+--------+-------+--------+--------------------------------+--------+ CCA Distal68      14             smooth, heterogenous and  calcific                                 +----------+--------+-------+--------+--------------------------------+--------+ ICA Prox  166     44             smooth, heterogenous and                                                  calcific                                 +----------+--------+-------+--------+--------------------------------+--------+ ICA Mid   102     23                                             tortuous +----------+--------+-------+--------+--------------------------------+--------+ ICA Distal56      18                                             tortuous +----------+--------+-------+--------+--------------------------------+--------+ ECA       57      11             calcific                                 +----------+--------+-------+--------+--------------------------------+--------+ +----------+--------+--------+--------+-------------------+ SubclavianPSV cm/sEDV cm/sDescribeArm Pressure (mmHG) +----------+--------+--------+--------+-------------------+           102                                         +----------+--------+--------+--------+-------------------+ +---------+--------+--+--------+--+---------+ VertebralPSV cm/s54EDV cm/s15Antegrade +---------+--------+--+--------+--+---------+  Summary: Right Carotid: Velocities in the right ICA are consistent with a 1-39% stenosis. Left Carotid: Velocities in the left ICA are consistent with a 40-59% stenosis. Vertebrals: Bilateral vertebral arteries demonstrate antegrade flow. *See table(s) above for measurements and observations.  Electronically signed by Antony Contras MD on 07/07/2018 at 8:02:21 AM.    Final     Labs:  CBC: Recent Labs    05/12/18 1212 07/06/18 0703  WBC 12.1* 9.9  HGB 13.7 11.3*  HCT 40.8 34.8*  PLT 414 297    COAGS: No results for input(s): INR, APTT in the last 8760 hours.  BMP: Recent Labs    05/12/18 1212 07/06/18 0703  NA 141 140  K  5.5* 3.8  CL 100 111  CO2 25 24  GLUCOSE 87 101*  BUN 25 9  CALCIUM 10.5* 8.7*  CREATININE 1.20* 0.89  GFRNONAA 44* >60  GFRAA 51* >60    LIVER FUNCTION TESTS: Recent Labs    05/12/18 1212  BILITOT 0.7  AST 25  ALT 19  ALKPHOS 119*  PROT 6.6  ALBUMIN 4.3    Assessment and Plan:  Left MCA superior division distal M2/M3 junction occlusion s/p emergent mechanical thrombectomy achieving a TICI 3 revascularization 07/06/2018 by Dr. Estanislado Pandy. Patient's condition improving- still can wiggle bilateral toes and  now with spontaneous movement of left arm but none of right arm. Right groin incision stable. Plan for extubation today. Plan to follow-up with Dr. Estanislado Pandy in clinic 4-6 weeks after discharge. Appreciate and agree with neurology management. IR to follow.   Electronically Signed: Earley Abide, PA-C 07/07/2018, 9:49 AM   I spent a total of 15 Minutes at the the patient's bedside AND on the patient's hospital floor or unit, greater than 50% of which was counseling/coordinating care for left MCA superior division distal M2/M3 junction occlusion s/p revascularization.

## 2018-07-07 NOTE — Progress Notes (Signed)
  Echocardiogram 2D Echocardiogram has been performed.  Jannett Celestine 07/07/2018, 1:25 PM

## 2018-07-07 NOTE — Plan of Care (Signed)
Pt extubated this morning. SpO2 96% on RA.

## 2018-07-07 NOTE — Progress Notes (Signed)
NAME:  Kaitlyn Underwood, MRN:  [id number], DOB:  24-Dec-1940, LOS: 2 ADMISSION DATE:  07/05/2018, CONSULTATION DATE:  07/06/2018 REFERRING MD:  Estanislado Pandy - Neuroradiology, CHIEF COMPLAINT:  CVA   Brief History   77 year old woman with aphasia and right sided weakness. Received TPA and underwent M2 thrombectomy.  Interim history/subjective:  Weaned sedation overnight. Able to follow some commands. Cleviprex. No signs of discomfort.  Objective   Blood pressure (!) 132/55, pulse 97, temperature 99.2 F (37.3 C), temperature source Axillary, resp. rate (!) 22, height 5\' 2"  (1.575 m), weight 90.7 kg, SpO2 100 %.    Vent Mode: PSV;CPAP FiO2 (%):  [40 %] 40 % Set Rate:  [16 bmp] 16 bmp Vt Set:  [400 mL] 400 mL PEEP:  [5 cmH20] 5 cmH20 Pressure Support:  [5 cmH20] 5 cmH20 Plateau Pressure:  [13 cmH20-15 cmH20] 13 cmH20   Intake/Output Summary (Last 24 hours) at 07/07/2018 0817 Last data filed at 07/07/2018 0800 Gross per 24 hour  Intake 2131.07 ml  Output 500 ml  Net 1631.07 ml   Filed Weights   07/06/18 0208  Weight: 90.7 kg    Examination: General: obese HENT: ETT, OGT in place Lungs: rhonchi, tolerating SBT 5/5 Cardiovascular: HS normal - in atrial fibrillation Abdomen: soft Extremities: No groin hematoma Neuro: awake and following commands in all 4 limbs GU: Normal urine output   Assessment & Plan:  Critically ill due to respiratory failure from inability to protect airway. Status post stroke revascularization Known atrial fibrillation with high CHADS score.  COPD Dyslipidemia with low LDL and low normal HDL  Plan SBT with plan to extubate this morning. Will need anticoagulation for long term stroke prevention - Recommend Eliquis 5mg  bid to start on 11/22. BP control target <120-140 - restart home metoprolol used for AF rate control to start  Continue current bronchodilator regimen. Start Crestor at 10mg /day.  Best practice:  Diet: NPO Pain/Anxiety/Delirium  protocol (if indicated): propofol to be d/c'd once extubated. VAP protocol (if indicated): bundle DVT prophylaxis: heparin [place] GI prophylaxis: famotidine  Glucose control:  Phase 1 Mobility: Bedrest Code Status: Full Family Communication: No update Disposition:   Labs   CBC: Recent Labs  Lab 07/06/18 0703  WBC 9.9  NEUTROABS 7.0  HGB 11.3*  HCT 34.8*  MCV 94.6  PLT 163    Basic Metabolic Panel: Recent Labs  Lab 07/06/18 0703  NA 140  K 3.8  CL 111  CO2 24  GLUCOSE 101*  BUN 9  CREATININE 0.89  CALCIUM 8.7*  MG 1.8  PHOS 3.7   GFR: Estimated Creatinine Clearance: 56.3 mL/min (by C-G formula based on SCr of 0.89 mg/dL). Recent Labs  Lab 07/06/18 0703  WBC 9.9    Liver Function Tests: No results for input(s): AST, ALT, ALKPHOS, BILITOT, PROT, ALBUMIN in the last 168 hours. No results for input(s): LIPASE, AMYLASE in the last 168 hours. No results for input(s): AMMONIA in the last 168 hours.  ABG    Component Value Date/Time   PHART 7.324 (L) 07/06/2018 0224   PCO2ART 48.4 (H) 07/06/2018 0224   PO2ART 174.0 (H) 07/06/2018 0224   HCO3 25.1 07/06/2018 0224   TCO2 27 07/06/2018 0224   ACIDBASEDEF 1.0 07/06/2018 0224   O2SAT 99.0 07/06/2018 0224     Coagulation Profile: No results for input(s): INR, PROTIME in the last 168 hours.  Cardiac Enzymes: No results for input(s): CKTOTAL, CKMB, CKMBINDEX, TROPONINI in the last 168 hours.  HbA1C: Hgb  A1c MFr Bld  Date/Time Value Ref Range Status  07/06/2018 07:01 AM 5.6 4.8 - 5.6 % Final    Comment:    (NOTE) Pre diabetes:          5.7%-6.4% Diabetes:              >6.4% Glycemic control for   <7.0% adults with diabetes     CBG: Recent Labs  Lab 07/06/18 1531 07/06/18 1937 07/06/18 2320 07/07/18 0337 07/07/18 0753  GLUCAP 79 92 74 90 91    Critical care time: 63 mins    Kipp Brood, MD [facility] ICU Physician [place]  Pager: [phone] Mobile: [phone] After  hours: [phone].

## 2018-07-08 ENCOUNTER — Inpatient Hospital Stay (HOSPITAL_COMMUNITY): Payer: PPO

## 2018-07-08 LAB — BASIC METABOLIC PANEL
Anion gap: 8 (ref 5–15)
BUN: 10 mg/dL (ref 8–23)
CO2: 24 mmol/L (ref 22–32)
Calcium: 9.3 mg/dL (ref 8.9–10.3)
Chloride: 111 mmol/L (ref 98–111)
Creatinine, Ser: 0.94 mg/dL (ref 0.44–1.00)
GFR calc Af Amer: >60 mL/min (ref >60)
GFR, EST NON AFRICAN AMERICAN: 57 mL/min — AB (ref >60)
GLUCOSE: 90 mg/dL (ref 70–99)
POTASSIUM: 3.5 mmol/L (ref 3.5–5.1)
Sodium: 143 mmol/L (ref 135–145)

## 2018-07-08 LAB — GLUCOSE, CAPILLARY
GLUCOSE-CAPILLARY: 108 mg/dL — AB (ref 70–99)
GLUCOSE-CAPILLARY: 90 mg/dL (ref 70–99)
Glucose-Capillary: 80 mg/dL (ref 70–99)
Glucose-Capillary: 74 mg/dL (ref 70–99)
Glucose-Capillary: 115 mg/dL — ABNORMAL HIGH (ref 70–99)

## 2018-07-08 LAB — CBC
HEMATOCRIT: 32.8 % — AB (ref 36.0–46.0)
Hemoglobin: 10.6 g/dL — ABNORMAL LOW (ref 12.0–15.0)
MCH: 31.1 pg (ref 26.0–34.0)
MCHC: 32.3 g/dL (ref 30.0–36.0)
MCV: 96.2 fL (ref 80.0–100.0)
Platelets: 275 10*3/uL (ref 150–400)
RBC: 3.41 MIL/uL — ABNORMAL LOW (ref 3.87–5.11)
RDW: 13.9 % (ref 11.5–15.5)
WBC: 9.7 10*3/uL (ref 4.0–10.5)
nRBC: 0 % (ref 0.0–0.2)

## 2018-07-08 MED ORDER — POTASSIUM CHLORIDE CRYS ER 20 MEQ PO TBCR
40.0000 meq | EXTENDED_RELEASE_TABLET | Freq: Once | ORAL | Status: AC
  Administered 2018-07-08: 40 meq via ORAL
  Filled 2018-07-08: qty 2

## 2018-07-08 MED ORDER — SERTRALINE HCL 50 MG PO TABS
25.0000 mg | ORAL_TABLET | Freq: Every day | ORAL | Status: DC
  Administered 2018-07-08 – 2018-07-10 (×3): 25 mg via ORAL
  Filled 2018-07-08 (×3): qty 1

## 2018-07-08 MED ORDER — RESOURCE THICKENUP CLEAR PO POWD
ORAL | Status: DC | PRN
  Filled 2018-07-08: qty 125

## 2018-07-08 MED ORDER — PANTOPRAZOLE SODIUM 40 MG PO TBEC
40.0000 mg | DELAYED_RELEASE_TABLET | Freq: Every day | ORAL | Status: DC
  Administered 2018-07-08 – 2018-07-10 (×3): 40 mg via ORAL
  Filled 2018-07-08 (×3): qty 1

## 2018-07-08 MED ORDER — ASPIRIN 300 MG RE SUPP
300.0000 mg | Freq: Once | RECTAL | Status: AC
  Administered 2018-07-08: 300 mg via RECTAL
  Filled 2018-07-08: qty 1

## 2018-07-08 MED ORDER — APIXABAN 5 MG PO TABS
5.0000 mg | ORAL_TABLET | Freq: Two times a day (BID) | ORAL | Status: DC
  Administered 2018-07-09 – 2018-07-10 (×3): 5 mg via ORAL
  Filled 2018-07-08 (×3): qty 1

## 2018-07-08 NOTE — Progress Notes (Signed)
Rehab Admissions Coordinator Note:  Patient was screened by Cleatrice Burke for appropriateness for an Inpatient Acute Rehab Consult per PT and OT recommendations.  At this time, we are recommending Inpatient Rehab consult.  Cleatrice Burke 07/08/2018, 7:17 PM  I can be reached at [phone].

## 2018-07-08 NOTE — Evaluation (Signed)
Occupational Therapy Evaluation Patient Details Name: Kaitlyn Underwood MRN: [id number] DOB: [birth year] Today's Date: 07/08/2018    History of Present Illness 77 y.o. female admitted on 07/05/18 for R sided weakness and aphasia.  Pt treated for possible stroke after CT was negative for hemorrhage with IV tPA and underwent Lt common carotid arteriogram floowed by complete revascularization of of occluded Lt MCA.  MRI revealed L MCA infarct and associated petechial hemmorrhage.  Pt with significant PMH of pre-diabetes, HRN, CAD, COPD, chronic systolic heart failure, and A-fib.                                                                                         Clinical Impression   Patient agreeable to session. Pt unable to provide PLOF due to expressive aphasia.  Admitted for above and limited by problem list below.  Requires mod assist +2 for stand pivot transfers to [facility] and recliner, mod assist for UB ADLs, max-total assist for LB ADLs, and mod assist for bed mobility.  Patient able to respond to yes/no questions with approx 75% accuracy and able to follow 1 step commands consistently.  Pt will benefit from continued OT services while admitted and after dc at SNF level in order to optimize independence and return to PLOF.  Will continue to follow.     Follow Up Recommendations  CIR    Equipment Recommendations  Other (comment)(TBD)    Recommendations for Other Services Rehab consult     Precautions / Restrictions Precautions Precautions: Fall Precaution Comments: right sided weakness left gaze preference Restrictions Weight Bearing Restrictions: No      Mobility Bed Mobility Overal bed mobility: Needs Assistance Bed Mobility: Supine to Sit     Supine to sit: Mod assist;HOB elevated     General bed mobility comments: see PT note  Transfers Overall transfer level: Needs assistance Equipment used: 2 person hand held assist Transfers: Sit to/from [facility] Sit  to Stand: +2 physical assistance;Mod assist Stand pivot transfers: +2 physical assistance;Mod assist       General transfer comment: mod assist +2 for transfers to L (strong side) to [facility] then recliner, cueing for sequencing, safety and posture    Balance Overall balance assessment: Needs assistance Sitting-balance support: Feet supported;Single extremity supported Sitting balance-Leahy Scale: Poor Sitting balance - Comments: min to min guard for safety    Standing balance support: Bilateral upper extremity supported;During functional activity Standing balance-Leahy Scale: Poor Standing balance comment: reliant on external support                           ADL either performed or assessed with clinical judgement   ADL Overall ADL's : Needs assistance/impaired     Grooming: Minimal assistance;Sitting   Upper Body Bathing: Moderate assistance;Sitting   Lower Body Bathing: Maximal assistance;+2 for physical assistance;Sit to/from stand   Upper Body Dressing : Moderate assistance;Sitting   Lower Body Dressing: +2 for physical assistance;Sit to/from stand;Total assistance   Toilet Transfer: Moderate assistance;+2 for physical assistance;BSC;Stand-pivot;Cueing for safety;Cueing for sequencing   Toileting- Clothing Manipulation and Hygiene: Total assistance;+2 for physical  assistance;Sit to/from stand       Functional mobility during ADLs: Moderate assistance;+2 for physical assistance;Cueing for safety;Cueing for sequencing General ADL Comments: limited by aphasia, cognition, R sided weakness, and impaired balance      Vision Patient Visual Report: Other (comment)(unable to report due to aphasia) Additional Comments: difficult to assess, L gaze preference but able to scan towards R with cueing     Perception     Praxis      Pertinent Vitals/Pain Pain Assessment: Faces Faces Pain Scale: Hurts a little bit Pain Location: head, difficult to localize Pain  Descriptors / Indicators: Aching Pain Intervention(s): Limited activity within patient's tolerance;Monitored during session;Repositioned     Hand Dominance     Extremity/Trunk Assessment Upper Extremity Assessment Upper Extremity Assessment: RUE deficits/detail RUE Deficits / Details: R UE with functional weakness, grossly 3/5 MMT  RUE Coordination: decreased gross motor   Lower Extremity Assessment Lower Extremity Assessment: Defer to PT evaluation RLE Deficits / Details: right leg with functional weakness as observed when moving to EOB (unable to lift R leg significantly against gravity to progress it to EOB without assistance, some instability in standing and stepping around having difficulty progressing it.     Cervical / Trunk Assessment Cervical / Trunk Assessment: Other exceptions Cervical / Trunk Exceptions: In standing, pt is very flexed, not sure if this is new or baseline.    Communication Communication Communication: Expressive difficulties;Other (comment)(per SLP note some mild receptive aphasia as well)   Cognition Arousal/Alertness: Awake/alert Behavior During Therapy: WFL for tasks assessed/performed Overall Cognitive Status: Difficult to assess Area of Impairment: Attention;Memory;Following commands;Safety/judgement;Awareness;Problem solving                   Current Attention Level: Sustained Memory: Decreased recall of precautions Following Commands: Follows one step commands with increased time Safety/Judgement: Decreased awareness of safety;Decreased awareness of deficits Awareness: Intellectual Problem Solving: Decreased initiation;Difficulty sequencing;Requires verbal cues;Requires tactile cues General Comments: approx 75% accuracy to yes/no questions, difficulty sequencing tasks   General Comments  VSS     Exercises     Shoulder Instructions      Home Living Family/patient expects to be discharged to:: Private residence                                  Additional Comments: Pt unable to report due to severe expressive aphasia.        Prior Functioning/Environment          Comments: assumed independent, chart did not reveal any prior therapy, no family present to report.         OT Problem List: Decreased strength;Decreased activity tolerance;Impaired balance (sitting and/or standing);Impaired vision/perception;Decreased coordination;Decreased cognition;Decreased safety awareness;Decreased knowledge of use of DME or AE;Decreased knowledge of precautions      OT Treatment/Interventions: Self-care/ADL training;Neuromuscular education;Energy conservation;DME and/or AE instruction;Therapeutic activities;Visual/perceptual remediation/compensation;Cognitive remediation/compensation;Patient/family education;Balance training    OT Goals(Current goals can be found in the care plan section) Acute Rehab OT Goals Patient Stated Goal: unable to state due to expressive difficulties OT Goal Formulation: Patient unable to participate in goal setting Time For Goal Achievement: 07/22/18 Potential to Achieve Goals: Good  OT Frequency: Min 2X/week   Barriers to D/C:            Co-evaluation PT/OT/SLP Co-Evaluation/Treatment: Yes Reason for Co-Treatment: Complexity of the patient's impairments (multi-system involvement);Necessary to address cognition/behavior during functional activity;For patient/therapist safety;To address functional/ADL  transfers PT goals addressed during session: Mobility/safety with mobility;Balance OT goals addressed during session: ADL's and self-care;Other (comment)(mobility)      AM-PAC PT "6 Clicks" Daily Activity     Outcome Measure Help from another person eating meals?: A Little Help from another person taking care of personal grooming?: A Little Help from another person toileting, which includes using toliet, bedpan, or urinal?: Total Help from another person bathing (including washing,  rinsing, drying)?: A Lot Help from another person to put on and taking off regular upper body clothing?: A Lot Help from another person to put on and taking off regular lower body clothing?: Total 6 Click Score: 12   End of Session Equipment Utilized During Treatment: Gait belt Nurse Communication: Mobility status  Activity Tolerance: Patient tolerated treatment well Patient left: in chair;with call bell/phone within reach;with chair alarm set  OT Visit Diagnosis: Other abnormalities of gait and mobility (R26.89);Muscle weakness (generalized) (M62.81);Cognitive communication deficit (R41.841) Symptoms and signs involving cognitive functions: Cerebral infarction                Time: 1017-5102 OT Time Calculation (min): 25 min Charges:  OT General Charges $OT Visit: 1 Visit OT Evaluation $OT Eval Moderate Complexity: Nelson, OT Acute Rehabilitation Services Pager [phone] Office [phone]   Delight Stare 07/08/2018, 5:10 PM

## 2018-07-08 NOTE — Progress Notes (Signed)
Referring Physician(s): CODE STROKE- Greta Doom  Supervising Physician: Luanne Bras  Patient Status:  [facility] - In-pt  Chief Complaint: None  Subjective:  Left MCA superior division distal M2/M3 junction occlusion s/p emergent mechanical thrombectomy achieving a TICI 3 revascularization 07/06/2018 by Dr. Estanislado Pandy. Patient awake and alert laying in bed. Speech at bedside for evaluation. Can spontaneously move all extremities. Demonstrates expressive aphasia and right facial droop. Right groin incision c/d/i.   Allergies: Sulfa antibiotics  Medications: Prior to Admission medications   Medication Sig Start Date End Date Taking? Authorizing Provider  albuterol (PROVENTIL HFA;VENTOLIN HFA) 108 (90 Base) MCG/ACT inhaler Inhale 2 puffs into the lungs every 6 (six) hours as needed for wheezing or shortness of breath.    Yes [provider]  Ascorbic Acid (VITAMIN C) 1000 MG tablet Take 1,000 mg by mouth 2 (two) times daily.   Yes [provider]  aspirin EC 325 MG tablet Take 1 tablet (325 mg total) by mouth daily. 04/03/17  Yes Revankar, Reita Cliche, MD  budesonide (PULMICORT) 0.5 MG/2ML nebulizer solution Take 0.5 mg by nebulization daily.    Yes [provider]  captopril (CAPOTEN) 50 MG tablet TAKE 1 TABLET BY MOUTH THREE TIMES DAILY Patient taking differently: Take 50 mg by mouth 3 (three) times daily.  06/23/18  Yes Revankar, Reita Cliche, MD  digoxin (LANOXIN) 0.125 MG tablet TAKE 1 TABLET BY MOUTH ONCE DAILY Patient taking differently: Take 0.125 mg by mouth daily.  05/04/18  Yes Revankar, Reita Cliche, MD  febuxostat (ULORIC) 40 MG tablet Take 40 mg by mouth daily. 06/30/18  Yes [provider]  furosemide (LASIX) 40 MG tablet Take 40 mg by mouth daily.   Yes [provider]  gabapentin (NEURONTIN) 300 MG capsule Take 300 mg by mouth 3 (three) times daily. 05/11/18  Yes [provider]  Glucosamine Sulfate 500 MG TABS Take  500 mg by mouth daily.   Yes [provider]  ipratropium-albuterol (DUONEB) 0.5-2.5 (3) MG/3ML SOLN Take 3 mLs by nebulization every 6 (six) hours as needed (shortness of breath/wheezing.).  05/13/17  Yes [provider]  metoprolol succinate (TOPROL-XL) 50 MG 24 hr tablet Take 1 tablet (50 mg total) by mouth daily. 05/04/18  Yes Revankar, Reita Cliche, MD  montelukast (SINGULAIR) 10 MG tablet Take 10 mg by mouth daily.   Yes [provider]  nitroGLYCERIN (NITROSTAT) 0.4 MG SL tablet Place 0.4 mg under the tongue every 5 (five) minutes as needed for chest pain.  11/16/15  Yes [provider]  pantoprazole (PROTONIX) 40 MG tablet Take 40 mg by mouth daily.   Yes [provider]  traMADol (ULTRAM) 50 MG tablet Take 50 mg by mouth daily as needed for moderate pain.    Yes [provider]  vitamin E 400 UNIT capsule Take 400 Units by mouth daily.   Yes [provider]  captopril (CAPOTEN) 50 MG tablet Take 1 tablet (50 mg total) by mouth 3 (three) times daily. Patient not taking: Reported on 07/06/2018 06/02/17   Revankar, Reita Cliche, MD     Vital Signs: BP 132/77   Pulse (!) 59   Temp 97.9 F (36.6 C) (Oral)   Resp 20   Ht 5\' 2"  (1.575 m)   Wt 200 lb (90.7 kg)   SpO2 95%   BMI 36.58 kg/m   Physical Exam  Constitutional: She appears well-developed and well-nourished. No distress.  Pulmonary/Chest: Effort normal. No respiratory distress.  Neurological:  Awake and alert. Demonstrates expressive aphasia. PERRL bilaterally. EOMs intact bilaterally without nystagmus or subjective diplopia. Visual fields not assessed. Demonstrates right facial droop. Tongue midline. Can spontaneously move all extremities. No pronator drift. Fine motor and coordination not assessed. Gait not assessed. Romberg not assessed. Heel to toe not assessed. Distal pulses palpable bilaterally with Doppler.  Skin: Skin is warm and dry.  Right groin incision  soft without active bleeding or hematoma.  Nursing note and vitals reviewed.   Imaging: [name] HU Contrast  Result Date: 07/06/2018 CLINICAL DATA:  Follow-up examination for acute stroke. 77 year old female who presented with aphasia and right-sided weakness, status post tPA and catheter directed thrombectomy. EXAM: MRI HEAD WITHOUT CONTRAST MRA HEAD WITHOUT CONTRAST TECHNIQUE: Multiplanar, multiecho pulse sequences of the brain and surrounding structures were obtained without intravenous contrast. Angiographic images of the head were obtained using MRA technique without contrast. COMPARISON:  Prior CT and arteriogram from 07/05/2018. FINDINGS: MRI HEAD FINDINGS Brain: Generalized age-related cerebral atrophy. Patchy and confluent T2/FLAIR hyperintensity within the periventricular and deep white matter both cerebral hemispheres, most consistent with chronic small vessel ischemic disease, moderate in nature. Patchy small moderate volume restricted diffusion seen involving the left insula and overlying left frontal operculum, extending to involve the left periventricular white matter, consistent with acute left MCA territory infarct (series 3, image 26). Associated mild localized gyral swelling and edema without significant mass effect. Patchy susceptibility artifact within the area of infarction consistent with associated petechial hemorrhage (series 6, image 54). No frank hemorrhagic transformation by MRI. No other evidence for acute or subacute ischemia. Gray-white matter differentiation otherwise maintained. Few small chronic micro hemorrhages noted involving the temporal lobes bilaterally. Probable 12 mm densely calcified meningioma overlies the anterior left frontal convexity. No associated edema or mass effect. No other mass lesion. No midline shift or mass effect. No hydrocephalus. Pituitary gland within normal limits. Vascular: Major intracranial vascular flow voids maintained. Skull and upper  cervical spine: Craniocervical junction within normal limits. Bone marrow signal intensity normal. Mild soft tissue swelling noted at the left frontoparietal scalp near the vertex (series 11, image 12). Sinuses/Orbits: Globes and orbital soft tissues within normal limits. Paranasal sinuses are clear. Small left mastoid effusion, of doubtful significance. Inner ear structures grossly normal. Other: None. MRA HEAD FINDINGS ANTERIOR CIRCULATION: Distal cervical segments of the internal carotid arteries are widely patent with antegrade flow. Petrous, cavernous, and supraclinoid segments widely patent without stenosis. Origin of the ophthalmic arteries patent bilaterally. ICA termini well perfused and symmetric. A1 segments patent bilaterally. Normal anterior communicating artery. Anterior cerebral arteries widely patent to their distal aspects without stenosis. Right M1 widely patent without stenosis. Normal right MCA bifurcation. No proximal right M2 occlusion. Distal right MCA branches well perfused. Left M1 duplicated and/or bifurcates early at the origin. No M1 stenosis or occlusion. No proximal left M2 occlusion. Distal left MCA branches well perfused. POSTERIOR CIRCULATION: Vertebral arteries patent to the vertebrobasilar junction without stenosis. Right vertebral artery dominant. Posterior inferior cerebral arteries not visualized. Basilar widely patent to its distal aspect without stenosis. Superior cerebral arteries patent bilaterally. Posterior cerebral arteries well perfused to their distal aspects without stenosis. Prominent left posterior communicating artery noted. IMPRESSION: MRI HEAD IMPRESSION: 1. Small to moderate evolving acute left MCA territory infarct as above. Associated petechial hemorrhage without frank hemorrhagic transformation or significant mass effect. 2. Underlying moderate chronic microvascular ischemic disease. MRA HEAD IMPRESSION: 1. Negative intracranial MRA. No evidence for large  vessel occlusion status post  catheter directed thrombectomy. No hemodynamically significant or correctable stenosis identified. 2. Duplicated left M1 segment. Electronically Signed   By: Jeannine Boga M.D.   On: 07/06/2018 19:43   Mr Brain Wo Contrast  Result Date: 07/06/2018 CLINICAL DATA:  Follow-up examination for acute stroke. 77 year old female who presented with aphasia and right-sided weakness, status post tPA and catheter directed thrombectomy. EXAM: MRI HEAD WITHOUT CONTRAST MRA HEAD WITHOUT CONTRAST TECHNIQUE: Multiplanar, multiecho pulse sequences of the brain and surrounding structures were obtained without intravenous contrast. Angiographic images of the head were obtained using MRA technique without contrast. COMPARISON:  Prior CT and arteriogram from 07/05/2018. FINDINGS: MRI HEAD FINDINGS Brain: Generalized age-related cerebral atrophy. Patchy and confluent T2/FLAIR hyperintensity within the periventricular and deep white matter both cerebral hemispheres, most consistent with chronic small vessel ischemic disease, moderate in nature. Patchy small moderate volume restricted diffusion seen involving the left insula and overlying left frontal operculum, extending to involve the left periventricular white matter, consistent with acute left MCA territory infarct (series 3, image 26). Associated mild localized gyral swelling and edema without significant mass effect. Patchy susceptibility artifact within the area of infarction consistent with associated petechial hemorrhage (series 6, image 54). No frank hemorrhagic transformation by MRI. No other evidence for acute or subacute ischemia. Gray-white matter differentiation otherwise maintained. Few small chronic micro hemorrhages noted involving the temporal lobes bilaterally. Probable 12 mm densely calcified meningioma overlies the anterior left frontal convexity. No associated edema or mass effect. No other mass lesion. No midline shift or mass  effect. No hydrocephalus. Pituitary gland within normal limits. Vascular: Major intracranial vascular flow voids maintained. Skull and upper cervical spine: Craniocervical junction within normal limits. Bone marrow signal intensity normal. Mild soft tissue swelling noted at the left frontoparietal scalp near the vertex (series 11, image 12). Sinuses/Orbits: Globes and orbital soft tissues within normal limits. Paranasal sinuses are clear. Small left mastoid effusion, of doubtful significance. Inner ear structures grossly normal. Other: None. MRA HEAD FINDINGS ANTERIOR CIRCULATION: Distal cervical segments of the internal carotid arteries are widely patent with antegrade flow. Petrous, cavernous, and supraclinoid segments widely patent without stenosis. Origin of the ophthalmic arteries patent bilaterally. ICA termini well perfused and symmetric. A1 segments patent bilaterally. Normal anterior communicating artery. Anterior cerebral arteries widely patent to their distal aspects without stenosis. Right M1 widely patent without stenosis. Normal right MCA bifurcation. No proximal right M2 occlusion. Distal right MCA branches well perfused. Left M1 duplicated and/or bifurcates early at the origin. No M1 stenosis or occlusion. No proximal left M2 occlusion. Distal left MCA branches well perfused. POSTERIOR CIRCULATION: Vertebral arteries patent to the vertebrobasilar junction without stenosis. Right vertebral artery dominant. Posterior inferior cerebral arteries not visualized. Basilar widely patent to its distal aspect without stenosis. Superior cerebral arteries patent bilaterally. Posterior cerebral arteries well perfused to their distal aspects without stenosis. Prominent left posterior communicating artery noted. IMPRESSION: MRI HEAD IMPRESSION: 1. Small to moderate evolving acute left MCA territory infarct as above. Associated petechial hemorrhage without frank hemorrhagic transformation or significant mass effect.  2. Underlying moderate chronic microvascular ischemic disease. MRA HEAD IMPRESSION: 1. Negative intracranial MRA. No evidence for large vessel occlusion status post catheter directed thrombectomy. No hemodynamically significant or correctable stenosis identified. 2. Duplicated left M1 segment. Electronically Signed   By: Jeannine Boga M.D.   On: 07/06/2018 19:43   [place]  Result Date: 07/07/2018 INDICATION: Global aphasia. Right-sided hemiplegia. Occluded inferior division of the left middle cerebral artery M2  M3 region EXAM: 1. EMERGENT LARGE VESSEL OCCLUSION THROMBOLYSIS (POSTERIOR CIRCULATION) MEDICATIONS: No antibiotic was administered within 1 hour of the procedure. ANESTHESIA/SEDATION: General anesthesia CONTRAST:  Isovue 300 approximately 110 mL. FLUOROSCOPY TIME:  Fluoroscopy Time: 73 minutes 24 seconds (3818 mGy). COMPLICATIONS: None immediate. TECHNIQUE: Following a full explanation of the procedure along with the potential associated complications, an informed witnessed consent was obtained from the patient's husband. The risks of intracranial hemorrhage of 10%, worsening neurological deficit, ventilator dependency, death and inability to revascularize were all reviewed in detail with the patient's husband. The patient was then put under general anesthesia by the Department of Anesthesiology at [facility]. The right groin was prepped and draped in the usual sterile fashion. Thereafter using modified Seldinger technique, transfemoral access into the right common femoral artery was obtained without difficulty. Over a 0.035 inch guidewire a 5 French Pinnacle sheath was inserted. Through this, and also over a 0.035 inch guidewire a 5 [place] JB 1 catheter was advanced to the aortic arch region and selectively positioned in the left common carotid artery. FINDINGS: The left common carotid arteriogram demonstrates the left external carotid artery and its major branches to be widely  patent. The left internal carotid artery at the bulb demonstrates approximately 60% stenosis by the NASCET criteria secondary to a smooth posterior wall atherosclerotic plaque. Distal to this the left internal carotid artery is seen to opacify to the cranial skull base. The petrous, cavernous and supraclinoid segments are widely patent. A left posterior communicating artery is seen opacifying the left posterior cerebral artery distribution. 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 The inferior division from the superior duplicated branch demonstrates complete angiographic occlusion. The left anterior cerebral artery opacifies into the capillary and venous phases. The lateral projection of the whole head run demonstrates a large area of hypoperfusion involving the left parietal and posterior frontal regions. PROCEDURE: ENDOVASCULAR REVASCULARIZATION OF OCCLUDED INFERIOR DIVISION OF THE UPPER BRANCH OF THE LEFT MIDDLE CEREBRAL ARTERY, WITH 2 PASSES WITH THE TREVO PROVUE 3 MM X 20 MM RETRIEVAL DEVICE, AND 1 PASS WITH THE 5 MM X 33 MM EMBOTRAP RETRIEVAL DEVICE ACHIEVING A TICI 3 REVASCULARIZATION. The diagnostic JB 1 catheter in the left common carotid artery was exchanged over a 0.035 inch 300 cm Rosen exchange guidewire for an 8 French 55 cm Brite tip neurovascular sheath using biplane roadmap technique and constant fluoroscopic guidance. Good aspiration obtained from the hub of the neurovascular sheath. This was then connected to continuous heparinized saline infusion. Over the [employer] guidewire, an 8 [place] 85 cm FlowGate balloon guide catheter which had been prepped with 50% contrast and 50% heparinized saline infusion was positioned just proximal to the left common carotid bifurcation. The guidewire was removed. Good aspiration obtained from the hub of the [facility] guide catheter. Gentle contrast injection demonstrated no evidence of spasms, dissections or of  intraluminal filling defects. A combination of a Catalyst 5 French 132 cm guide catheter inside of which was an 021 Trevo ProVue microcatheter was advanced over a 0.014 inch Softip Synchro micro guidewire to the distal end of the [facility] guide catheter. With the micro guidewire leading with a J-tip configuration to avoid dissections or inducing spasm, the combination was navigated without difficulty to the supraclinoid left ICA. The superior division was then selected with the micro guidewire followed by the microcatheter. Access through the occluded inferior division was then obtained with the micro guidewire to the M2 M3 regions followed  by the microcatheter. The guidewire was removed. Good aspiration obtained from the hub of the microcatheter. A gentle contrast injection demonstrated slow distal antegrade flow. The microcatheter was then connected to continuous heparinized saline infusion. A 3 mm x 20 mm Trevo ProVue retrieval device was advanced to the distal end of microcatheter. The proximal and the distal landing zones were then defined. The O ring on the delivery microcatheter was loosened. With slight forward gentle traction with the right hand on the delivery micro guidewire, with the left hand the delivery microcatheter was retrieved unsheathing the retrieval device. A control arteriogram performed through the Catalyst guide catheter in the supraclinoid left ICA demonstrated partial revascularization of the inferior division of the left middle cerebral artery. With proximal flow arrest in the left common carotid artery at the site of bifurcation, the combination of the retrieval device, the microcatheter, and the Catalyst guide catheter was retrieved as constant aspiration was applied with a 60 mL syringe at the hub of the [facility] guide catheter, and a Penumbra vacuum suction device at the hub of the Catalyst guide catheter. The combinations were retrieved and removed. No evidence of clot was seen in  the retrieval device, or in the aspirate. Aspiration was continued as the proximal flow arrest was reversed. A control arteriogram performed through the [facility] guide catheter in the left common carotid artery demonstrated no change in the occluded inferior division of the superior branch of the duplicated left MCA. A second attempt was then made again using the above combination. Again access into the distal M2 M3 region of the occluded vessel was obtained with the micro guidewire followed by the microcatheter. After having verified safe position of tip of the microcatheter, the 3 mm x 20 mm Trevo ProVue device was again deployed as above. With proximal flow arrest in the left common carotid artery by inflating the balloon of the [facility] guide catheter, the combination of the retrieval device, microcatheter and the Catalyst guide catheter were retrieved and removed. Again aspiration was applied with a 60 mL syringe at the hub of the [facility] guide catheter, and the Penumbra suction device at the hub of the Catalyst guide catheter. Aspiration was continued as flow arrest was reversed. A control arteriogram performed again through the [facility] guide catheter in the left common carotid artery continued to demonstrate no real opacification of the inferior division of the superior branch of the duplicated left middle cerebral artery. A controlled arteriogram performed was then made using the combination of the Trevo ProVue microcatheter inside of a 5 French 132 cm Catalyst guide catheter which was advanced to the left middle cerebral artery over a 0.014 inch Softip Synchro micro guidewire. Again access into the occluded inferior division was obtained with the micro guidewire followed by the microcatheter which was positioned in the M3 region of the left middle cerebral artery. The guidewire was removed. Again after having verified safe position of the tip of the microcatheter, a 5 mm x 33 mm Embotrap retrieval device  was advanced to the distal end of the microcatheter. The proximal and the distal landing zones were defined. With proximal flow arrest in the left internal carotid artery, the combination of the retrieval device, the microcatheter, and the 5 French 132 cm Catalyst guide catheter were retrieved and removed as constant aspiration was applied with a 60 mL syringe at the hub of the [facility] guide catheter, and the Penumbra suction device at the hub of the Catalyst guide catheter. The combination was retrieved  and removed. A clot was noted entangled and the cells of the retrieval device. The aspirate was continued as flow arrest was reversed by deflating the balloon in the left common carotid artery. An arteriogram performed with free back bleed of blood at the hub of the [facility] guide catheter demonstrated now complete revascularization of the previously occluded inferior division of the superior branch of the left middle cerebral artery. A control arteriogram performed centered over the whole head now demonstrated reperfusion of the previously noted hypoperfused parietal and the posterior frontal regions. A TICI 3 revascularization had been achieved of the left middle cerebral distribution. A control arteriogram performed through the [facility] guide catheter in the left common carotid artery centered over the carotid bifurcation continued to demonstrate 60% stenosis of the proximal left internal carotid artery at the bulb. No evidence of dissections or of intraluminal filling defects were noted extra cranially. The 8 [place] FlowGate guide catheter, and the 8 [place] neurovascular sheath were then removed over a J-tip guidewire for an 8 [place] Pinnacle sheath. This was then removed with the successful application of a 7 [place] Exo-Seal closure device achieving hemostasis. Distal pulses in both feet were Dopplerable in the dorsalis pedis, and the posterior tibial regions bilaterally unchanged from prior to the beginning  of the procedure. The right groin appeared soft without evidence of hematoma or bleeding. A Dyna CT of the brain performed demonstrated a focal area of hyperdensity in the subcortical insular area lateral to the putamen and a small one just cranial and lateral to this. No known gross hemorrhages, or mass effect or midline shift was noted. The patient was left intubated on account of her difficulty with comprehension prior to the intubation. The patient was then transferred to the neuro ICU to continue with post thrombectomy management. IMPRESSION: Status post endovascular complete revascularization of occluded inferior division of the superior duplicated left middle cerebral artery with 2 passes with the 3 mm x 20 mm Trevo ProVue retrieval device, and 1 pass with the Embotrap 5 mm x 33 mm retrieval device achieving a revascularization. PLAN: Follow-up in the clinic in 4 to 6 weeks post discharge. Electronically Signed   By: Luanne Bras M.D.   On: 07/06/2018 14:56   Dg Chest Port 1 View  Result Date: 07/07/2018 CLINICAL DATA:  CHF. Hx of afib, chronic systolic heart failure, COPD, coronary artery disease, hypertension, pre-diabetes, left heart cath and coronary angiography(04/29/17). Former [phone]). EXAM: PORTABLE CHEST 1 VIEW COMPARISON:  07/06/2018 FINDINGS: Moderate enlargement of the cardiopericardial silhouette no overt edema. Atherosclerotic calcification of the aortic arch. The lungs appear otherwise clear. Degenerative glenohumeral spurring. IMPRESSION: 1. Moderate enlargement of the cardiopericardial silhouette, without edema. 2. Degenerative glenohumeral arthropathy bilaterally. Electronically Signed   By: Van Clines M.D.   On: 07/07/2018 11:48   Portable Chest X-ray  Result Date: 07/06/2018 CLINICAL DATA:  Endotracheal intubation EXAM: PORTABLE CHEST 1 VIEW COMPARISON:  None. FINDINGS: Endotracheal tube tip is at the level of the clavicular heads. There is moderate cardiomegaly  without pulmonary edema. No pneumothorax. No focal airspace consolidation. IMPRESSION: 1. Endotracheal tube tip at the level of the clavicular heads. 2. Cardiomegaly. Electronically Signed   By: Ulyses Jarred M.D.   On: 07/06/2018 02:24   Dg Abd Portable 1v  Result Date: 07/06/2018 CLINICAL DATA:  Orogastric tube placement EXAM: PORTABLE ABDOMEN - 1 VIEW COMPARISON:  None. FINDINGS: OG tube tip and side port project over the midline upper abdomen. The tip is in the expected  location of the gastroduodenal junction. IMPRESSION: OG tube tip in the expected location of the gastroduodenal junction. Electronically Signed   By: Ulyses Jarred M.D.   On: 07/06/2018 02:23   Ir Percutaneous Art Thrombectomy/infusion Intracranial Inc Diag Angio  Result Date: 07/07/2018 INDICATION: Global aphasia. Right-sided hemiplegia. Occluded inferior division of the left middle cerebral artery M2 M3 region EXAM: 1. EMERGENT LARGE VESSEL OCCLUSION THROMBOLYSIS (POSTERIOR CIRCULATION) MEDICATIONS: No antibiotic was administered within 1 hour of the procedure. ANESTHESIA/SEDATION: General anesthesia CONTRAST:  Isovue 300 approximately 110 mL. FLUOROSCOPY TIME:  Fluoroscopy Time: 73 minutes 24 seconds (3818 mGy). COMPLICATIONS: None immediate. TECHNIQUE: Following a full explanation of the procedure along with the potential associated complications, an informed witnessed consent was obtained from the patient's husband. The risks of intracranial hemorrhage of 10%, worsening neurological deficit, ventilator dependency, death and inability to revascularize were all reviewed in detail with the patient's husband. The patient was then put under general anesthesia by the Department of Anesthesiology at [facility]. The right groin was prepped and draped in the usual sterile fashion. Thereafter using modified Seldinger technique, transfemoral access into the right common femoral artery was obtained without difficulty. Over a 0.035  inch guidewire a 5 French Pinnacle sheath was inserted. Through this, and also over a 0.035 inch guidewire a 5 [place] JB 1 catheter was advanced to the aortic arch region and selectively positioned in the left common carotid artery. FINDINGS: The left common carotid arteriogram demonstrates the left external carotid artery and its major branches to be widely patent. The left internal carotid artery at the bulb demonstrates approximately 60% stenosis by the NASCET criteria secondary to a smooth posterior wall atherosclerotic plaque. Distal to this the left internal carotid artery is seen to opacify to the cranial skull base. The petrous, cavernous and supraclinoid segments are widely patent. A left posterior communicating artery is seen opacifying the left posterior cerebral artery distribution. 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 The inferior division from the superior duplicated branch demonstrates complete angiographic occlusion. The left anterior cerebral artery opacifies into the capillary and venous phases. The lateral projection of the whole head run demonstrates a large area of hypoperfusion involving the left parietal and posterior frontal regions. PROCEDURE: ENDOVASCULAR REVASCULARIZATION OF OCCLUDED INFERIOR DIVISION OF THE UPPER BRANCH OF THE LEFT MIDDLE CEREBRAL ARTERY, WITH 2 PASSES WITH THE TREVO PROVUE 3 MM X 20 MM RETRIEVAL DEVICE, AND 1 PASS WITH THE 5 MM X 33 MM EMBOTRAP RETRIEVAL DEVICE ACHIEVING A TICI 3 REVASCULARIZATION. The diagnostic JB 1 catheter in the left common carotid artery was exchanged over a 0.035 inch 300 cm Rosen exchange guidewire for an 8 French 55 cm Brite tip neurovascular sheath using biplane roadmap technique and constant fluoroscopic guidance. Good aspiration obtained from the hub of the neurovascular sheath. This was then connected to continuous heparinized saline infusion. Over the [employer] guidewire, an 8 [place]  85 cm FlowGate balloon guide catheter which had been prepped with 50% contrast and 50% heparinized saline infusion was positioned just proximal to the left common carotid bifurcation. The guidewire was removed. Good aspiration obtained from the hub of the [facility] guide catheter. Gentle contrast injection demonstrated no evidence of spasms, dissections or of intraluminal filling defects. A combination of a Catalyst 5 French 132 cm guide catheter inside of which was an 021 Trevo ProVue microcatheter was advanced over a 0.014 inch Softip Synchro micro guidewire to the distal end of the [employer] guide  catheter. With the micro guidewire leading with a J-tip configuration to avoid dissections or inducing spasm, the combination was navigated without difficulty to the supraclinoid left ICA. The superior division was then selected with the micro guidewire followed by the microcatheter. Access through the occluded inferior division was then obtained with the micro guidewire to the M2 M3 regions followed by the microcatheter. The guidewire was removed. Good aspiration obtained from the hub of the microcatheter. A gentle contrast injection demonstrated slow distal antegrade flow. The microcatheter was then connected to continuous heparinized saline infusion. A 3 mm x 20 mm Trevo ProVue retrieval device was advanced to the distal end of microcatheter. The proximal and the distal landing zones were then defined. The O ring on the delivery microcatheter was loosened. With slight forward gentle traction with the right hand on the delivery micro guidewire, with the left hand the delivery microcatheter was retrieved unsheathing the retrieval device. A control arteriogram performed through the Catalyst guide catheter in the supraclinoid left ICA demonstrated partial revascularization of the inferior division of the left middle cerebral artery. With proximal flow arrest in the left common carotid artery at the site of bifurcation, the  combination of the retrieval device, the microcatheter, and the Catalyst guide catheter was retrieved as constant aspiration was applied with a 60 mL syringe at the hub of the [facility] guide catheter, and a Penumbra vacuum suction device at the hub of the Catalyst guide catheter. The combinations were retrieved and removed. No evidence of clot was seen in the retrieval device, or in the aspirate. Aspiration was continued as the proximal flow arrest was reversed. A control arteriogram performed through the [facility] guide catheter in the left common carotid artery demonstrated no change in the occluded inferior division of the superior branch of the duplicated left MCA. A second attempt was then made again using the above combination. Again access into the distal M2 M3 region of the occluded vessel was obtained with the micro guidewire followed by the microcatheter. After having verified safe position of tip of the microcatheter, the 3 mm x 20 mm Trevo ProVue device was again deployed as above. With proximal flow arrest in the left common carotid artery by inflating the balloon of the [facility] guide catheter, the combination of the retrieval device, microcatheter and the Catalyst guide catheter were retrieved and removed. Again aspiration was applied with a 60 mL syringe at the hub of the [facility] guide catheter, and the Penumbra suction device at the hub of the Catalyst guide catheter. Aspiration was continued as flow arrest was reversed. A control arteriogram performed again through the [facility] guide catheter in the left common carotid artery continued to demonstrate no real opacification of the inferior division of the superior branch of the duplicated left middle cerebral artery. A controlled arteriogram performed was then made using the combination of the Trevo ProVue microcatheter inside of a 5 French 132 cm Catalyst guide catheter which was advanced to the left middle cerebral artery over a 0.014 inch Softip  Synchro micro guidewire. Again access into the occluded inferior division was obtained with the micro guidewire followed by the microcatheter which was positioned in the M3 region of the left middle cerebral artery. The guidewire was removed. Again after having verified safe position of the tip of the microcatheter, a 5 mm x 33 mm Embotrap retrieval device was advanced to the distal end of the microcatheter. The proximal and the distal landing zones were defined. With proximal flow arrest in the  left internal carotid artery, the combination of the retrieval device, the microcatheter, and the 5 French 132 cm Catalyst guide catheter were retrieved and removed as constant aspiration was applied with a 60 mL syringe at the hub of the [facility] guide catheter, and the Penumbra suction device at the hub of the Catalyst guide catheter. The combination was retrieved and removed. A clot was noted entangled and the cells of the retrieval device. The aspirate was continued as flow arrest was reversed by deflating the balloon in the left common carotid artery. An arteriogram performed with free back bleed of blood at the hub of the [facility] guide catheter demonstrated now complete revascularization of the previously occluded inferior division of the superior branch of the left middle cerebral artery. A control arteriogram performed centered over the whole head now demonstrated reperfusion of the previously noted hypoperfused parietal and the posterior frontal regions. A TICI 3 revascularization had been achieved of the left middle cerebral distribution. A control arteriogram performed through the [facility] guide catheter in the left common carotid artery centered over the carotid bifurcation continued to demonstrate 60% stenosis of the proximal left internal carotid artery at the bulb. No evidence of dissections or of intraluminal filling defects were noted extra cranially. The 8 [place] FlowGate guide catheter, and the 8 [place]  neurovascular sheath were then removed over a J-tip guidewire for an 8 [place] Pinnacle sheath. This was then removed with the successful application of a 7 [place] Exo-Seal closure device achieving hemostasis. Distal pulses in both feet were Dopplerable in the dorsalis pedis, and the posterior tibial regions bilaterally unchanged from prior to the beginning of the procedure. The right groin appeared soft without evidence of hematoma or bleeding. A Dyna CT of the brain performed demonstrated a focal area of hyperdensity in the subcortical insular area lateral to the putamen and a small one just cranial and lateral to this. No known gross hemorrhages, or mass effect or midline shift was noted. The patient was left intubated on account of her difficulty with comprehension prior to the intubation. The patient was then transferred to the neuro ICU to continue with post thrombectomy management. IMPRESSION: Status post endovascular complete revascularization of occluded inferior division of the superior duplicated left middle cerebral artery with 2 passes with the 3 mm x 20 mm Trevo ProVue retrieval device, and 1 pass with the Embotrap 5 mm x 33 mm retrieval device achieving a revascularization. PLAN: Follow-up in the clinic in 4 to 6 weeks post discharge. Electronically Signed   By: Luanne Bras M.D.   On: 07/06/2018 14:56   Carotid (at [place] Only)  Result Date: 07/07/2018 Carotid Arterial Duplex Study Indications: CVA. Performing Technologist: Oliver Hum RVT  Examination Guidelines: A complete evaluation includes B-mode imaging, spectral Doppler, color Doppler, and power Doppler as needed of all accessible portions of each vessel. Bilateral testing is considered an integral part of a complete examination. Limited examinations for reoccurring indications may be performed as noted.  Right Carotid Findings: +----------+--------+-------+--------+--------------------------------+--------+           PSV  cm/sEDV    StenosisDescribe                        Comments                   cm/s                                                    +----------+--------+-------+--------+--------------------------------+--------+  CCA Prox  65      17             smooth and heterogenous                  +----------+--------+-------+--------+--------------------------------+--------+ CCA Distal53      13             smooth, heterogenous and                                                  calcific                                 +----------+--------+-------+--------+--------------------------------+--------+ ICA Prox  56      16             smooth, heterogenous and                                                  calcific                                 +----------+--------+-------+--------+--------------------------------+--------+ ICA Distal73      22                                             tortuous +----------+--------+-------+--------+--------------------------------+--------+ ECA       331     37             calcific                                 +----------+--------+-------+--------+--------------------------------+--------+ +----------+--------+-------+--------+-------------------+           PSV cm/sEDV cmsDescribeArm Pressure (mmHG) +----------+--------+-------+--------+-------------------+ [id number]                                        +----------+--------+-------+--------+-------------------+ +---------+--------+--+--------+--+---------+ VertebralPSV cm/s60EDV cm/s20Antegrade +---------+--------+--+--------+--+---------+  Left Carotid Findings: +----------+--------+-------+--------+--------------------------------+--------+           PSV cm/sEDV    StenosisDescribe                        Comments                   cm/s                                                     +----------+--------+-------+--------+--------------------------------+--------+ CCA Prox  70      20             smooth and heterogenous                  +----------+--------+-------+--------+--------------------------------+--------+ CCA Distal68  14             smooth, heterogenous and                                                  calcific                                 +----------+--------+-------+--------+--------------------------------+--------+ ICA Prox  166     44             smooth, heterogenous and                                                  calcific                                 +----------+--------+-------+--------+--------------------------------+--------+ ICA Mid   102     23                                             tortuous +----------+--------+-------+--------+--------------------------------+--------+ ICA Distal56      18                                             tortuous +----------+--------+-------+--------+--------------------------------+--------+ ECA       57      11             calcific                                 +----------+--------+-------+--------+--------------------------------+--------+ +----------+--------+--------+--------+-------------------+ SubclavianPSV cm/sEDV cm/sDescribeArm Pressure (mmHG) +----------+--------+--------+--------+-------------------+           102                                         +----------+--------+--------+--------+-------------------+ +---------+--------+--+--------+--+---------+ VertebralPSV cm/s54EDV cm/s15Antegrade +---------+--------+--+--------+--+---------+  Summary: Right Carotid: Velocities in the right ICA are consistent with a 1-39% stenosis. Left Carotid: Velocities in the left ICA are consistent with a 40-59% stenosis. Vertebrals: Bilateral vertebral arteries demonstrate antegrade flow. *See table(s) above for measurements and observations.   Electronically signed by Antony Contras MD on 07/07/2018 at 8:02:21 AM.    Final     Labs:  CBC: Recent Labs    05/12/18 1212 07/06/18 0703 07/07/18 1140 07/08/18 0508  WBC 12.1* 9.9 12.6* 9.7  HGB 13.7 11.3* 10.8* 10.6*  HCT 40.8 34.8* 34.4* 32.8*  PLT 414 297 284 275    COAGS: No results for input(s): INR, APTT in the last 8760 hours.  BMP: Recent Labs    05/12/18 1212 07/06/18 0703 07/07/18 1140 07/08/18 0508  NA 141 140 142 143  K 5.5* 3.8 4.2 3.5  CL 100 111 114* 111  CO2 25 24 20* 24  GLUCOSE 87  101* 103* 90  BUN 25 9 9 10   CALCIUM 10.5* 8.7* 9.0 9.3  CREATININE 1.20* 0.89 0.92 0.94  GFRNONAA 44* >60 59* 57*  GFRAA 51* >60 >60 >60    LIVER FUNCTION TESTS: Recent Labs    05/12/18 1212  BILITOT 0.7  AST 25  ALT 19  ALKPHOS 119*  PROT 6.6  ALBUMIN 4.3    Assessment and Plan:  Left MCA superior division distal M2/M3 junction occlusion s/p emergent mechanical thrombectomy achieving a TICI 3 revascularization 07/06/2018 by Dr. Estanislado Pandy. Patient's condition improving- can now move all extremities but with expressive aphasia and right facial droop. Right groin incision stable. Plan to follow-up with Dr. Estanislado Pandy in clinic 4-6 weeks after discharge. Appreciate and agree with neurology management. IR to follow.   Electronically Signed: Earley Abide, PA-C 07/08/2018, 10:03 AM   I spent a total of 15 Minutes at the the patient's bedside AND on the patient's hospital floor or unit, greater than 50% of which was counseling/coordinating care for left MCA superior division distal M2/M3 junction occlusion s/p revascularization.

## 2018-07-08 NOTE — Progress Notes (Addendum)
   NAME:  Kaitlyn Underwood, MRN:  [id number], DOB:  [birth year], LOS: 3 ADMISSION DATE:  07/05/2018, CONSULTATION DATE:  07/06/2018 REFERRING MD:  Estanislado Pandy - Neuroradiology, CHIEF COMPLAINT:  CVA   Brief History   77 year old woman with aphasia and right sided weakness. Received TPA and underwent M2 thrombectomy.  Interim history/subjective:  No distress  Objective   Blood pressure (Abnormal) 158/75, pulse 66, temperature 97.9 F (36.6 C), temperature source Oral, resp. rate 19, height 5\' 2"  (1.575 m), weight 90.7 kg, SpO2 94 %.        Intake/Output Summary (Last 24 hours) at 07/08/2018 0845 Last data filed at 07/08/2018 0700 Gross per 24 hour  Intake 1019.28 ml  Output 2950 ml  Net -1930.72 ml   Filed Weights   07/06/18 0208  Weight: 90.7 kg    Examination:  General: This is a 77 year old white female she is currently resting in bed she is in no acute distress she does exhibit expressive aphasia, getting frustrated from time to time with communication HEENT: Right-sided facial droop, otherwise normocephalic mucous membranes are moist no jugular venous distention Pulmonary: Clear to auscultation without accessory use she is currently on room air Cardiac: Regular irregular, atrial fibrillation noted on telemetry.  She does have systolic murmur  Extremities: Scattered areas of ecchymosis, dependent upper extremity edema, warm extremities, brisk capillary refill, strong pulses Abdomen: Soft, nontender, no organomegaly, positive bowel sounds GU: Clear yellow Neuro: Awake, briskly follows commands, moves all extremities, does exhibit expressive aphasia, can say yes or no only  Resolved problem list   Respiratory failure d/t ineffective airway protection (extubated 11/19)  Assessment & Plan:   Status post stroke revascularization with residual expressive aphasia Plan Asa 81 mg daily BP goal ~ 140 Initiating rehab efforts Swallow eval later today SLP also planning on linguistics  eval  Possible depression Plan Add low-dose SSRI  Known atrill fibrillation with high CHADS score.  Plan Resume DOAC 11/21 given size of stroke Cont dig and lopressor   COPD Plan Cont duo-neb  Dyslipidemia with low LDL and low normal HDL Plan Cont crestor   DM  Plan ssi    Best practice:  Diet: NPO; swallow evaluation pending 11/20 Pain/Anxiety/Delirium protocol (if indicated): dc'd 11/19 s/p extubation VAP protocol (if indicated): bundle d/cd 11/19 DVT prophylaxis: heparin Lequire GI prophylaxis: protonix  Glucose control:  Phase 1 Mobility: Bedrest->OOB Code Status: Full Family Communication: No update Disposition: no longer critically ill. We will sign off. Focus to be on rehab efforts.   Critical care time: na

## 2018-07-08 NOTE — Evaluation (Signed)
Speech Language Pathology Evaluation Patient Details Name: Kaitlyn Underwood MRN: [id number] DOB: 10-03-1940 Today's Date: 07/08/2018 Time: 1751-0258 SLP Time Calculation (min) (ACUTE ONLY): 14 min  Problem List:  Patient Active Problem List   Diagnosis Date Noted  . Acute ischemic left MCA stroke ([place]) 07/06/2018  . Prediabetes 07/06/2018  . Chronic systolic heart failure ([place]) 07/06/2018  . Endotracheally intubated 07/06/2018  . Hyperkalemia 07/06/2018  . Hypercalcemia 07/06/2018  . CKD (chronic kidney disease), stage III ([place]) 07/06/2018  . COPD (chronic obstructive pulmonary disease) ([place]) 07/06/2018  . Middle cerebral artery embolism, left 07/06/2018  . Bilateral carotid bruits 04/03/2017  . Deformity of toe of left foot 12/16/2016  . Acute idiopathic gout involving toe of left foot 11/18/2016  . Contracture of right ankle 03/25/2016  . Foot pain, bilateral 03/25/2016  . Paresthesia of left foot 03/25/2016  . Plantar fasciitis of right foot 03/25/2016  . Chronic atrial fibrillation 05/16/2015  . Coronary artery disease involving native coronary artery of native heart without angina pectoris 05/16/2015  . Diabetes mellitus due to underlying condition with unspecified complications ([place]) 52/77/8242  . Dyslipidemia 05/16/2015  . Essential hypertension 05/16/2015  . Carotid stenosis, left 05/16/2015  . Obesities, morbid ([place]) 05/16/2015   Past Medical History:  Past Medical History:  Diagnosis Date  . Anxiety   . Atrial fibrillation ([place])   . Chronic systolic heart failure ([place])   . COPD (chronic obstructive pulmonary disease) ([place])   . Coronary artery disease   . Hypertension   . Hypothyroidism   . Left renal mass    unconfirmed documentation of "kidney cancer", possibly cyst  . Pre-diabetes    Past Surgical History:  Past Surgical History:  Procedure Laterality Date  . IR CT HEAD LTD  07/06/2018  . IR PERCUTANEOUS ART THROMBECTOMY/INFUSION INTRACRANIAL INC DIAG ANGIO   07/06/2018  . LEFT HEART CATH AND CORONARY ANGIOGRAPHY N/A 04/29/2017   Procedure: LEFT HEART CATH AND CORONARY ANGIOGRAPHY;  Surgeon: Belva Crome, MD;  Location: [facility];  Service: Cardiovascular;  Laterality: N/A;  . RADIOLOGY WITH ANESTHESIA N/A 07/05/2018   Procedure: RADIOLOGY WITH ANESTHESIA;  Surgeon: Luanne Bras, MD;  Location: [facility];  Service: Radiology;  Laterality: N/A;  . ULTRASOUND GUIDANCE FOR VASCULAR ACCESS  04/29/2017   Procedure: Ultrasound Guidance For Vascular Access;  Surgeon: Belva Crome, MD;  Location: [facility];  Service: Cardiovascular;;   HPI:  Kaitlyn Underwood is a 77 y.o. female with a history of HTN, hyperlipidemia, COPD, afib who presented with R weakness and aphasia. CTA showed occluded distal LT MCA - M2, had TPA and emergent thrombectomy at [facility]. ETT 11/17-11/19. Per MD note, pt has mild wheezing and secretions. CXR moderate enlargement of the cardiopericardial silhouette, without edema and degenerative glenohumeral arthropathy bilaterally. 11/18 MRI small to moderate evolving acute L MCA territory infarct, associated petechial hemorrhage without frank hemorrhagic transformation.    Assessment / Plan / Recommendation Clinical Impression  Pt presented with severe expressive aphasia, mild receptive aphasia, and suspected oral apraxia following L MCA stroke. She was unable to answer basic open ended questions, say her name, verbalize names of familiar objects, or repeat any words or vowel sounds given max verbal/visual cues from the clinician. Spontaneous expression marked by hesitations/false starts, mostly neologisms with occassional accurate single words and /k/ phoneme. Obvious signs of pt's frustration were observed; sighing, shaking head, tearful at one point. Pt answered yes/no questions by nonverbal means (nodding her head yes or  shaking her head no) with 80% accuracy and expressed comprehension of humor - laughed when SLP asked "are you a  doctor?" Although pt followed basic 1-step commands with 100% accuracy, she demonstrated 25% accuracy in following 2-step sequential commands. Recommend pt recieve speech-language intervention to enhance verbal expression as well as functional comprehension tasks. ST will continue to follow acutely    SLP Assessment  SLP Recommendation/Assessment: Patient needs continued Speech Lanaguage Pathology Services SLP Visit Diagnosis: Apraxia (R48.2);Aphasia (R47.01)    Follow Up Recommendations  Skilled Nursing facility    Frequency and Duration min 2x/week  2 weeks      SLP Evaluation Cognition  Overall Cognitive Status: Impaired/Different from baseline Arousal/Alertness: Awake/alert Orientation Level: Oriented to person;Oriented to place Attention: Focused;Sustained Focused Attention: Appears intact Sustained Attention: Appears intact Memory: (further assess) Awareness: (further assess) Problem Solving: (further assess) Executive Function: Initiating Initiating: Impaired(able to initiate suctioning) Initiating Impairment: Verbal basic Behaviors: Lability Safety/Judgment: Impaired       Comprehension  Auditory Comprehension Overall Auditory Comprehension: Impaired Yes/No Questions: Impaired Basic Biographical Questions: 76-100% accurate(100%) Basic Immediate Environment Questions: 75-100% accurate Commands: Impaired One Step Basic Commands: 75-100% accurate Two Step Basic Commands: 25-49% accurate Interfering Components: Processing speed;Working memory EffectiveTechniques: Repetition;Extra processing time Visual Recognition/Discrimination Discrimination: Not tested Reading Comprehension Reading Status: Not tested    Expression Expression Primary Mode of Expression: Verbal Verbal Expression Overall Verbal Expression: Impaired Initiation: Impaired Level of Generative/Spontaneous Verbalization: Word(most often 1 syllable) Repetition: Impaired Level of Impairment: Word  level;Phrase level;Sentence level Naming: Impairment Responsive: 0-25% accurate Confrontation: Impaired Convergent: Not tested Divergent: Not tested Verbal Errors: Aware of errors;Neologisms(occassional accurate single word) Pragmatics: No impairment Non-Verbal Means of Communication: Gestures Written Expression Dominant Hand: Right Written Expression: Exceptions to [facility] Self Formulation Ability: Letter   Oral / Motor  Oral Motor/Sensory Function Overall Oral Motor/Sensory Function: Within functional limits(for tasks assessed) Motor Speech Overall Motor Speech: Impaired Respiration: Within functional limits Phonation: Normal Resonance: Within functional limits Articulation: Within functional limitis Intelligibility: Intelligibility reduced Motor Planning: Impaired Level of Impairment: Word Motor Speech Errors: Aware;Groping for words   Jettie Booze, Student SLP                    Jettie Booze 07/08/2018, 11:59 AM

## 2018-07-08 NOTE — Progress Notes (Signed)
Modified Barium Swallow Progress Note  Patient Details  Name: Kaitlyn Underwood MRN: [id number] Date of Birth: 11-19-1940  Today's Date: 07/08/2018  Modified Barium Swallow completed.  Full report located under Chart Review in the Imaging Section.  Brief recommendations include the following:  Clinical Impression  Pt presents with mod-servere oropharyngeal dysphagia marked by oral holding, reduced posterior propulsion, lingual and valleculuar residue, as well as penetration and aspiration of nectar and honey thick liquids. Pt's sensation of penetrates/aspirates was significantly delayed, with a reflexive cough only triggering after multiple instances of aspiration. Lingual and vallecular residue from nectar and honey were consistently aspirated after the swallow and delayed arytenoid excursion to epiglottic petiole preventing complete laryngeal vestibule closure resulted in penetration of nectar and honey during the swallow. Pt's airway protection remained in tact during trials of puree, however reduced posterior propulsion of boluses and subsequent oral residue require pt to perform additional dry swallow for complete oral transit and clearance without pharyngeal residue. Recommend pt initiate puree diet, pudding thick liquids, meds crushed in puree with FULL supervision and assistance with feeding to ensure small bites/sips and verbal reminder for second swallow after each bite/sip. ST will continue to follow to provide treatment with diet safety, efficiency, compensatory strategy training, and potential for diet/liquid advancement.   Swallow Evaluation Recommendations       SLP Diet Recommendations: Dysphagia 1 (Puree) solids;Pudding thick liquid   Liquid Administration via: Spoon   Medication Administration: Crushed with puree   Supervision: Full assist for feeding;Full supervision/cueing for compensatory strategies   Compensations: Slow rate;Small sips/bites;Clear throat  intermittently;Multiple dry swallows after each bite/sip;Minimize environmental distractions   Postural Changes: Seated upright at 90 degrees   Oral Care Recommendations: Oral care BID   Other Recommendations: Order thickener from pharmacy;Remove water pitcher;Have oral suction available    Houston Siren 07/08/2018,3:50 PM   Orbie Pyo [place].Ed Risk analyst [phone] Office [phone]

## 2018-07-08 NOTE — Evaluation (Signed)
Physical Therapy Evaluation Patient Details Name: Kaitlyn Underwood MRN: [id number] DOB: [birth year] Today's Date: 07/08/2018   History of Present Illness  77 y.o. female admitted on 07/05/18 for R sided weakness and aphasia.  Pt treated for possible stroke after CT was negative for hemorrhage with IV tPA and underwent Lt common carotid arteriogram floowed by complete revascularization of of occluded Lt MCA.  MRI revealed L MCA infarct and associated petechial hemmorrhage.  Pt with significant PMH of pre-diabetes, HRN, CAD, COPD, chronic systolic heart failure, and A-fib.                                                                                        Clinical Impression  Pt able to mobilize OOB to chair with two person heavy assist. She has significant cognitive and language deficits.  She would benefit from post acute rehab. When family is present we will discuss what their goals of therapy and post acute preference will be.   PT to follow acutely for deficits listed below.        Follow Up Recommendations CIR    Equipment Recommendations  Wheelchair (measurements PT);Wheelchair cushion (measurements PT);3in1 (PT)    Recommendations for Other Services Rehab consult     Precautions / Restrictions Precautions Precautions: Fall Precaution Comments: right sided weakness left gaze preference,       Mobility  Bed Mobility Overal bed mobility: Needs Assistance Bed Mobility: Supine to Sit     Supine to sit: Mod assist;HOB elevated     General bed mobility comments: Mod assist to help bring right leg to EOB, support trunk during transition to sit from elevated HOB and weight shift to scoot until feet were touching the ground.    Transfers Overall transfer level: Needs assistance Equipment used: 2 person hand held assist Transfers: Sit to/from [facility] Sit to Stand: +2 physical assistance;Mod assist Stand pivot transfers: +2 physical assistance;Mod assist       General transfer comment: Two person mod assist to transfer from bed to [facility], and then from [facility] to recliner chair all going toward's pt's left, stronger side.  Pt very flexed in her trunk and only minimally able to correct with verbal and manual cues to stand up taller.  Mod assist mostly to support trunk as pt took steps around over sluggish right leg.    Ambulation/Gait             General Gait Details: unable to safely at this time.      Modified Rankin (Stroke Patients Only) Modified Rankin (Stroke Patients Only) Pre-Morbid Rankin Score: No symptoms Modified Rankin: Moderately severe disability     Balance Overall balance assessment: Needs assistance Sitting-balance support: Feet supported;Single extremity supported Sitting balance-Leahy Scale: Poor Sitting balance - Comments: min to min guard assist EOB for safety more than true trunk support to ensure she did not stand before therapists were ready for her.   Standing balance support: Bilateral upper extremity supported Standing balance-Leahy Scale: Poor Standing balance comment: two person mod assist in standing.  Pertinent Vitals/Pain Pain Assessment: Faces Faces Pain Scale: Hurts a little bit Pain Location: head, difficult to localize Pain Descriptors / Indicators: Aching Pain Intervention(s): Limited activity within patient's tolerance;Monitored during session;Repositioned    Home Living Family/patient expects to be discharged to:: Private residence                 Additional Comments: Pt unable to report due to severe expressive aphasia.      Prior Function           Comments: assumed independent, chart did not reveal any prior therapy, no family present to report.         Extremity/Trunk Assessment   Upper Extremity Assessment Upper Extremity Assessment: Defer to OT evaluation    Lower Extremity Assessment Lower Extremity Assessment: RLE  deficits/detail RLE Deficits / Details: right leg with functional weakness as observed when moving to EOB (unable to lift R leg significantly against gravity to progress it to EOB without assistance, some instability in standing and stepping around having difficulty progressing it.      Cervical / Trunk Assessment Cervical / Trunk Assessment: Other exceptions Cervical / Trunk Exceptions: In standing, pt is very flexed, not sure if this is new or baseline.   Communication   Communication: Expressive difficulties;Other (comment)(per SLP note some mild receptive difficulties as well)  Cognition Arousal/Alertness: Awake/alert Behavior During Therapy: WFL for tasks assessed/performed Overall Cognitive Status: Impaired/Different from baseline Area of Impairment: Attention;Memory;Following commands;Safety/judgement;Awareness;Problem solving                   Current Attention Level: Sustained Memory: Decreased recall of precautions Following Commands: Follows one step commands with increased time Safety/Judgement: Decreased awareness of safety;Decreased awareness of deficits Awareness: Intellectual Problem Solving: Decreased initiation;Difficulty sequencing;Requires verbal cues;Requires tactile cues General Comments: Pt with left gaze preference, difficulty sequencing tasks.  Seems to respond to yes/no questions fairly consistently (75% of the time).        General Comments General comments (skin integrity, edema, etc.): VSS throughout session, BPs within stated parameters by MD.        Assessment/Plan    PT Assessment Patient needs continued PT services  PT Problem List Decreased strength;Decreased activity tolerance;Decreased balance;Decreased mobility;Decreased coordination;Decreased cognition;Decreased knowledge of use of DME;Decreased safety awareness;Decreased knowledge of precautions;Obesity       PT Treatment Interventions DME instruction;Gait training;Stair  training;Functional mobility training;Therapeutic activities;Therapeutic exercise;Balance training;Neuromuscular re-education;Patient/family education;Cognitive remediation    PT Goals (Current goals can be found in the Care Plan section)  Acute Rehab PT Goals Patient Stated Goal: unable to state due to expressive difficulties PT Goal Formulation: Patient unable to participate in goal setting Time For Goal Achievement: 07/22/18 Potential to Achieve Goals: Good    Frequency Min 4X/week        Co-evaluation PT/OT/SLP Co-Evaluation/Treatment: Yes Reason for Co-Treatment: Complexity of the patient's impairments (multi-system involvement);Necessary to address cognition/behavior during functional activity;For patient/therapist safety;To address functional/ADL transfers PT goals addressed during session: Mobility/safety with mobility;Balance         AM-PAC PT "6 Clicks" Daily Activity  Outcome Measure Difficulty turning over in bed (including adjusting bedclothes, sheets and blankets)?: Unable Difficulty moving from lying on back to sitting on the side of the bed? : Unable Difficulty sitting down on and standing up from a chair with arms (e.g., wheelchair, bedside commode, etc,.)?: Unable Help needed moving to and from a bed to chair (including a wheelchair)?: A Lot Help needed walking in hospital room?: Total Help needed climbing  3-5 steps with a railing? : Total 6 Click Score: 7    End of Session Equipment Utilized During Treatment: Gait belt Activity Tolerance: Patient tolerated treatment well Patient left: in chair;with call bell/phone within reach;with chair alarm set Nurse Communication: Mobility status PT Visit Diagnosis: Muscle weakness (generalized) (M62.81);Difficulty in walking, not elsewhere classified (R26.2);Hemiplegia and hemiparesis Hemiplegia - Right/Left: Right Hemiplegia - dominant/non-dominant: Dominant Hemiplegia - caused by: Cerebral infarction    Time:  8833-7445 PT Time Calculation (min) (ACUTE ONLY): 28 min   Charges:         Wells Guiles B. Jenya Putz, PT, DPT  Acute Rehabilitation #[phone] pager #[phone] office   PT Evaluation $PT Eval Moderate Complexity: 1 Mod         07/08/2018, 4:55 PM

## 2018-07-08 NOTE — Progress Notes (Addendum)
STROKE TEAM PROGRESS NOTE   SUBJECTIVE (INTERVAL HISTORY) Her RN is at the bedside. Pt sitting in bed, comfortably. No SOB or distress. Overnight had spiking fever at 101.2, but this am was afebrile. BP and HR under control. Will check CXR and UA. Pending MBS for swallow eval.    OBJECTIVE Temp:  [97.9 F (36.6 C)-101.2 F (38.4 C)] 97.9 F (36.6 C) (11/20 0800) Pulse Rate:  [62-91] 91 (11/20 0800) Cardiac Rhythm: Atrial fibrillation (11/20 0800) Resp:  [16-26] 26 (11/20 0800) BP: (115-168)/(48-87) 156/69 (11/20 0800) SpO2:  [93 %-100 %] 95 % (11/20 0800)  Recent Labs  Lab 07/07/18 1531 07/07/18 1927 07/07/18 2317 07/08/18 0321 07/08/18 0827  GLUCAP 93 91 89 80 74   Recent Labs  Lab 07/06/18 0703 07/07/18 1140 07/08/18 0508  NA 140 142 143  K 3.8 4.2 3.5  CL 111 114* 111  CO2 24 20* 24  GLUCOSE 101* 103* 90  BUN 9 9 10   CREATININE 0.89 0.92 0.94  CALCIUM 8.7* 9.0 9.3  MG 1.8  --   --   PHOS 3.7  --   --    No results for input(s): AST, ALT, ALKPHOS, BILITOT, PROT, ALBUMIN in the last 168 hours. Recent Labs  Lab 07/06/18 0703 07/07/18 1140 07/08/18 0508  WBC 9.9 12.6* 9.7  NEUTROABS 7.0  --   --   HGB 11.3* 10.8* 10.6*  HCT 34.8* 34.4* 32.8*  MCV 94.6 97.5 96.2  PLT 297 284 275   No results for input(s): CKTOTAL, CKMB, CKMBINDEX, TROPONINI in the last 168 hours. No results for input(s): LABPROT, INR in the last 72 hours. No results for input(s): COLORURINE, LABSPEC, [place], GLUCOSEU, HGBUR, BILIRUBINUR, KETONESUR, PROTEINUR, UROBILINOGEN, NITRITE, LEUKOCYTESUR in the last 72 hours.  Invalid input(s): APPERANCEUR     Component Value Date/Time   CHOL 146 07/06/2018 0704   CHOL 189 05/12/2018 1212   TRIG 124 07/06/2018 0704   HDL 49 07/06/2018 0704   HDL 63 05/12/2018 1212   CHOLHDL 3.0 07/06/2018 0704   VLDL 25 07/06/2018 0704   LDLCALC 72 07/06/2018 0704   LDLCALC 103 (H) 05/12/2018 1212   Lab Results  Component Value Date   HGBA1C 5.6  07/06/2018   No results found for: LABOPIA, COCAINSCRNUR, LABBENZ, AMPHETMU, THCU, LABBARB  No results for input(s): ETH in the last 168 hours.  I have personally reviewed the radiological images below and agree with the radiology interpretations.  [name] DJ Contrast  Result Date: 07/06/2018 CLINICAL DATA:  Follow-up examination for acute stroke. 77 year old female who presented with aphasia and right-sided weakness, status post tPA and catheter directed thrombectomy. EXAM: MRI HEAD WITHOUT CONTRAST MRA HEAD WITHOUT CONTRAST TECHNIQUE: Multiplanar, multiecho pulse sequences of the brain and surrounding structures were obtained without intravenous contrast. Angiographic images of the head were obtained using MRA technique without contrast. COMPARISON:  Prior CT and arteriogram from 07/05/2018. FINDINGS: MRI HEAD FINDINGS Brain: Generalized age-related cerebral atrophy. Patchy and confluent T2/FLAIR hyperintensity within the periventricular and deep white matter both cerebral hemispheres, most consistent with chronic small vessel ischemic disease, moderate in nature. Patchy small moderate volume restricted diffusion seen involving the left insula and overlying left frontal operculum, extending to involve the left periventricular white matter, consistent with acute left MCA territory infarct (series 3, image 26). Associated mild localized gyral swelling and edema without significant mass effect. Patchy susceptibility artifact within the area of infarction consistent with associated petechial hemorrhage (series 6, image 54). No frank hemorrhagic transformation  by MRI. No other evidence for acute or subacute ischemia. Gray-white matter differentiation otherwise maintained. Few small chronic micro hemorrhages noted involving the temporal lobes bilaterally. Probable 12 mm densely calcified meningioma overlies the anterior left frontal convexity. No associated edema or mass effect. No other mass lesion. No  midline shift or mass effect. No hydrocephalus. Pituitary gland within normal limits. Vascular: Major intracranial vascular flow voids maintained. Skull and upper cervical spine: Craniocervical junction within normal limits. Bone marrow signal intensity normal. Mild soft tissue swelling noted at the left frontoparietal scalp near the vertex (series 11, image 12). Sinuses/Orbits: Globes and orbital soft tissues within normal limits. Paranasal sinuses are clear. Small left mastoid effusion, of doubtful significance. Inner ear structures grossly normal. Other: None. MRA HEAD FINDINGS ANTERIOR CIRCULATION: Distal cervical segments of the internal carotid arteries are widely patent with antegrade flow. Petrous, cavernous, and supraclinoid segments widely patent without stenosis. Origin of the ophthalmic arteries patent bilaterally. ICA termini well perfused and symmetric. A1 segments patent bilaterally. Normal anterior communicating artery. Anterior cerebral arteries widely patent to their distal aspects without stenosis. Right M1 widely patent without stenosis. Normal right MCA bifurcation. No proximal right M2 occlusion. Distal right MCA branches well perfused. Left M1 duplicated and/or bifurcates early at the origin. No M1 stenosis or occlusion. No proximal left M2 occlusion. Distal left MCA branches well perfused. POSTERIOR CIRCULATION: Vertebral arteries patent to the vertebrobasilar junction without stenosis. Right vertebral artery dominant. Posterior inferior cerebral arteries not visualized. Basilar widely patent to its distal aspect without stenosis. Superior cerebral arteries patent bilaterally. Posterior cerebral arteries well perfused to their distal aspects without stenosis. Prominent left posterior communicating artery noted. IMPRESSION: MRI HEAD IMPRESSION: 1. Small to moderate evolving acute left MCA territory infarct as above. Associated petechial hemorrhage without frank hemorrhagic transformation or  significant mass effect. 2. Underlying moderate chronic microvascular ischemic disease. MRA HEAD IMPRESSION: 1. Negative intracranial MRA. No evidence for large vessel occlusion status post catheter directed thrombectomy. No hemodynamically significant or correctable stenosis identified. 2. Duplicated left M1 segment. Electronically Signed   By: Jeannine Boga M.D.   On: 07/06/2018 19:43   Mr Brain Wo Contrast  Result Date: 07/06/2018 CLINICAL DATA:  Follow-up examination for acute stroke. 77 year old female who presented with aphasia and right-sided weakness, status post tPA and catheter directed thrombectomy. EXAM: MRI HEAD WITHOUT CONTRAST MRA HEAD WITHOUT CONTRAST TECHNIQUE: Multiplanar, multiecho pulse sequences of the brain and surrounding structures were obtained without intravenous contrast. Angiographic images of the head were obtained using MRA technique without contrast. COMPARISON:  Prior CT and arteriogram from 07/05/2018. FINDINGS: MRI HEAD FINDINGS Brain: Generalized age-related cerebral atrophy. Patchy and confluent T2/FLAIR hyperintensity within the periventricular and deep white matter both cerebral hemispheres, most consistent with chronic small vessel ischemic disease, moderate in nature. Patchy small moderate volume restricted diffusion seen involving the left insula and overlying left frontal operculum, extending to involve the left periventricular white matter, consistent with acute left MCA territory infarct (series 3, image 26). Associated mild localized gyral swelling and edema without significant mass effect. Patchy susceptibility artifact within the area of infarction consistent with associated petechial hemorrhage (series 6, image 54). No frank hemorrhagic transformation by MRI. No other evidence for acute or subacute ischemia. Gray-white matter differentiation otherwise maintained. Few small chronic micro hemorrhages noted involving the temporal lobes bilaterally. Probable 12  mm densely calcified meningioma overlies the anterior left frontal convexity. No associated edema or mass effect. No other mass lesion. No midline shift or mass  effect. No hydrocephalus. Pituitary gland within normal limits. Vascular: Major intracranial vascular flow voids maintained. Skull and upper cervical spine: Craniocervical junction within normal limits. Bone marrow signal intensity normal. Mild soft tissue swelling noted at the left frontoparietal scalp near the vertex (series 11, image 12). Sinuses/Orbits: Globes and orbital soft tissues within normal limits. Paranasal sinuses are clear. Small left mastoid effusion, of doubtful significance. Inner ear structures grossly normal. Other: None. MRA HEAD FINDINGS ANTERIOR CIRCULATION: Distal cervical segments of the internal carotid arteries are widely patent with antegrade flow. Petrous, cavernous, and supraclinoid segments widely patent without stenosis. Origin of the ophthalmic arteries patent bilaterally. ICA termini well perfused and symmetric. A1 segments patent bilaterally. Normal anterior communicating artery. Anterior cerebral arteries widely patent to their distal aspects without stenosis. Right M1 widely patent without stenosis. Normal right MCA bifurcation. No proximal right M2 occlusion. Distal right MCA branches well perfused. Left M1 duplicated and/or bifurcates early at the origin. No M1 stenosis or occlusion. No proximal left M2 occlusion. Distal left MCA branches well perfused. POSTERIOR CIRCULATION: Vertebral arteries patent to the vertebrobasilar junction without stenosis. Right vertebral artery dominant. Posterior inferior cerebral arteries not visualized. Basilar widely patent to its distal aspect without stenosis. Superior cerebral arteries patent bilaterally. Posterior cerebral arteries well perfused to their distal aspects without stenosis. Prominent left posterior communicating artery noted. IMPRESSION: MRI HEAD IMPRESSION: 1. Small to  moderate evolving acute left MCA territory infarct as above. Associated petechial hemorrhage without frank hemorrhagic transformation or significant mass effect. 2. Underlying moderate chronic microvascular ischemic disease. MRA HEAD IMPRESSION: 1. Negative intracranial MRA. No evidence for large vessel occlusion status post catheter directed thrombectomy. No hemodynamically significant or correctable stenosis identified. 2. Duplicated left M1 segment. Electronically Signed   By: Jeannine Boga M.D.   On: 07/06/2018 19:43   [place]  Result Date: 07/07/2018 INDICATION: Global aphasia. Right-sided hemiplegia. Occluded inferior division of the left middle cerebral artery M2 M3 region EXAM: 1. EMERGENT LARGE VESSEL OCCLUSION THROMBOLYSIS (POSTERIOR CIRCULATION) MEDICATIONS: No antibiotic was administered within 1 hour of the procedure. ANESTHESIA/SEDATION: General anesthesia CONTRAST:  Isovue 300 approximately 110 mL. FLUOROSCOPY TIME:  Fluoroscopy Time: 73 minutes 24 seconds (3818 mGy). COMPLICATIONS: None immediate. TECHNIQUE: Following a full explanation of the procedure along with the potential associated complications, an informed witnessed consent was obtained from the patient's husband. The risks of intracranial hemorrhage of 10%, worsening neurological deficit, ventilator dependency, death and inability to revascularize were all reviewed in detail with the patient's husband. The patient was then put under general anesthesia by the Department of Anesthesiology at [facility]. The right groin was prepped and draped in the usual sterile fashion. Thereafter using modified Seldinger technique, transfemoral access into the right common femoral artery was obtained without difficulty. Over a 0.035 inch guidewire a 5 French Pinnacle sheath was inserted. Through this, and also over a 0.035 inch guidewire a 5 [place] JB 1 catheter was advanced to the aortic arch region and selectively positioned in  the left common carotid artery. FINDINGS: The left common carotid arteriogram demonstrates the left external carotid artery and its major branches to be widely patent. The left internal carotid artery at the bulb demonstrates approximately 60% stenosis by the NASCET criteria secondary to a smooth posterior wall atherosclerotic plaque. Distal to this the left internal carotid artery is seen to opacify to the cranial skull base. The petrous, cavernous and supraclinoid segments are widely patent. A left posterior communicating artery is seen opacifying  the left posterior cerebral artery distribution. 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 The inferior division from the superior duplicated branch demonstrates complete angiographic occlusion. The left anterior cerebral artery opacifies into the capillary and venous phases. The lateral projection of the whole head run demonstrates a large area of hypoperfusion involving the left parietal and posterior frontal regions. PROCEDURE: ENDOVASCULAR REVASCULARIZATION OF OCCLUDED INFERIOR DIVISION OF THE UPPER BRANCH OF THE LEFT MIDDLE CEREBRAL ARTERY, WITH 2 PASSES WITH THE TREVO PROVUE 3 MM X 20 MM RETRIEVAL DEVICE, AND 1 PASS WITH THE 5 MM X 33 MM EMBOTRAP RETRIEVAL DEVICE ACHIEVING A TICI 3 REVASCULARIZATION. The diagnostic JB 1 catheter in the left common carotid artery was exchanged over a 0.035 inch 300 cm Rosen exchange guidewire for an 8 French 55 cm Brite tip neurovascular sheath using biplane roadmap technique and constant fluoroscopic guidance. Good aspiration obtained from the hub of the neurovascular sheath. This was then connected to continuous heparinized saline infusion. Over the [employer] guidewire, an 8 [place] 85 cm FlowGate balloon guide catheter which had been prepped with 50% contrast and 50% heparinized saline infusion was positioned just proximal to the left common carotid bifurcation. The guidewire was  removed. Good aspiration obtained from the hub of the [facility] guide catheter. Gentle contrast injection demonstrated no evidence of spasms, dissections or of intraluminal filling defects. A combination of a Catalyst 5 French 132 cm guide catheter inside of which was an 021 Trevo ProVue microcatheter was advanced over a 0.014 inch Softip Synchro micro guidewire to the distal end of the [facility] guide catheter. With the micro guidewire leading with a J-tip configuration to avoid dissections or inducing spasm, the combination was navigated without difficulty to the supraclinoid left ICA. The superior division was then selected with the micro guidewire followed by the microcatheter. Access through the occluded inferior division was then obtained with the micro guidewire to the M2 M3 regions followed by the microcatheter. The guidewire was removed. Good aspiration obtained from the hub of the microcatheter. A gentle contrast injection demonstrated slow distal antegrade flow. The microcatheter was then connected to continuous heparinized saline infusion. A 3 mm x 20 mm Trevo ProVue retrieval device was advanced to the distal end of microcatheter. The proximal and the distal landing zones were then defined. The O ring on the delivery microcatheter was loosened. With slight forward gentle traction with the right hand on the delivery micro guidewire, with the left hand the delivery microcatheter was retrieved unsheathing the retrieval device. A control arteriogram performed through the Catalyst guide catheter in the supraclinoid left ICA demonstrated partial revascularization of the inferior division of the left middle cerebral artery. With proximal flow arrest in the left common carotid artery at the site of bifurcation, the combination of the retrieval device, the microcatheter, and the Catalyst guide catheter was retrieved as constant aspiration was applied with a 60 mL syringe at the hub of the [facility] guide catheter,  and a Penumbra vacuum suction device at the hub of the Catalyst guide catheter. The combinations were retrieved and removed. No evidence of clot was seen in the retrieval device, or in the aspirate. Aspiration was continued as the proximal flow arrest was reversed. A control arteriogram performed through the [facility] guide catheter in the left common carotid artery demonstrated no change in the occluded inferior division of the superior branch of the duplicated left MCA. A second attempt was then made again using the above combination. Again access  into the distal M2 M3 region of the occluded vessel was obtained with the micro guidewire followed by the microcatheter. After having verified safe position of tip of the microcatheter, the 3 mm x 20 mm Trevo ProVue device was again deployed as above. With proximal flow arrest in the left common carotid artery by inflating the balloon of the [facility] guide catheter, the combination of the retrieval device, microcatheter and the Catalyst guide catheter were retrieved and removed. Again aspiration was applied with a 60 mL syringe at the hub of the [facility] guide catheter, and the Penumbra suction device at the hub of the Catalyst guide catheter. Aspiration was continued as flow arrest was reversed. A control arteriogram performed again through the [facility] guide catheter in the left common carotid artery continued to demonstrate no real opacification of the inferior division of the superior branch of the duplicated left middle cerebral artery. A controlled arteriogram performed was then made using the combination of the Trevo ProVue microcatheter inside of a 5 French 132 cm Catalyst guide catheter which was advanced to the left middle cerebral artery over a 0.014 inch Softip Synchro micro guidewire. Again access into the occluded inferior division was obtained with the micro guidewire followed by the microcatheter which was positioned in the M3 region of the left middle  cerebral artery. The guidewire was removed. Again after having verified safe position of the tip of the microcatheter, a 5 mm x 33 mm Embotrap retrieval device was advanced to the distal end of the microcatheter. The proximal and the distal landing zones were defined. With proximal flow arrest in the left internal carotid artery, the combination of the retrieval device, the microcatheter, and the 5 French 132 cm Catalyst guide catheter were retrieved and removed as constant aspiration was applied with a 60 mL syringe at the hub of the [facility] guide catheter, and the Penumbra suction device at the hub of the Catalyst guide catheter. The combination was retrieved and removed. A clot was noted entangled and the cells of the retrieval device. The aspirate was continued as flow arrest was reversed by deflating the balloon in the left common carotid artery. An arteriogram performed with free back bleed of blood at the hub of the [facility] guide catheter demonstrated now complete revascularization of the previously occluded inferior division of the superior branch of the left middle cerebral artery. A control arteriogram performed centered over the whole head now demonstrated reperfusion of the previously noted hypoperfused parietal and the posterior frontal regions. A TICI 3 revascularization had been achieved of the left middle cerebral distribution. A control arteriogram performed through the [facility] guide catheter in the left common carotid artery centered over the carotid bifurcation continued to demonstrate 60% stenosis of the proximal left internal carotid artery at the bulb. No evidence of dissections or of intraluminal filling defects were noted extra cranially. The 8 [place] FlowGate guide catheter, and the 8 [place] neurovascular sheath were then removed over a J-tip guidewire for an 8 [place] Pinnacle sheath. This was then removed with the successful application of a 7 [place] Exo-Seal closure device achieving  hemostasis. Distal pulses in both feet were Dopplerable in the dorsalis pedis, and the posterior tibial regions bilaterally unchanged from prior to the beginning of the procedure. The right groin appeared soft without evidence of hematoma or bleeding. A Dyna CT of the brain performed demonstrated a focal area of hyperdensity in the subcortical insular area lateral to the putamen and a small one just cranial and lateral  to this. No known gross hemorrhages, or mass effect or midline shift was noted. The patient was left intubated on account of her difficulty with comprehension prior to the intubation. The patient was then transferred to the neuro ICU to continue with post thrombectomy management. IMPRESSION: Status post endovascular complete revascularization of occluded inferior division of the superior duplicated left middle cerebral artery with 2 passes with the 3 mm x 20 mm Trevo ProVue retrieval device, and 1 pass with the Embotrap 5 mm x 33 mm retrieval device achieving a revascularization. PLAN: Follow-up in the clinic in 4 to 6 weeks post discharge. Electronically Signed   By: Luanne Bras M.D.   On: 07/06/2018 14:56   Dg Chest Port 1 View  Result Date: 07/07/2018 CLINICAL DATA:  CHF. Hx of afib, chronic systolic heart failure, COPD, coronary artery disease, hypertension, pre-diabetes, left heart cath and coronary angiography(04/29/17). Former [phone]). EXAM: PORTABLE CHEST 1 VIEW COMPARISON:  07/06/2018 FINDINGS: Moderate enlargement of the cardiopericardial silhouette no overt edema. Atherosclerotic calcification of the aortic arch. The lungs appear otherwise clear. Degenerative glenohumeral spurring. IMPRESSION: 1. Moderate enlargement of the cardiopericardial silhouette, without edema. 2. Degenerative glenohumeral arthropathy bilaterally. Electronically Signed   By: Van Clines M.D.   On: 07/07/2018 11:48   Portable Chest X-ray  Result Date: 07/06/2018 CLINICAL DATA:  Endotracheal  intubation EXAM: PORTABLE CHEST 1 VIEW COMPARISON:  None. FINDINGS: Endotracheal tube tip is at the level of the clavicular heads. There is moderate cardiomegaly without pulmonary edema. No pneumothorax. No focal airspace consolidation. IMPRESSION: 1. Endotracheal tube tip at the level of the clavicular heads. 2. Cardiomegaly. Electronically Signed   By: Ulyses Jarred M.D.   On: 07/06/2018 02:24   Dg Abd Portable 1v  Result Date: 07/06/2018 CLINICAL DATA:  Orogastric tube placement EXAM: PORTABLE ABDOMEN - 1 VIEW COMPARISON:  None. FINDINGS: OG tube tip and side port project over the midline upper abdomen. The tip is in the expected location of the gastroduodenal junction. IMPRESSION: OG tube tip in the expected location of the gastroduodenal junction. Electronically Signed   By: Ulyses Jarred M.D.   On: 07/06/2018 02:23   Ir Percutaneous Art Thrombectomy/infusion Intracranial Inc Diag Angio  Result Date: 07/07/2018 INDICATION: Global aphasia. Right-sided hemiplegia. Occluded inferior division of the left middle cerebral artery M2 M3 region EXAM: 1. EMERGENT LARGE VESSEL OCCLUSION THROMBOLYSIS (POSTERIOR CIRCULATION) MEDICATIONS: No antibiotic was administered within 1 hour of the procedure. ANESTHESIA/SEDATION: General anesthesia CONTRAST:  Isovue 300 approximately 110 mL. FLUOROSCOPY TIME:  Fluoroscopy Time: 73 minutes 24 seconds (3818 mGy). COMPLICATIONS: None immediate. TECHNIQUE: Following a full explanation of the procedure along with the potential associated complications, an informed witnessed consent was obtained from the patient's husband. The risks of intracranial hemorrhage of 10%, worsening neurological deficit, ventilator dependency, death and inability to revascularize were all reviewed in detail with the patient's husband. The patient was then put under general anesthesia by the Department of Anesthesiology at [facility]. The right groin was prepped and draped in the usual sterile  fashion. Thereafter using modified Seldinger technique, transfemoral access into the right common femoral artery was obtained without difficulty. Over a 0.035 inch guidewire a 5 French Pinnacle sheath was inserted. Through this, and also over a 0.035 inch guidewire a 5 [place] JB 1 catheter was advanced to the aortic arch region and selectively positioned in the left common carotid artery. FINDINGS: The left common carotid arteriogram demonstrates the left external carotid artery and its major branches to be  widely patent. The left internal carotid artery at the bulb demonstrates approximately 60% stenosis by the NASCET criteria secondary to a smooth posterior wall atherosclerotic plaque. Distal to this the left internal carotid artery is seen to opacify to the cranial skull base. The petrous, cavernous and supraclinoid segments are widely patent. A left posterior communicating artery is seen opacifying the left posterior cerebral artery distribution. 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 The inferior division from the superior duplicated branch demonstrates complete angiographic occlusion. The left anterior cerebral artery opacifies into the capillary and venous phases. The lateral projection of the whole head run demonstrates a large area of hypoperfusion involving the left parietal and posterior frontal regions. PROCEDURE: ENDOVASCULAR REVASCULARIZATION OF OCCLUDED INFERIOR DIVISION OF THE UPPER BRANCH OF THE LEFT MIDDLE CEREBRAL ARTERY, WITH 2 PASSES WITH THE TREVO PROVUE 3 MM X 20 MM RETRIEVAL DEVICE, AND 1 PASS WITH THE 5 MM X 33 MM EMBOTRAP RETRIEVAL DEVICE ACHIEVING A TICI 3 REVASCULARIZATION. The diagnostic JB 1 catheter in the left common carotid artery was exchanged over a 0.035 inch 300 cm Rosen exchange guidewire for an 8 French 55 cm Brite tip neurovascular sheath using biplane roadmap technique and constant fluoroscopic guidance. Good aspiration  obtained from the hub of the neurovascular sheath. This was then connected to continuous heparinized saline infusion. Over the [employer] guidewire, an 8 [place] 85 cm FlowGate balloon guide catheter which had been prepped with 50% contrast and 50% heparinized saline infusion was positioned just proximal to the left common carotid bifurcation. The guidewire was removed. Good aspiration obtained from the hub of the [facility] guide catheter. Gentle contrast injection demonstrated no evidence of spasms, dissections or of intraluminal filling defects. A combination of a Catalyst 5 French 132 cm guide catheter inside of which was an 021 Trevo ProVue microcatheter was advanced over a 0.014 inch Softip Synchro micro guidewire to the distal end of the [facility] guide catheter. With the micro guidewire leading with a J-tip configuration to avoid dissections or inducing spasm, the combination was navigated without difficulty to the supraclinoid left ICA. The superior division was then selected with the micro guidewire followed by the microcatheter. Access through the occluded inferior division was then obtained with the micro guidewire to the M2 M3 regions followed by the microcatheter. The guidewire was removed. Good aspiration obtained from the hub of the microcatheter. A gentle contrast injection demonstrated slow distal antegrade flow. The microcatheter was then connected to continuous heparinized saline infusion. A 3 mm x 20 mm Trevo ProVue retrieval device was advanced to the distal end of microcatheter. The proximal and the distal landing zones were then defined. The O ring on the delivery microcatheter was loosened. With slight forward gentle traction with the right hand on the delivery micro guidewire, with the left hand the delivery microcatheter was retrieved unsheathing the retrieval device. A control arteriogram performed through the Catalyst guide catheter in the supraclinoid left ICA demonstrated partial  revascularization of the inferior division of the left middle cerebral artery. With proximal flow arrest in the left common carotid artery at the site of bifurcation, the combination of the retrieval device, the microcatheter, and the Catalyst guide catheter was retrieved as constant aspiration was applied with a 60 mL syringe at the hub of the [facility] guide catheter, and a Penumbra vacuum suction device at the hub of the Catalyst guide catheter. The combinations were retrieved and removed. No evidence of clot was seen in the retrieval  device, or in the aspirate. Aspiration was continued as the proximal flow arrest was reversed. A control arteriogram performed through the [facility] guide catheter in the left common carotid artery demonstrated no change in the occluded inferior division of the superior branch of the duplicated left MCA. A second attempt was then made again using the above combination. Again access into the distal M2 M3 region of the occluded vessel was obtained with the micro guidewire followed by the microcatheter. After having verified safe position of tip of the microcatheter, the 3 mm x 20 mm Trevo ProVue device was again deployed as above. With proximal flow arrest in the left common carotid artery by inflating the balloon of the [facility] guide catheter, the combination of the retrieval device, microcatheter and the Catalyst guide catheter were retrieved and removed. Again aspiration was applied with a 60 mL syringe at the hub of the [facility] guide catheter, and the Penumbra suction device at the hub of the Catalyst guide catheter. Aspiration was continued as flow arrest was reversed. A control arteriogram performed again through the [facility] guide catheter in the left common carotid artery continued to demonstrate no real opacification of the inferior division of the superior branch of the duplicated left middle cerebral artery. A controlled arteriogram performed was then made using the  combination of the Trevo ProVue microcatheter inside of a 5 French 132 cm Catalyst guide catheter which was advanced to the left middle cerebral artery over a 0.014 inch Softip Synchro micro guidewire. Again access into the occluded inferior division was obtained with the micro guidewire followed by the microcatheter which was positioned in the M3 region of the left middle cerebral artery. The guidewire was removed. Again after having verified safe position of the tip of the microcatheter, a 5 mm x 33 mm Embotrap retrieval device was advanced to the distal end of the microcatheter. The proximal and the distal landing zones were defined. With proximal flow arrest in the left internal carotid artery, the combination of the retrieval device, the microcatheter, and the 5 French 132 cm Catalyst guide catheter were retrieved and removed as constant aspiration was applied with a 60 mL syringe at the hub of the [facility] guide catheter, and the Penumbra suction device at the hub of the Catalyst guide catheter. The combination was retrieved and removed. A clot was noted entangled and the cells of the retrieval device. The aspirate was continued as flow arrest was reversed by deflating the balloon in the left common carotid artery. An arteriogram performed with free back bleed of blood at the hub of the [facility] guide catheter demonstrated now complete revascularization of the previously occluded inferior division of the superior branch of the left middle cerebral artery. A control arteriogram performed centered over the whole head now demonstrated reperfusion of the previously noted hypoperfused parietal and the posterior frontal regions. A TICI 3 revascularization had been achieved of the left middle cerebral distribution. A control arteriogram performed through the [facility] guide catheter in the left common carotid artery centered over the carotid bifurcation continued to demonstrate 60% stenosis of the proximal left  internal carotid artery at the bulb. No evidence of dissections or of intraluminal filling defects were noted extra cranially. The 8 [place] FlowGate guide catheter, and the 8 [place] neurovascular sheath were then removed over a J-tip guidewire for an 8 [place] Pinnacle sheath. This was then removed with the successful application of a 7 [place] Exo-Seal closure device achieving hemostasis. Distal pulses in both feet were Dopplerable  in the dorsalis pedis, and the posterior tibial regions bilaterally unchanged from prior to the beginning of the procedure. The right groin appeared soft without evidence of hematoma or bleeding. A Dyna CT of the brain performed demonstrated a focal area of hyperdensity in the subcortical insular area lateral to the putamen and a small one just cranial and lateral to this. No known gross hemorrhages, or mass effect or midline shift was noted. The patient was left intubated on account of her difficulty with comprehension prior to the intubation. The patient was then transferred to the neuro ICU to continue with post thrombectomy management. IMPRESSION: Status post endovascular complete revascularization of occluded inferior division of the superior duplicated left middle cerebral artery with 2 passes with the 3 mm x 20 mm Trevo ProVue retrieval device, and 1 pass with the Embotrap 5 mm x 33 mm retrieval device achieving a revascularization. PLAN: Follow-up in the clinic in 4 to 6 weeks post discharge. Electronically Signed   By: Luanne Bras M.D.   On: 07/06/2018 14:56   Carotid (at [place] Only)  Result Date: 07/07/2018 Carotid Arterial Duplex Study Indications: CVA. Performing Technologist: Oliver Hum RVT  Examination Guidelines: A complete evaluation includes B-mode imaging, spectral Doppler, color Doppler, and power Doppler as needed of all accessible portions of each vessel. Bilateral testing is considered an integral part of a complete examination. Limited  examinations for reoccurring indications may be performed as noted.  Right Carotid Findings: +----------+--------+-------+--------+--------------------------------+--------+           PSV cm/sEDV    StenosisDescribe                        Comments                   cm/s                                                    +----------+--------+-------+--------+--------------------------------+--------+ CCA Prox  65      17             smooth and heterogenous                  +----------+--------+-------+--------+--------------------------------+--------+ CCA Distal53      13             smooth, heterogenous and                                                  calcific                                 +----------+--------+-------+--------+--------------------------------+--------+ ICA Prox  56      16             smooth, heterogenous and                                                  calcific                                 +----------+--------+-------+--------+--------------------------------+--------+  ICA Distal73      22                                             tortuous +----------+--------+-------+--------+--------------------------------+--------+ ECA       331     37             calcific                                 +----------+--------+-------+--------+--------------------------------+--------+ +----------+--------+-------+--------+-------------------+           PSV cm/sEDV cmsDescribeArm Pressure (mmHG) +----------+--------+-------+--------+-------------------+ [id number]                                        +----------+--------+-------+--------+-------------------+ +---------+--------+--+--------+--+---------+ VertebralPSV cm/s60EDV cm/s20Antegrade +---------+--------+--+--------+--+---------+  Left Carotid Findings: +----------+--------+-------+--------+--------------------------------+--------+           PSV  cm/sEDV    StenosisDescribe                        Comments                   cm/s                                                    +----------+--------+-------+--------+--------------------------------+--------+ CCA Prox  70      20             smooth and heterogenous                  +----------+--------+-------+--------+--------------------------------+--------+ CCA Distal68      14             smooth, heterogenous and                                                  calcific                                 +----------+--------+-------+--------+--------------------------------+--------+ ICA Prox  166     44             smooth, heterogenous and                                                  calcific                                 +----------+--------+-------+--------+--------------------------------+--------+ ICA Mid   102     23  tortuous +----------+--------+-------+--------+--------------------------------+--------+ ICA Distal56      18                                             tortuous +----------+--------+-------+--------+--------------------------------+--------+ ECA       57      11             calcific                                 +----------+--------+-------+--------+--------------------------------+--------+ +----------+--------+--------+--------+-------------------+ SubclavianPSV cm/sEDV cm/sDescribeArm Pressure (mmHG) +----------+--------+--------+--------+-------------------+           102                                         +----------+--------+--------+--------+-------------------+ +---------+--------+--+--------+--+---------+ VertebralPSV cm/s54EDV cm/s15Antegrade +---------+--------+--+--------+--+---------+  Summary: Right Carotid: Velocities in the right ICA are consistent with a 1-39% stenosis. Left Carotid: Velocities in the left ICA are consistent  with a 40-59% stenosis. Vertebrals: Bilateral vertebral arteries demonstrate antegrade flow. *See table(s) above for measurements and observations.  Electronically signed by Antony Contras MD on 07/07/2018 at 8:02:21 AM.    Final      PHYSICAL EXAM  Temp:  [97.9 F (36.6 C)-101.2 F (38.4 C)] 97.9 F (36.6 C) (11/20 0800) Pulse Rate:  [62-91] 91 (11/20 0800) Resp:  [16-26] 26 (11/20 0800) BP: (115-168)/(48-87) 156/69 (11/20 0800) SpO2:  [93 %-100 %] 95 % (11/20 0800)  General - Well nourished, well developed, mildly lethargic after extubation  Ophthalmologic - fundi not visualized due to noncooperation.  Cardiovascular - irregularly irregular heart rate and rhythm.  Neuro - awake alert, sitting in bed, eyes open and smiling to provider, and following some simple commands but not all of them. Still has expressive aphasia except respond with "good morning" but severe dysarthria. Eyes move in both directions, no forced gaze. Right facial droop, tongue midline, blinking to visual threat bilaterally. LUE and LLE 4+/5, RUE 4/5 proximal but 2/5 distal finger movement. RLE 3+/5 proximal and 4/5 distally. DTR 1+ and positive babinski on the right. Sensation and coordination not cooperative. Gait not tested.    ASSESSMENT/PLAN Ms. KODA DEFRANK is a 77 y.o. female with history of afib not on AC, CAD, HTN, HLD, DM and obsity admitted for aphasia and right sided weakness. TPA given and s/p thrombectomy.    Stroke:  left MCA infarct with left M2 occlusion s/p tPA and IR with TICI3 reperfusion, embolic secondary to Afib not on AC  Resultant right hemiparesis, expressive aphasia  CTA head and neck - duplicated left MCAs with left M2/M3 occlusion  DSA left M2/M3 occlusion s/p TICI3 reperfusion  MRI left MCA small to moderate infarcts   MRA unremarkable, duplicated MCA  CUS left ICA 40-59% stenosis  2D Echo EF 25-30%  LDL 72  HgbA1c 5.6  SCDs for VTE prophylaxis  aspirin 325 mg daily  prior to admission, now on ASA 81mg  vs. PR 300mg . Will start [place] for stroke prevention tomorrow given the size of stroke.   Ongoing aggressive stroke risk factor management  Therapy recommendations:  pending  Disposition:  Pending  CHF  EF 25-30%  CCM on board  received IV lasix 40 07/07/18  Off IVF  Continue digoxin and  metoprolol  CXR pending  Chronic afib on ASA   Followed with Dr. Geraldo Pitter  Not on [facility] due to back surgery with unsteady gait  On ASA 325 PTA  Rate controlled  On digoxin and metoprolol PTA  Now on ASA, will start DOACs tomorrow   Spiking fever  Tmax 101.2  Afebrile this am  CXR 07/07/18 - Moderate enlargement of the cardiopericardial silhouette, without edema.  CXR pending this am  UA pending   Diabetes  HgbA1c 5.6 goal < 7.0  Controlled  CBG monitoring  SSI  Hypertension . Stable . BP goal < 160  Long term BP goal normotensive  Hyperlipidemia  Home meds:  none   LDL 72, goal < 70  Consider statin once po access  Dysphagia   Did not pass swallow  Pending MBS  NPO for now  Speech on board  Other Stroke Risk Factors  Advanced age  Obesity, Body mass index is 36.58 kg/m.   Coronary artery disease  Other Active Problems  Depression on zoloft  Hospital day # 3  This patient is critically ill due to left MCA infarct, s/p tPA and IR, respiratory failure, afib not on AC and at significant risk of neurological worsening, death form recurrent stroke, hemorrhagic conversion, seizure, heart failure, cardiac arrest. This patient's care requires constant monitoring of vital signs, hemodynamics, respiratory and cardiac monitoring, review of multiple databases, neurological assessment, discussion with family, other specialists and medical decision making of high complexity. I spent 35 minutes of neurocritical care time in the care of this patient. I have discussed with Dr. Ashley Mariner.    Rosalin Hawking, MD PhD Stroke  Neurology 07/08/2018 9:38 AM    To contact Stroke Continuity provider, please refer to [email or web address] After hours, contact General Neurology

## 2018-07-08 NOTE — Evaluation (Signed)
Clinical/Bedside Swallow Evaluation Patient Details  Name: Kaitlyn Underwood MRN: [id number] Date of Birth: [birth year]  Today's Date: 07/08/2018 Time: SLP Start Time (ACUTE ONLY): 5009 SLP Stop Time (ACUTE ONLY): 0836 SLP Time Calculation (min) (ACUTE ONLY): 14 min  Past Medical History:  Past Medical History:  Diagnosis Date  . Anxiety   . Atrial fibrillation ([place])   . Chronic systolic heart failure ([place])   . COPD (chronic obstructive pulmonary disease) ([place])   . Coronary artery disease   . Hypertension   . Hypothyroidism   . Left renal mass    unconfirmed documentation of "kidney cancer", possibly cyst  . Pre-diabetes    Past Surgical History:  Past Surgical History:  Procedure Laterality Date  . IR CT HEAD LTD  07/06/2018  . IR PERCUTANEOUS ART THROMBECTOMY/INFUSION INTRACRANIAL INC DIAG ANGIO  07/06/2018  . LEFT HEART CATH AND CORONARY ANGIOGRAPHY N/A 04/29/2017   Procedure: LEFT HEART CATH AND CORONARY ANGIOGRAPHY;  Surgeon: Belva Crome, MD;  Location: [place] [facility];  Service: Cardiovascular;  Laterality: N/A;  . RADIOLOGY WITH ANESTHESIA N/A 07/05/2018   Procedure: RADIOLOGY WITH ANESTHESIA;  Surgeon: Luanne Bras, MD;  Location: [facility];  Service: Radiology;  Laterality: N/A;  . ULTRASOUND GUIDANCE FOR VASCULAR ACCESS  04/29/2017   Procedure: Ultrasound Guidance For Vascular Access;  Surgeon: Belva Crome, MD;  Location: [place] [facility];  Service: Cardiovascular;;   HPI:  Kaitlyn Underwood is a 77 y.o. female with a history of HTN, hyperlipidemia, COPD, afib who presented with R weakness and aphasia. CTA showed occluded distal LT MCA - M2, had TPA and emergent thrombectomy at [facility]. ETT 11/17-11/19. Per MD note, pt has mild wheezing and secretions. CXR moderate enlargement of the cardiopericardial silhouette, without edema and degenerative glenohumeral arthropathy bilaterally. 11/18 MRI small to moderate evolving acute L MCA territory infarct, associated petechial  hemorrhage without frank hemorrhagic transformation.    Assessment / Plan / Recommendation Clinical Impression  Pt was alert and cooperative throughout bedside swallow evaluation. Her verbal expression is limited due to severe expressive aphasia, however she was able to nod her head yes/shake her head no appropriately to answer some questions. Oral motor examination was limited due pt's difficulty with motor planning (suspect oral apraxia); pt could only achieve anterior lingual protrusion on command. Her tongue remains slightly protruded and oral cavity in static open position at rest. Furthermore, she is unable to elicit a volitional cough. Following oral care, pt displayed delayed coughing and immediate throat clear following her consumption of thin liquids. Oral residue was also observed with bites of puree. Recommend pt continue NPO until instrumental MBSS can be completed at 11:30 today.   SLP Visit Diagnosis: Dysphagia, oropharyngeal phase (R13.12)    Aspiration Risk  Moderate aspiration risk    Diet Recommendation NPO        Other  Recommendations Oral Care Recommendations: Oral care QID   Follow up Recommendations Other (comment)(TBD)      Frequency and Duration            Prognosis Prognosis for Safe Diet Advancement: Fair Barriers to Reach Goals: Severity of deficits      Swallow Study   General HPI: Kaitlyn Underwood is a 77 y.o. female with a history of HTN, hyperlipidemia, COPD, afib who presented with R weakness and aphasia. CTA showed occluded distal LT MCA - M2, had TPA and emergent thrombectomy at [facility]. ETT 11/17-11/19. Per MD note, pt has mild wheezing and secretions.  CXR moderate enlargement of the cardiopericardial silhouette, without edema and degenerative glenohumeral arthropathy bilaterally. 11/18 MRI small to moderate evolving acute L MCA territory infarct, associated petechial hemorrhage without frank hemorrhagic transformation.  Type of Study: Bedside Swallow  Evaluation Previous Swallow Assessment: none found in chart Diet Prior to this Study: NPO Temperature Spikes Noted: No Respiratory Status: Room air History of Recent Intubation: Yes Length of Intubations (days): 3 days Date extubated: 07/07/18 Behavior/Cognition: Alert;Cooperative;Pleasant mood Oral Cavity Assessment: Dry Oral Care Completed by SLP: Yes Oral Cavity - Dentition: Edentulous;Dentures, not available Self-Feeding Abilities: Needs assist Patient Positioning: Upright in bed Baseline Vocal Quality: Normal Volitional Cough: Cognitively unable to elicit    Oral/Motor/Sensory Function Overall Oral Motor/Sensory Function: Within functional limits(for tasks assessed)   Ice Chips Ice chips: Not tested   Thin Liquid Thin Liquid: Impaired Presentation: Cup;Straw Oral Phase Impairments: Reduced labial seal Oral Phase Functional Implications: Right anterior spillage;Left anterior spillage Pharyngeal  Phase Impairments: Cough - Delayed;Throat Clearing - Immediate    Nectar Thick Nectar Thick Liquid: Not tested   Honey Thick Honey Thick Liquid: Not tested   Puree Puree: Impaired Presentation: Spoon Oral Phase Impairments: (none) Oral Phase Functional Implications: Oral residue Pharyngeal Phase Impairments: (none)   Solid     Solid: Not tested     Jettie Booze, Student SLP  Jettie Booze 07/08/2018,9:57 AM

## 2018-07-09 DIAGNOSIS — I63512 Cerebral infarction due to unspecified occlusion or stenosis of left middle cerebral artery: Principal | ICD-10-CM

## 2018-07-09 LAB — CBC
HCT: 33.6 % — ABNORMAL LOW (ref 36.0–46.0)
Hemoglobin: 10.4 g/dL — ABNORMAL LOW (ref 12.0–15.0)
MCH: 30.2 pg (ref 26.0–34.0)
MCHC: 31 g/dL (ref 30.0–36.0)
MCV: 97.7 fL (ref 80.0–100.0)
NRBC: 0 % (ref 0.0–0.2)
Platelets: 320 10*3/uL (ref 150–400)
RBC: 3.44 MIL/uL — ABNORMAL LOW (ref 3.87–5.11)
RDW: 14 % (ref 11.5–15.5)
WBC: 8.8 10*3/uL (ref 4.0–10.5)

## 2018-07-09 LAB — BASIC METABOLIC PANEL
ANION GAP: 8 (ref 5–15)
BUN: 15 mg/dL (ref 8–23)
CALCIUM: 9.7 mg/dL (ref 8.9–10.3)
CHLORIDE: 113 mmol/L — AB (ref 98–111)
CO2: 22 mmol/L (ref 22–32)
CREATININE: 0.96 mg/dL (ref 0.44–1.00)
GFR calc non Af Amer: 56 mL/min — ABNORMAL LOW (ref >60)
Glucose, Bld: 101 mg/dL — ABNORMAL HIGH (ref 70–99)
Potassium: 4.1 mmol/L (ref 3.5–5.1)
SODIUM: 143 mmol/L (ref 135–145)

## 2018-07-09 LAB — GLUCOSE, CAPILLARY
GLUCOSE-CAPILLARY: 101 mg/dL — AB (ref 70–99)
GLUCOSE-CAPILLARY: 88 mg/dL (ref 70–99)
GLUCOSE-CAPILLARY: 99 mg/dL (ref 70–99)
Glucose-Capillary: 107 mg/dL — ABNORMAL HIGH (ref 70–99)
Glucose-Capillary: 130 mg/dL — ABNORMAL HIGH (ref 70–99)
Glucose-Capillary: 122 mg/dL — ABNORMAL HIGH (ref 70–99)

## 2018-07-09 MED ORDER — PRAVASTATIN SODIUM 20 MG PO TABS
20.0000 mg | ORAL_TABLET | Freq: Every day | ORAL | Status: DC
  Administered 2018-07-09: 20 mg via ORAL
  Filled 2018-07-09: qty 2
  Filled 2018-07-09 (×2): qty 1

## 2018-07-09 NOTE — Consult Note (Signed)
Physical Medicine and Rehabilitation Consult Reason for Consult: Right-sided weakness and aphasia Referring Physician: Dr.Xu   HPI: Kaitlyn Underwood is a 77 y.o.right handed female with history of hypertension, hyperlipidemia, chronic systolic congestive heart failure, COPD, CAD maintained on aspirin. Patient reportedly lives with spouse.[facility] 07/06/2018 with right-sided weakness and aphasia. CT/CTA outside hospital showed occlusion of a substantial vessel in the left MCA distribution. Patient didn't receive TPA and was transferred to [facility] for further evaluation. MRI the brain showed small to moderate evolving acute left MCA territory infarction with associated petechial hemorrhage without frank hemorrhagic transformation. Negative MRA. Underwent revascularization thrombectomy per interventional radiology of left M2 occlusion. EEG negative for seizure. Echocardiogram with ejection fraction of 30% diffuse hypokinesis. Dysphagia #1 pudding thick liquid diet. Currently maintained on Eliquis for CVA prophylaxis. Therapy evaluations completed with recommendations of physical medicine rehabilitation consult.   Review of Systems  Unable to perform ROS: Language   Past Medical History:  Diagnosis Date  . Anxiety   . Atrial fibrillation ([place])   . Chronic systolic heart failure ([place])   . COPD (chronic obstructive pulmonary disease) ([place])   . Coronary artery disease   . Hypertension   . Hypothyroidism   . Left renal mass    unconfirmed documentation of "kidney cancer", possibly cyst  . Pre-diabetes    Past Surgical History:  Procedure Laterality Date  . IR CT HEAD LTD  07/06/2018  . IR PERCUTANEOUS ART THROMBECTOMY/INFUSION INTRACRANIAL INC DIAG ANGIO  07/06/2018  . LEFT HEART CATH AND CORONARY ANGIOGRAPHY N/A 04/29/2017   Procedure: LEFT HEART CATH AND CORONARY ANGIOGRAPHY;  Surgeon: Belva Crome, MD;  Location: [place] [facility];  Service:  Cardiovascular;  Laterality: N/A;  . RADIOLOGY WITH ANESTHESIA N/A 07/05/2018   Procedure: RADIOLOGY WITH ANESTHESIA;  Surgeon: Luanne Bras, MD;  Location: [place];  Service: Radiology;  Laterality: N/A;  . ULTRASOUND GUIDANCE FOR VASCULAR ACCESS  04/29/2017   Procedure: Ultrasound Guidance For Vascular Access;  Surgeon: Belva Crome, MD;  Location: [place] [facility];  Service: Cardiovascular;;   Family History  Problem Relation Age of Onset  . Arrhythmia Sister   . Heart disease Brother   . Heart attack Maternal Uncle    Social History:  reports that she quit smoking about 28 years ago. Her smoking use included cigarettes. She has a 20.00 pack-year smoking history. She has never used smokeless tobacco. She reports that she does not drink alcohol or use drugs. Allergies:  Allergies  Allergen Reactions  . Sulfa Antibiotics Anaphylaxis    tongue   Medications Prior to Admission  Medication Sig Dispense Refill  . albuterol (PROVENTIL HFA;VENTOLIN HFA) 108 (90 Base) MCG/ACT inhaler Inhale 2 puffs into the lungs every 6 (six) hours as needed for wheezing or shortness of breath.     . Ascorbic Acid (VITAMIN C) 1000 MG tablet Take 1,000 mg by mouth 2 (two) times daily.    Marland Kitchen aspirin EC 325 MG tablet Take 1 tablet (325 mg total) by mouth daily. 30 tablet 0  . budesonide (PULMICORT) 0.5 MG/2ML nebulizer solution Take 0.5 mg by nebulization daily.     . captopril (CAPOTEN) 50 MG tablet TAKE 1 TABLET BY MOUTH THREE TIMES DAILY (Patient taking differently: Take 50 mg by mouth 3 (three) times daily. ) 270 tablet 1  . digoxin (LANOXIN) 0.125 MG tablet TAKE 1 TABLET BY MOUTH ONCE DAILY (Patient taking differently: Take 0.125 mg by mouth daily. )  90 tablet 1  . febuxostat (ULORIC) 40 MG tablet Take 40 mg by mouth daily.  12  . furosemide (LASIX) 40 MG tablet Take 40 mg by mouth daily.    Marland Kitchen gabapentin (NEURONTIN) 300 MG capsule Take 300 mg by mouth 3 (three) times daily.  2  . Glucosamine Sulfate  500 MG TABS Take 500 mg by mouth daily.    Marland Kitchen ipratropium-albuterol (DUONEB) 0.5-2.5 (3) MG/3ML SOLN Take 3 mLs by nebulization every 6 (six) hours as needed (shortness of breath/wheezing.).     Marland Kitchen metoprolol succinate (TOPROL-XL) 50 MG 24 hr tablet Take 1 tablet (50 mg total) by mouth daily. 90 tablet 0  . montelukast (SINGULAIR) 10 MG tablet Take 10 mg by mouth daily.    . nitroGLYCERIN (NITROSTAT) 0.4 MG SL tablet Place 0.4 mg under the tongue every 5 (five) minutes as needed for chest pain.     . pantoprazole (PROTONIX) 40 MG tablet Take 40 mg by mouth daily.    . traMADol (ULTRAM) 50 MG tablet Take 50 mg by mouth daily as needed for moderate pain.     . vitamin E 400 UNIT capsule Take 400 Units by mouth daily.    . captopril (CAPOTEN) 50 MG tablet Take 1 tablet (50 mg total) by mouth 3 (three) times daily. (Patient not taking: Reported on 07/06/2018) 270 tablet 3    Home: Home Living Family/patient expects to be discharged to:: Private residence Available Help at Discharge: (UTA) Type of Home: (UTA) Additional Comments: Pt unable to report due to severe expressive aphasia.    Lives With: (UTA)  Functional History: Prior Function Comments: assumed independent, chart did not reveal any prior therapy, no family present to report.  Functional Status:  Mobility: Bed Mobility Overal bed mobility: Needs Assistance Bed Mobility: Supine to Sit Supine to sit: Mod assist, HOB elevated General bed mobility comments: see PT note Transfers Overall transfer level: Needs assistance Equipment used: 2 person hand held assist Transfers: Sit to/from Stand, Stand Pivot Transfers Sit to Stand: +2 physical assistance, Mod assist Stand pivot transfers: +2 physical assistance, Mod assist General transfer comment: mod assist +2 for transfers to L (strong side) to [facility] then recliner, cueing for sequencing, safety and posture Ambulation/Gait General Gait Details: unable to safely at this time.      ADL: ADL Overall ADL's : Needs assistance/impaired Grooming: Minimal assistance, Sitting Upper Body Bathing: Moderate assistance, Sitting Lower Body Bathing: Maximal assistance, +2 for physical assistance, Sit to/from stand Upper Body Dressing : Moderate assistance, Sitting Lower Body Dressing: +2 for physical assistance, Sit to/from stand, Total assistance Toilet Transfer: Moderate assistance, +2 for physical assistance, BSC, Stand-pivot, Cueing for safety, Cueing for sequencing Toileting- Clothing Manipulation and Hygiene: Total assistance, +2 for physical assistance, Sit to/from stand Functional mobility during ADLs: Moderate assistance, +2 for physical assistance, Cueing for safety, Cueing for sequencing General ADL Comments: limited by aphasia, cognition, R sided weakness, and impaired balance   Cognition: Cognition Overall Cognitive Status: Difficult to assess Arousal/Alertness: Awake/alert Orientation Level: Oriented to person, Disoriented to place, Disoriented to time, Disoriented to situation Attention: Focused, Sustained Focused Attention: Appears intact Sustained Attention: Appears intact Memory: (further assess) Awareness: (further assess) Problem Solving: (further assess) Executive Function: Initiating Initiating: Impaired(able to initiate suctioning) Initiating Impairment: Verbal basic Behaviors: Lability Safety/Judgment: Impaired Cognition Arousal/Alertness: Awake/alert Behavior During Therapy: WFL for tasks assessed/performed Overall Cognitive Status: Difficult to assess Area of Impairment: Attention, Memory, Following commands, Safety/judgement, Awareness, Problem solving Current Attention Level: Sustained Memory:  Decreased recall of precautions Following Commands: Follows one step commands with increased time Safety/Judgement: Decreased awareness of safety, Decreased awareness of deficits Awareness: Intellectual Problem Solving: Decreased initiation,  Difficulty sequencing, Requires verbal cues, Requires tactile cues General Comments: approx 75% accuracy to yes/no questions, difficulty sequencing tasks  Blood pressure (!) 145/75, pulse 60, temperature 98.1 F (36.7 C), temperature source Axillary, resp. rate 12, height 5\' 2"  (1.575 m), weight 90.7 kg, SpO2 98 %. Physical Exam  Vitals reviewed. Constitutional:  obese  HENT:  Head: Normocephalic.  Eyes: Pupils are equal, round, and reactive to light.  Neck: Normal range of motion.  Cardiovascular: Normal rate.  Respiratory: Effort normal.  GI: Soft.  Musculoskeletal: She exhibits edema.  Neurological:  Patient is alert and makes eye contact with examiner. She is expressively aphasic. She did follow one step verbal commands easily. She was able to utter a few simple words spontaneously. Answered yes/no questions about 75% for simple information. RUE 3/5 prox to distal. RLE 3/5. LUE and LLE 4+ to 5/5. No focal sensory findings, sensed pain in all 4's.  Skin:  Multiple ecchymoses on arms  Psychiatric:  Pleasant and cooperative    Results for orders placed or performed during the hospital encounter of 07/05/18 (from the past 24 hour(s))  Glucose, capillary     Status: None   Collection Time: 07/08/18  8:27 AM  Result Value Ref Range   Glucose-Capillary 74 70 - 99 mg/dL  Glucose, capillary     Status: Abnormal   Collection Time: 07/08/18  3:23 PM  Result Value Ref Range   Glucose-Capillary 115 (H) 70 - 99 mg/dL   Comment 1 Notify RN    Comment 2 Document in Chart   Glucose, capillary     Status: Abnormal   Collection Time: 07/08/18  7:35 PM  Result Value Ref Range   Glucose-Capillary 108 (H) 70 - 99 mg/dL  Glucose, capillary     Status: None   Collection Time: 07/08/18 11:53 PM  Result Value Ref Range   Glucose-Capillary 90 70 - 99 mg/dL  Glucose, capillary     Status: Abnormal   Collection Time: 07/09/18  3:44 AM  Result Value Ref Range   Glucose-Capillary 101 (H) 70 - 99  mg/dL   Dg Chest Port 1 View  Result Date: 07/08/2018 CLINICAL DATA:  Congestive heart failure. EXAM: PORTABLE CHEST 1 VIEW COMPARISON:  Radiograph of July 07, 2018. FINDINGS: Stable cardiomegaly. No pneumothorax or pleural effusion is noted. Both lungs are clear. The visualized skeletal structures are unremarkable. IMPRESSION: Stable cardiomegaly.  No acute abnormality seen. Electronically Signed   By: Marijo Conception, M.D.   On: 07/08/2018 10:55   Dg Chest Port 1 View  Result Date: 07/07/2018 CLINICAL DATA:  CHF. Hx of afib, chronic systolic heart failure, COPD, coronary artery disease, hypertension, pre-diabetes, left heart cath and coronary angiography(04/29/17). Former [phone]). EXAM: PORTABLE CHEST 1 VIEW COMPARISON:  07/06/2018 FINDINGS: Moderate enlargement of the cardiopericardial silhouette no overt edema. Atherosclerotic calcification of the aortic arch. The lungs appear otherwise clear. Degenerative glenohumeral spurring. IMPRESSION: 1. Moderate enlargement of the cardiopericardial silhouette, without edema. 2. Degenerative glenohumeral arthropathy bilaterally. Electronically Signed   By: Van Clines M.D.   On: 07/07/2018 11:48   Dg Swallowing Func-speech Pathology  Result Date: 07/08/2018 Objective Swallowing Evaluation: Type of Study: MBS-Modified Barium Swallow Study  Patient Details Name: PRESLEI BLAKLEY MRN: [id number] Date of Birth: 11-24-1940 Today's Date: 07/08/2018 Time: SLP Start Time (ACUTE ONLY): 1829 -SLP  Stop Time (ACUTE ONLY): 1217 SLP Time Calculation (min) (ACUTE ONLY): 18 min Past Medical History: Past Medical History: Diagnosis Date . Anxiety  . Atrial fibrillation ([place])  . Chronic systolic heart failure ([place])  . COPD (chronic obstructive pulmonary disease) ([place])  . Coronary artery disease  . Hypertension  . Hypothyroidism  . Left renal mass   unconfirmed documentation of "kidney cancer", possibly cyst . Pre-diabetes  Past Surgical History: Past Surgical History:  Procedure Laterality Date . IR CT HEAD LTD  07/06/2018 . IR PERCUTANEOUS ART THROMBECTOMY/INFUSION INTRACRANIAL INC DIAG ANGIO  07/06/2018 . LEFT HEART CATH AND CORONARY ANGIOGRAPHY N/A 04/29/2017  Procedure: LEFT HEART CATH AND CORONARY ANGIOGRAPHY;  Surgeon: Belva Crome, MD;  Location: [place] [facility];  Service: Cardiovascular;  Laterality: N/A; . RADIOLOGY WITH ANESTHESIA N/A 07/05/2018  Procedure: RADIOLOGY WITH ANESTHESIA;  Surgeon: Luanne Bras, MD;  Location: [facility];  Service: Radiology;  Laterality: N/A; . ULTRASOUND GUIDANCE FOR VASCULAR ACCESS  04/29/2017  Procedure: Ultrasound Guidance For Vascular Access;  Surgeon: Belva Crome, MD;  Location: [place] [facility];  Service: Cardiovascular;; HPI: TIMIKO OFFUTT is a 77 y.o. female with a history of HTN, hyperlipidemia, COPD, afib who presented with R weakness and aphasia. CTA showed occluded distal LT MCA - M2, had TPA and emergent thrombectomy at [facility]. ETT 11/17-11/19. Per MD note, pt has mild wheezing and secretions. CXR moderate enlargement of the cardiopericardial silhouette, without edema and degenerative glenohumeral arthropathy bilaterally. 11/18 MRI small to moderate evolving acute L MCA territory infarct, associated petechial hemorrhage without frank hemorrhagic transformation.  No data recorded Assessment / Plan / Recommendation CHL IP CLINICAL IMPRESSIONS 07/08/2018 Clinical Impression Pt presents with mod-servere oropharyngeal dysphagia marked by oral holding, reduced posterior propulsion, lingual and valleculuar residue, as well as penetration and aspiration of nectar and honey thick liquids. Pt's sensation of penetrates/aspirates was significantly delayed, with a reflexive cough only triggering after multiple instances of aspiration. Lingual and vallecular residue from nectar and honey were consistently aspirated after the swallow and delayed arytenoid excursion to epiglottic petiole preventing complete laryngeal vestibule closure  resulted in penetration of nectar and honey during the swallow. Pt's airway protection remained in tact during trials of puree, however reduced posterior propulsion of boluses and subsequent oral residue require pt to perform additional dry swallow for complete oral transit and clearance without pharyngeal residue. Recommend pt initiate puree diet, pudding thick liquids, meds crushed in puree with FULL supervision and assistance with feeding to ensure small bites/sips and verbal reminder for second swallow after each bite/sip. ST will continue to follow to provide treatment with diet safety, efficiency, compensatory strategy training, and potential for diet/liquid advancement. SLP Visit Diagnosis Dysphagia, oropharyngeal phase (R13.12) Attention and concentration deficit following -- Frontal lobe and executive function deficit following -- Impact on safety and function Severe aspiration risk   CHL IP TREATMENT RECOMMENDATION 07/08/2018 Treatment Recommendations Therapy as outlined in treatment plan below   Prognosis 07/08/2018 Prognosis for Safe Diet Advancement Fair Barriers to Reach Goals Language deficits;Severity of deficits Barriers/Prognosis Comment -- CHL IP DIET RECOMMENDATION 07/08/2018 SLP Diet Recommendations Dysphagia 1 (Puree) solids;Pudding thick liquid Liquid Administration via Spoon Medication Administration Crushed with puree Compensations Slow rate;Small sips/bites;Clear throat intermittently;Multiple dry swallows after each bite/sip;Minimize environmental distractions Postural Changes Seated upright at 90 degrees   CHL IP OTHER RECOMMENDATIONS 07/08/2018 Recommended Consults -- Oral Care Recommendations Oral care BID Other Recommendations Order thickener from pharmacy;Remove water pitcher;Have oral suction available   CHL IP  FOLLOW UP RECOMMENDATIONS 07/08/2018 Follow up Recommendations Skilled Nursing facility   [facility] IP FREQUENCY AND DURATION 07/08/2018 Speech Therapy Frequency (ACUTE ONLY) min  2x/week Treatment Duration 2 weeks      CHL IP ORAL PHASE 07/08/2018 Oral Phase Impaired Oral - Pudding Teaspoon -- Oral - Pudding Cup -- Oral - Honey Teaspoon -- Oral - Honey Cup Lingual pumping;Lingual/palatal residue;Delayed oral transit;Holding of bolus Oral - Nectar Teaspoon -- Oral - Nectar Cup Right anterior bolus loss;Holding of bolus;Lingual/palatal residue;Delayed oral transit Oral - Nectar Straw -- Oral - Thin Teaspoon -- Oral - Thin Cup -- Oral - Thin Straw -- Oral - Puree Piecemeal swallowing;Weak lingual manipulation;Lingual pumping;Delayed oral transit;Reduced posterior propulsion;Holding of bolus Oral - Mech Soft -- Oral - Regular -- Oral - Multi-Consistency -- Oral - Pill -- Oral Phase - Comment --  CHL IP PHARYNGEAL PHASE 07/08/2018 Pharyngeal Phase Impaired Pharyngeal- Pudding Teaspoon -- Pharyngeal -- Pharyngeal- Pudding Cup -- Pharyngeal -- Pharyngeal- Honey Teaspoon -- Pharyngeal -- Pharyngeal- Honey Cup Penetration/Aspiration during swallow;Penetration/Apiration after swallow;Pharyngeal residue - valleculae;Pharyngeal residue - posterior pharnyx;Delayed swallow initiation-pyriform sinuses Pharyngeal Material enters airway, passes BELOW cords without attempt by patient to eject out (silent aspiration);Material enters airway, CONTACTS cords and not ejected out Pharyngeal- Nectar Teaspoon -- Pharyngeal -- Pharyngeal- Nectar Cup Penetration/Apiration after swallow;Pharyngeal residue - valleculae;Penetration/Aspiration during swallow;Reduced epiglottic inversion Pharyngeal Material enters airway, CONTACTS cords and not ejected out;Material enters airway, passes BELOW cords then ejected out Pharyngeal- Nectar Straw -- Pharyngeal -- Pharyngeal- Thin Teaspoon -- Pharyngeal -- Pharyngeal- Thin Cup -- Pharyngeal -- Pharyngeal- Thin Straw -- Pharyngeal -- Pharyngeal- Puree WFL Pharyngeal -- Pharyngeal- Mechanical Soft -- Pharyngeal -- Pharyngeal- Regular -- Pharyngeal -- Pharyngeal- Multi-consistency --  Pharyngeal -- Pharyngeal- Pill -- Pharyngeal -- Pharyngeal Comment --  CHL IP CERVICAL ESOPHAGEAL PHASE 07/08/2018 Cervical Esophageal Phase WFL Pudding Teaspoon -- Pudding Cup -- Honey Teaspoon -- Honey Cup -- Nectar Teaspoon -- Nectar Cup -- Nectar Straw -- Thin Teaspoon -- Thin Cup -- Thin Straw -- Puree -- Mechanical Soft -- Regular -- Multi-consistency -- Pill -- Cervical Esophageal Comment -- Houston Siren 07/08/2018, 3:49 PM Orbie Pyo Litaker M.Ed Actor Pager [phone] Office [phone]                Assessment/Plan: Diagnosis: left MCA infarct with right hemiparesis 1. Does the need for close, 24 hr/day medical supervision in concert with the patient's rehab needs make it unreasonable for this patient to be served in a less intensive setting? Yes 2. Co-Morbidities requiring supervision/potential complications: HTN, chf, copd, cad 3. Due to bladder management, bowel management, safety, skin/wound care, disease management, medication administration, pain management and patient education, does the patient require 24 hr/day rehab nursing? Yes 4. Does the patient require coordinated care of a physician, rehab nurse, PT (1-2 hrs/day, 5 days/week), OT (1-2 hrs/day, 5 days/week) and SLP (1-2 hrs/day, 5 days/week) to address physical and functional deficits in the context of the above medical diagnosis(es)? Yes Addressing deficits in the following areas: balance, endurance, locomotion, strength, transferring, bowel/bladder control, bathing, dressing, feeding, grooming, toileting, cognition, language, swallowing and psychosocial support 5. Can the patient actively participate in an intensive therapy program of at least 3 hrs of therapy per day at least 5 days per week? Yes 6. The potential for patient to make measurable gains while on inpatient rehab is excellent 7. Anticipated functional outcomes upon discharge from inpatient rehab are min assist  with PT, min assist  with OT, min assist with  SLP. 8. Estimated rehab length of stay to reach the above functional goals is: 18-25 days 9. Anticipated D/C setting: Home 10. Anticipated post D/C treatments: HH therapy and Outpatient therapy 11. Overall Rehab/Functional Prognosis: excellent  RECOMMENDATIONS: This patient's condition is appropriate for continued rehabilitative care in the following setting: CIR Patient has agreed to participate in recommended program. Yes Note that insurance prior authorization may be required for reimbursement for recommended care.  Comment: Rehab Admissions Coordinator to follow up.  Thanks,  Meredith Staggers, MD, Mellody Drown  I have personally performed a face to face diagnostic evaluation of this patient. Additionally, I have reviewed and concur with the physician assistant's documentation above.    Lavon Paganini Angiulli, PA-C 07/09/2018

## 2018-07-09 NOTE — Progress Notes (Signed)
  Speech Language Pathology Treatment: Dysphagia;Cognitive-Linquistic  Patient Details Name: Kaitlyn Underwood MRN: [id number] DOB: [birth year] Today's Date: 07/09/2018 Time: 7048-8891 SLP Time Calculation (min) (ACUTE ONLY): 23 min  Assessment / Plan / Recommendation Clinical Impression  Pt was alert and pleasant throughout dysphagia and aphasia treatment session today. She still exhibits severe expressive aphasia, however significant improvements noted in pt's ability to produce automatic speech (counting, singing happy birthday, "Rainn"). Pt also produced intelligible approximations of words and short phrases to comunicate her wants/needs when given models, phonemic, and sentence completion cues. Pt also expressed intellectual awareness, acknowledging that she had a stroke and seeking more details regarding meaning of "ischemic" stoke.  During dysphagia intervention, pt accepted teaspoons of pudding thick liquid without overt s/s aspiration or oral phase impairment. She continues to require assistance for feeding and mod verbal cues to perform recommended strategies. SLP provided pt's daughter thorough explanation of pt's MBSS results and education/training regarding proper use of thickener to achieve pudding thick consistency liquids. Recommend continue dysphagia 1 (puree) diet, pudding thick liquids, take small bites/sips, swallow twice after each bite to reduce pharyngeal residue, and intermittent cough throughout PO intake.  ST will continue to follow pt to provide skilled dysphagia and aphasia interventions while in the acute setting.    HPI HPI: Kaitlyn Underwood is a 77 y.o. female with a history of HTN, hyperlipidemia, COPD, afib who presented with R weakness and aphasia. CTA showed occluded distal LT MCA - M2, had TPA and emergent thrombectomy at [facility]. ETT 11/17-11/19. Per MD note, pt has mild wheezing and secretions. CXR moderate enlargement of the cardiopericardial silhouette, without edema and  degenerative glenohumeral arthropathy bilaterally. 11/18 MRI small to moderate evolving acute L MCA territory infarct, associated petechial hemorrhage without frank hemorrhagic transformation.       SLP Plan  Continue with current plan of care       Recommendations  Diet recommendations: Dysphagia 1 (puree);Pudding-thick liquid Liquids provided via: Teaspoon;Cup Medication Administration: Crushed with puree Supervision: Staff to assist with self feeding;Full supervision/cueing for compensatory strategies Compensations: Slow rate;Small sips/bites;Clear throat intermittently;Multiple dry swallows after each bite/sip;Minimize environmental distractions Postural Changes and/or Swallow Maneuvers: Seated upright 90 degrees                Oral Care Recommendations: Oral care BID Follow up Recommendations: Inpatient Rehab SLP Visit Diagnosis: Dysphagia, unspecified (R13.10);Aphasia (R47.01) Plan: Continue with current plan of care       Jettie Booze, Student SLP                 Jettie Booze 07/09/2018, 3:56 PM

## 2018-07-09 NOTE — Progress Notes (Signed)
Physical Therapy Treatment Patient Details Name: Kaitlyn Underwood MRN: [id number] DOB: 1941/03/07 Today's Date: 07/09/2018    History of Present Illness 77 y.o. female admitted on 07/05/18 for R sided weakness and aphasia.  Pt treated for possible stroke after CT was negative for hemorrhage with IV tPA and underwent Lt common carotid arteriogram floowed by complete revascularization of of occluded Lt MCA.  MRI revealed L MCA infarct and associated petechial hemmorrhage.  Pt with significant PMH of pre-diabetes, HRN, CAD, COPD, chronic systolic heart failure, and A-fib.                                                                                          PT Comments    Patient progressing with mobility this session able to use walker as she did prior to this episode to stand step to recliner.  She demonstrated improved sitting balance over last session and though impulsive at first to get to chair improved with cues and encouragement.  Sat EOB about 5 minutes for hair combing with S and cues for balance and stability.  Will benefit from CIR level rehab upon d/c.    Follow Up Recommendations  CIR     Equipment Recommendations  Wheelchair (measurements PT);Wheelchair cushion (measurements PT);3in1 (PT)    Recommendations for Other Services       Precautions / Restrictions Precautions Precautions: Fall Precaution Comments: right sided weakness left gaze preference, decreased R side awareness    Mobility  Bed Mobility Overal bed mobility: Needs Assistance       Supine to sit: Mod assist;HOB elevated     General bed mobility comments: increased time, assist for R LE and to use of pad to scoot hips as she scooted them  Transfers Overall transfer level: Needs assistance Equipment used: Rolling walker (2 wheeled) Transfers: Sit to/from [facility] Sit to Stand: Mod assist;+2 physical assistance Stand pivot transfers: Mod assist;+2 physical assistance       General  transfer comment: assist to rise from bed, to place R hand on walker and for stepping to recliner with RW cues for posture, safety, attention to R LE  Ambulation/Gait                 Stairs             Wheelchair Mobility    Modified Rankin (Stroke Patients Only) Modified Rankin (Stroke Patients Only) Pre-Morbid Rankin Score: No symptoms Modified Rankin: Severe disability     Balance Overall balance assessment: Needs assistance Sitting-balance support: Feet unsupported Sitting balance-Leahy Scale: Poor Sitting balance - Comments:  close S and cues due to R lateral lean, anterior lean   Standing balance support: Bilateral upper extremity supported Standing balance-Leahy Scale: Poor Standing balance comment: reliant on external support                            Cognition Arousal/Alertness: Awake/alert Behavior During Therapy: WFL for tasks assessed/performed Overall Cognitive Status: Impaired/Different from baseline Area of Impairment: Attention;Safety/judgement;Problem solving  Current Attention Level: Sustained     Safety/Judgement: Decreased awareness of safety;Decreased awareness of deficits Awareness: Intellectual Problem Solving: Decreased initiation;Requires verbal cues;Requires tactile cues General Comments: some apraxia possibly noted with difficulty performing shoulder shrug with verbal cues and demonstration and increased time      Exercises General Exercises - Upper Extremity Shoulder Flexion: AAROM;AROM;5 reps;Both;Supine General Exercises - Lower Extremity Ankle Circles/Pumps: AROM;Both;Supine;10 reps Heel Slides: AROM;AAROM;5 reps;Both;Supine    General Comments General comments (skin integrity, edema, etc.): daughter in room and requesting help to get a game for pt to pass the time; educated need to confer with SLP to ensure game or activity will not frustrate patient.       Pertinent Vitals/Pain  Faces Pain Scale: No hurt    Home Living                      Prior Function            PT Goals (current goals can now be found in the care plan section) Progress towards PT goals: Progressing toward goals    Frequency    Min 4X/week      PT Plan Current plan remains appropriate    Co-evaluation              AM-PAC PT "6 Clicks" Daily Activity  Outcome Measure  Difficulty turning over in bed (including adjusting bedclothes, sheets and blankets)?: Unable Difficulty moving from lying on back to sitting on the side of the bed? : Unable Difficulty sitting down on and standing up from a chair with arms (e.g., wheelchair, bedside commode, etc,.)?: Unable Help needed moving to and from a bed to chair (including a wheelchair)?: A Lot Help needed walking in hospital room?: Total Help needed climbing 3-5 steps with a railing? : Total 6 Click Score: 7    End of Session Equipment Utilized During Treatment: Gait belt Activity Tolerance: Patient limited by fatigue Patient left: in chair;with chair alarm set;with family/visitor present Nurse Communication: Mobility status PT Visit Diagnosis: Muscle weakness (generalized) (M62.81);Difficulty in walking, not elsewhere classified (R26.2);Hemiplegia and hemiparesis Hemiplegia - Right/Left: Right Hemiplegia - dominant/non-dominant: Dominant Hemiplegia - caused by: Cerebral infarction     Time: 1165-7903 PT Time Calculation (min) (ACUTE ONLY): 24 min  Charges:  $Therapeutic Activity: 23-37 mins                     Magda Kiel, [place] [facility] [phone] 07/09/2018    Kaitlyn Underwood 07/09/2018, 1:50 PM

## 2018-07-09 NOTE — Progress Notes (Signed)
STROKE TEAM PROGRESS NOTE   SUBJECTIVE (INTERVAL HISTORY) Her daughter is at the bedside. Pt sitting in bed, comfortably. No SOB or distress. Overnight afebrile, no acute event overnight. Passed swallow and now on pudding thick liquid. Still has aphasia, right facial droop but right UE and LE weakness much improved.    OBJECTIVE Temp:  [98.1 F (36.7 C)-99.2 F (37.3 C)] 98.2 F (36.8 C) (11/21 0800) Pulse Rate:  [57-97] 57 (11/21 1100) Cardiac Rhythm: Atrial fibrillation (11/21 0800) Resp:  [2-29] 21 (11/21 1100) BP: (99-159)/(46-142) 147/75 (11/21 0800) SpO2:  [94 %-100 %] 98 % (11/21 1100)  Recent Labs  Lab 07/08/18 1935 07/08/18 2353 07/09/18 0344 07/09/18 0814 07/09/18 1132  GLUCAP 108* 90 101* 88 107*   Recent Labs  Lab 07/06/18 0703 07/07/18 1140 07/08/18 0508 07/09/18 0524  NA 140 142 143 143  K 3.8 4.2 3.5 4.1  CL 111 114* 111 113*  CO2 24 20* 24 22  GLUCOSE 101* 103* 90 101*  BUN 9 9 10 15   CREATININE 0.89 0.92 0.94 0.96  CALCIUM 8.7* 9.0 9.3 9.7  MG 1.8  --   --   --   PHOS 3.7  --   --   --    No results for input(s): AST, ALT, ALKPHOS, BILITOT, PROT, ALBUMIN in the last 168 hours. Recent Labs  Lab 07/06/18 0703 07/07/18 1140 07/08/18 0508 07/09/18 0524  WBC 9.9 12.6* 9.7 8.8  NEUTROABS 7.0  --   --   --   HGB 11.3* 10.8* 10.6* 10.4*  HCT 34.8* 34.4* 32.8* 33.6*  MCV 94.6 97.5 96.2 97.7  PLT 297 284 275 320   No results for input(s): CKTOTAL, CKMB, CKMBINDEX, TROPONINI in the last 168 hours. No results for input(s): LABPROT, INR in the last 72 hours. No results for input(s): COLORURINE, LABSPEC, [place], GLUCOSEU, HGBUR, BILIRUBINUR, KETONESUR, PROTEINUR, UROBILINOGEN, NITRITE, LEUKOCYTESUR in the last 72 hours.  Invalid input(s): APPERANCEUR     Component Value Date/Time   CHOL 146 07/06/2018 0704   CHOL 189 05/12/2018 1212   TRIG 124 07/06/2018 0704   HDL 49 07/06/2018 0704   HDL 63 05/12/2018 1212   CHOLHDL 3.0 07/06/2018 0704    VLDL 25 07/06/2018 0704   LDLCALC 72 07/06/2018 0704   LDLCALC 103 (H) 05/12/2018 1212   Lab Results  Component Value Date   HGBA1C 5.6 07/06/2018   No results found for: LABOPIA, COCAINSCRNUR, LABBENZ, AMPHETMU, THCU, LABBARB  No results for input(s): ETH in the last 168 hours.  I have personally reviewed the radiological images below and agree with the radiology interpretations.  [name] GY Contrast  Result Date: 07/06/2018 CLINICAL DATA:  Follow-up examination for acute stroke. 77 year old female who presented with aphasia and right-sided weakness, status post tPA and catheter directed thrombectomy. EXAM: MRI HEAD WITHOUT CONTRAST MRA HEAD WITHOUT CONTRAST TECHNIQUE: Multiplanar, multiecho pulse sequences of the brain and surrounding structures were obtained without intravenous contrast. Angiographic images of the head were obtained using MRA technique without contrast. COMPARISON:  Prior CT and arteriogram from 07/05/2018. FINDINGS: MRI HEAD FINDINGS Brain: Generalized age-related cerebral atrophy. Patchy and confluent T2/FLAIR hyperintensity within the periventricular and deep white matter both cerebral hemispheres, most consistent with chronic small vessel ischemic disease, moderate in nature. Patchy small moderate volume restricted diffusion seen involving the left insula and overlying left frontal operculum, extending to involve the left periventricular white matter, consistent with acute left MCA territory infarct (series 3, image 26). Associated mild localized gyral  swelling and edema without significant mass effect. Patchy susceptibility artifact within the area of infarction consistent with associated petechial hemorrhage (series 6, image 54). No frank hemorrhagic transformation by MRI. No other evidence for acute or subacute ischemia. Gray-white matter differentiation otherwise maintained. Few small chronic micro hemorrhages noted involving the temporal lobes bilaterally. Probable 12  mm densely calcified meningioma overlies the anterior left frontal convexity. No associated edema or mass effect. No other mass lesion. No midline shift or mass effect. No hydrocephalus. Pituitary gland within normal limits. Vascular: Major intracranial vascular flow voids maintained. Skull and upper cervical spine: Craniocervical junction within normal limits. Bone marrow signal intensity normal. Mild soft tissue swelling noted at the left frontoparietal scalp near the vertex (series 11, image 12). Sinuses/Orbits: Globes and orbital soft tissues within normal limits. Paranasal sinuses are clear. Small left mastoid effusion, of doubtful significance. Inner ear structures grossly normal. Other: None. MRA HEAD FINDINGS ANTERIOR CIRCULATION: Distal cervical segments of the internal carotid arteries are widely patent with antegrade flow. Petrous, cavernous, and supraclinoid segments widely patent without stenosis. Origin of the ophthalmic arteries patent bilaterally. ICA termini well perfused and symmetric. A1 segments patent bilaterally. Normal anterior communicating artery. Anterior cerebral arteries widely patent to their distal aspects without stenosis. Right M1 widely patent without stenosis. Normal right MCA bifurcation. No proximal right M2 occlusion. Distal right MCA branches well perfused. Left M1 duplicated and/or bifurcates early at the origin. No M1 stenosis or occlusion. No proximal left M2 occlusion. Distal left MCA branches well perfused. POSTERIOR CIRCULATION: Vertebral arteries patent to the vertebrobasilar junction without stenosis. Right vertebral artery dominant. Posterior inferior cerebral arteries not visualized. Basilar widely patent to its distal aspect without stenosis. Superior cerebral arteries patent bilaterally. Posterior cerebral arteries well perfused to their distal aspects without stenosis. Prominent left posterior communicating artery noted. IMPRESSION: MRI HEAD IMPRESSION: 1. Small to  moderate evolving acute left MCA territory infarct as above. Associated petechial hemorrhage without frank hemorrhagic transformation or significant mass effect. 2. Underlying moderate chronic microvascular ischemic disease. MRA HEAD IMPRESSION: 1. Negative intracranial MRA. No evidence for large vessel occlusion status post catheter directed thrombectomy. No hemodynamically significant or correctable stenosis identified. 2. Duplicated left M1 segment. Electronically Signed   By: Jeannine Boga M.D.   On: 07/06/2018 19:43   Mr Brain Wo Contrast  Result Date: 07/06/2018 CLINICAL DATA:  Follow-up examination for acute stroke. 77 year old female who presented with aphasia and right-sided weakness, status post tPA and catheter directed thrombectomy. EXAM: MRI HEAD WITHOUT CONTRAST MRA HEAD WITHOUT CONTRAST TECHNIQUE: Multiplanar, multiecho pulse sequences of the brain and surrounding structures were obtained without intravenous contrast. Angiographic images of the head were obtained using MRA technique without contrast. COMPARISON:  Prior CT and arteriogram from 07/05/2018. FINDINGS: MRI HEAD FINDINGS Brain: Generalized age-related cerebral atrophy. Patchy and confluent T2/FLAIR hyperintensity within the periventricular and deep white matter both cerebral hemispheres, most consistent with chronic small vessel ischemic disease, moderate in nature. Patchy small moderate volume restricted diffusion seen involving the left insula and overlying left frontal operculum, extending to involve the left periventricular white matter, consistent with acute left MCA territory infarct (series 3, image 26). Associated mild localized gyral swelling and edema without significant mass effect. Patchy susceptibility artifact within the area of infarction consistent with associated petechial hemorrhage (series 6, image 54). No frank hemorrhagic transformation by MRI. No other evidence for acute or subacute ischemia. Gray-white  matter differentiation otherwise maintained. Few small chronic micro hemorrhages noted involving the temporal lobes  bilaterally. Probable 12 mm densely calcified meningioma overlies the anterior left frontal convexity. No associated edema or mass effect. No other mass lesion. No midline shift or mass effect. No hydrocephalus. Pituitary gland within normal limits. Vascular: Major intracranial vascular flow voids maintained. Skull and upper cervical spine: Craniocervical junction within normal limits. Bone marrow signal intensity normal. Mild soft tissue swelling noted at the left frontoparietal scalp near the vertex (series 11, image 12). Sinuses/Orbits: Globes and orbital soft tissues within normal limits. Paranasal sinuses are clear. Small left mastoid effusion, of doubtful significance. Inner ear structures grossly normal. Other: None. MRA HEAD FINDINGS ANTERIOR CIRCULATION: Distal cervical segments of the internal carotid arteries are widely patent with antegrade flow. Petrous, cavernous, and supraclinoid segments widely patent without stenosis. Origin of the ophthalmic arteries patent bilaterally. ICA termini well perfused and symmetric. A1 segments patent bilaterally. Normal anterior communicating artery. Anterior cerebral arteries widely patent to their distal aspects without stenosis. Right M1 widely patent without stenosis. Normal right MCA bifurcation. No proximal right M2 occlusion. Distal right MCA branches well perfused. Left M1 duplicated and/or bifurcates early at the origin. No M1 stenosis or occlusion. No proximal left M2 occlusion. Distal left MCA branches well perfused. POSTERIOR CIRCULATION: Vertebral arteries patent to the vertebrobasilar junction without stenosis. Right vertebral artery dominant. Posterior inferior cerebral arteries not visualized. Basilar widely patent to its distal aspect without stenosis. Superior cerebral arteries patent bilaterally. Posterior cerebral arteries well perfused  to their distal aspects without stenosis. Prominent left posterior communicating artery noted. IMPRESSION: MRI HEAD IMPRESSION: 1. Small to moderate evolving acute left MCA territory infarct as above. Associated petechial hemorrhage without frank hemorrhagic transformation or significant mass effect. 2. Underlying moderate chronic microvascular ischemic disease. MRA HEAD IMPRESSION: 1. Negative intracranial MRA. No evidence for large vessel occlusion status post catheter directed thrombectomy. No hemodynamically significant or correctable stenosis identified. 2. Duplicated left M1 segment. Electronically Signed   By: Jeannine Boga M.D.   On: 07/06/2018 19:43   [place]  Result Date: 07/07/2018 INDICATION: Global aphasia. Right-sided hemiplegia. Occluded inferior division of the left middle cerebral artery M2 M3 region EXAM: 1. EMERGENT LARGE VESSEL OCCLUSION THROMBOLYSIS (POSTERIOR CIRCULATION) MEDICATIONS: No antibiotic was administered within 1 hour of the procedure. ANESTHESIA/SEDATION: General anesthesia CONTRAST:  Isovue 300 approximately 110 mL. FLUOROSCOPY TIME:  Fluoroscopy Time: 73 minutes 24 seconds (3818 mGy). COMPLICATIONS: None immediate. TECHNIQUE: Following a full explanation of the procedure along with the potential associated complications, an informed witnessed consent was obtained from the patient's husband. The risks of intracranial hemorrhage of 10%, worsening neurological deficit, ventilator dependency, death and inability to revascularize were all reviewed in detail with the patient's husband. The patient was then put under general anesthesia by the Department of Anesthesiology at [facility]. The right groin was prepped and draped in the usual sterile fashion. Thereafter using modified Seldinger technique, transfemoral access into the right common femoral artery was obtained without difficulty. Over a 0.035 inch guidewire a 5 French Pinnacle sheath was inserted.  Through this, and also over a 0.035 inch guidewire a 5 [place] JB 1 catheter was advanced to the aortic arch region and selectively positioned in the left common carotid artery. FINDINGS: The left common carotid arteriogram demonstrates the left external carotid artery and its major branches to be widely patent. The left internal carotid artery at the bulb demonstrates approximately 60% stenosis by the NASCET criteria secondary to a smooth posterior wall atherosclerotic plaque. Distal to this the left internal  carotid artery is seen to opacify to the cranial skull base. The petrous, cavernous and supraclinoid segments are widely patent. A left posterior communicating artery is seen opacifying the left posterior cerebral artery distribution. 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 The inferior division from the superior duplicated branch demonstrates complete angiographic occlusion. The left anterior cerebral artery opacifies into the capillary and venous phases. The lateral projection of the whole head run demonstrates a large area of hypoperfusion involving the left parietal and posterior frontal regions. PROCEDURE: ENDOVASCULAR REVASCULARIZATION OF OCCLUDED INFERIOR DIVISION OF THE UPPER BRANCH OF THE LEFT MIDDLE CEREBRAL ARTERY, WITH 2 PASSES WITH THE TREVO PROVUE 3 MM X 20 MM RETRIEVAL DEVICE, AND 1 PASS WITH THE 5 MM X 33 MM EMBOTRAP RETRIEVAL DEVICE ACHIEVING A TICI 3 REVASCULARIZATION. The diagnostic JB 1 catheter in the left common carotid artery was exchanged over a 0.035 inch 300 cm Rosen exchange guidewire for an 8 French 55 cm Brite tip neurovascular sheath using biplane roadmap technique and constant fluoroscopic guidance. Good aspiration obtained from the hub of the neurovascular sheath. This was then connected to continuous heparinized saline infusion. Over the [employer] guidewire, an 8 [place] 85 cm FlowGate balloon guide catheter which had been  prepped with 50% contrast and 50% heparinized saline infusion was positioned just proximal to the left common carotid bifurcation. The guidewire was removed. Good aspiration obtained from the hub of the [facility] guide catheter. Gentle contrast injection demonstrated no evidence of spasms, dissections or of intraluminal filling defects. A combination of a Catalyst 5 French 132 cm guide catheter inside of which was an 021 Trevo ProVue microcatheter was advanced over a 0.014 inch Softip Synchro micro guidewire to the distal end of the [facility] guide catheter. With the micro guidewire leading with a J-tip configuration to avoid dissections or inducing spasm, the combination was navigated without difficulty to the supraclinoid left ICA. The superior division was then selected with the micro guidewire followed by the microcatheter. Access through the occluded inferior division was then obtained with the micro guidewire to the M2 M3 regions followed by the microcatheter. The guidewire was removed. Good aspiration obtained from the hub of the microcatheter. A gentle contrast injection demonstrated slow distal antegrade flow. The microcatheter was then connected to continuous heparinized saline infusion. A 3 mm x 20 mm Trevo ProVue retrieval device was advanced to the distal end of microcatheter. The proximal and the distal landing zones were then defined. The O ring on the delivery microcatheter was loosened. With slight forward gentle traction with the right hand on the delivery micro guidewire, with the left hand the delivery microcatheter was retrieved unsheathing the retrieval device. A control arteriogram performed through the Catalyst guide catheter in the supraclinoid left ICA demonstrated partial revascularization of the inferior division of the left middle cerebral artery. With proximal flow arrest in the left common carotid artery at the site of bifurcation, the combination of the retrieval device, the  microcatheter, and the Catalyst guide catheter was retrieved as constant aspiration was applied with a 60 mL syringe at the hub of the [facility] guide catheter, and a Penumbra vacuum suction device at the hub of the Catalyst guide catheter. The combinations were retrieved and removed. No evidence of clot was seen in the retrieval device, or in the aspirate. Aspiration was continued as the proximal flow arrest was reversed. A control arteriogram performed through the [facility] guide catheter in the left common carotid artery demonstrated no  change in the occluded inferior division of the superior branch of the duplicated left MCA. A second attempt was then made again using the above combination. Again access into the distal M2 M3 region of the occluded vessel was obtained with the micro guidewire followed by the microcatheter. After having verified safe position of tip of the microcatheter, the 3 mm x 20 mm Trevo ProVue device was again deployed as above. With proximal flow arrest in the left common carotid artery by inflating the balloon of the [facility] guide catheter, the combination of the retrieval device, microcatheter and the Catalyst guide catheter were retrieved and removed. Again aspiration was applied with a 60 mL syringe at the hub of the [facility] guide catheter, and the Penumbra suction device at the hub of the Catalyst guide catheter. Aspiration was continued as flow arrest was reversed. A control arteriogram performed again through the [facility] guide catheter in the left common carotid artery continued to demonstrate no real opacification of the inferior division of the superior branch of the duplicated left middle cerebral artery. A controlled arteriogram performed was then made using the combination of the Trevo ProVue microcatheter inside of a 5 French 132 cm Catalyst guide catheter which was advanced to the left middle cerebral artery over a 0.014 inch Softip Synchro micro guidewire. Again access into  the occluded inferior division was obtained with the micro guidewire followed by the microcatheter which was positioned in the M3 region of the left middle cerebral artery. The guidewire was removed. Again after having verified safe position of the tip of the microcatheter, a 5 mm x 33 mm Embotrap retrieval device was advanced to the distal end of the microcatheter. The proximal and the distal landing zones were defined. With proximal flow arrest in the left internal carotid artery, the combination of the retrieval device, the microcatheter, and the 5 French 132 cm Catalyst guide catheter were retrieved and removed as constant aspiration was applied with a 60 mL syringe at the hub of the [facility] guide catheter, and the Penumbra suction device at the hub of the Catalyst guide catheter. The combination was retrieved and removed. A clot was noted entangled and the cells of the retrieval device. The aspirate was continued as flow arrest was reversed by deflating the balloon in the left common carotid artery. An arteriogram performed with free back bleed of blood at the hub of the [facility] guide catheter demonstrated now complete revascularization of the previously occluded inferior division of the superior branch of the left middle cerebral artery. A control arteriogram performed centered over the whole head now demonstrated reperfusion of the previously noted hypoperfused parietal and the posterior frontal regions. A TICI 3 revascularization had been achieved of the left middle cerebral distribution. A control arteriogram performed through the [facility] guide catheter in the left common carotid artery centered over the carotid bifurcation continued to demonstrate 60% stenosis of the proximal left internal carotid artery at the bulb. No evidence of dissections or of intraluminal filling defects were noted extra cranially. The 8 [place] FlowGate guide catheter, and the 8 [place] neurovascular sheath were then removed over  a J-tip guidewire for an 8 [place] Pinnacle sheath. This was then removed with the successful application of a 7 [place] Exo-Seal closure device achieving hemostasis. Distal pulses in both feet were Dopplerable in the dorsalis pedis, and the posterior tibial regions bilaterally unchanged from prior to the beginning of the procedure. The right groin appeared soft without evidence of hematoma or bleeding. A Dyna  CT of the brain performed demonstrated a focal area of hyperdensity in the subcortical insular area lateral to the putamen and a small one just cranial and lateral to this. No known gross hemorrhages, or mass effect or midline shift was noted. The patient was left intubated on account of her difficulty with comprehension prior to the intubation. The patient was then transferred to the neuro ICU to continue with post thrombectomy management. IMPRESSION: Status post endovascular complete revascularization of occluded inferior division of the superior duplicated left middle cerebral artery with 2 passes with the 3 mm x 20 mm Trevo ProVue retrieval device, and 1 pass with the Embotrap 5 mm x 33 mm retrieval device achieving a revascularization. PLAN: Follow-up in the clinic in 4 to 6 weeks post discharge. Electronically Signed   By: Luanne Bras M.D.   On: 07/06/2018 14:56   Dg Chest Port 1 View  Result Date: 07/07/2018 CLINICAL DATA:  CHF. Hx of afib, chronic systolic heart failure, COPD, coronary artery disease, hypertension, pre-diabetes, left heart cath and coronary angiography(04/29/17). Former [phone]). EXAM: PORTABLE CHEST 1 VIEW COMPARISON:  07/06/2018 FINDINGS: Moderate enlargement of the cardiopericardial silhouette no overt edema. Atherosclerotic calcification of the aortic arch. The lungs appear otherwise clear. Degenerative glenohumeral spurring. IMPRESSION: 1. Moderate enlargement of the cardiopericardial silhouette, without edema. 2. Degenerative glenohumeral arthropathy bilaterally.  Electronically Signed   By: Van Clines M.D.   On: 07/07/2018 11:48   Portable Chest X-ray  Result Date: 07/06/2018 CLINICAL DATA:  Endotracheal intubation EXAM: PORTABLE CHEST 1 VIEW COMPARISON:  None. FINDINGS: Endotracheal tube tip is at the level of the clavicular heads. There is moderate cardiomegaly without pulmonary edema. No pneumothorax. No focal airspace consolidation. IMPRESSION: 1. Endotracheal tube tip at the level of the clavicular heads. 2. Cardiomegaly. Electronically Signed   By: Ulyses Jarred M.D.   On: 07/06/2018 02:24   Dg Abd Portable 1v  Result Date: 07/06/2018 CLINICAL DATA:  Orogastric tube placement EXAM: PORTABLE ABDOMEN - 1 VIEW COMPARISON:  None. FINDINGS: OG tube tip and side port project over the midline upper abdomen. The tip is in the expected location of the gastroduodenal junction. IMPRESSION: OG tube tip in the expected location of the gastroduodenal junction. Electronically Signed   By: Ulyses Jarred M.D.   On: 07/06/2018 02:23   Ir Percutaneous Art Thrombectomy/infusion Intracranial Inc Diag Angio  Result Date: 07/07/2018 INDICATION: Global aphasia. Right-sided hemiplegia. Occluded inferior division of the left middle cerebral artery M2 M3 region EXAM: 1. EMERGENT LARGE VESSEL OCCLUSION THROMBOLYSIS (POSTERIOR CIRCULATION) MEDICATIONS: No antibiotic was administered within 1 hour of the procedure. ANESTHESIA/SEDATION: General anesthesia CONTRAST:  Isovue 300 approximately 110 mL. FLUOROSCOPY TIME:  Fluoroscopy Time: 73 minutes 24 seconds (3818 mGy). COMPLICATIONS: None immediate. TECHNIQUE: Following a full explanation of the procedure along with the potential associated complications, an informed witnessed consent was obtained from the patient's husband. The risks of intracranial hemorrhage of 10%, worsening neurological deficit, ventilator dependency, death and inability to revascularize were all reviewed in detail with the patient's husband. The patient  was then put under general anesthesia by the Department of Anesthesiology at [facility]. The right groin was prepped and draped in the usual sterile fashion. Thereafter using modified Seldinger technique, transfemoral access into the right common femoral artery was obtained without difficulty. Over a 0.035 inch guidewire a 5 French Pinnacle sheath was inserted. Through this, and also over a 0.035 inch guidewire a 5 [place] JB 1 catheter was advanced to the aortic arch  region and selectively positioned in the left common carotid artery. FINDINGS: The left common carotid arteriogram demonstrates the left external carotid artery and its major branches to be widely patent. The left internal carotid artery at the bulb demonstrates approximately 60% stenosis by the NASCET criteria secondary to a smooth posterior wall atherosclerotic plaque. Distal to this the left internal carotid artery is seen to opacify to the cranial skull base. The petrous, cavernous and supraclinoid segments are widely patent. A left posterior communicating artery is seen opacifying the left posterior cerebral artery distribution. 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 The inferior division from the superior duplicated branch demonstrates complete angiographic occlusion. The left anterior cerebral artery opacifies into the capillary and venous phases. The lateral projection of the whole head run demonstrates a large area of hypoperfusion involving the left parietal and posterior frontal regions. PROCEDURE: ENDOVASCULAR REVASCULARIZATION OF OCCLUDED INFERIOR DIVISION OF THE UPPER BRANCH OF THE LEFT MIDDLE CEREBRAL ARTERY, WITH 2 PASSES WITH THE TREVO PROVUE 3 MM X 20 MM RETRIEVAL DEVICE, AND 1 PASS WITH THE 5 MM X 33 MM EMBOTRAP RETRIEVAL DEVICE ACHIEVING A TICI 3 REVASCULARIZATION. The diagnostic JB 1 catheter in the left common carotid artery was exchanged over a 0.035 inch 300 cm Rosen  exchange guidewire for an 8 French 55 cm Brite tip neurovascular sheath using biplane roadmap technique and constant fluoroscopic guidance. Good aspiration obtained from the hub of the neurovascular sheath. This was then connected to continuous heparinized saline infusion. Over the [employer] guidewire, an 8 [place] 85 cm FlowGate balloon guide catheter which had been prepped with 50% contrast and 50% heparinized saline infusion was positioned just proximal to the left common carotid bifurcation. The guidewire was removed. Good aspiration obtained from the hub of the [facility] guide catheter. Gentle contrast injection demonstrated no evidence of spasms, dissections or of intraluminal filling defects. A combination of a Catalyst 5 French 132 cm guide catheter inside of which was an 021 Trevo ProVue microcatheter was advanced over a 0.014 inch Softip Synchro micro guidewire to the distal end of the [facility] guide catheter. With the micro guidewire leading with a J-tip configuration to avoid dissections or inducing spasm, the combination was navigated without difficulty to the supraclinoid left ICA. The superior division was then selected with the micro guidewire followed by the microcatheter. Access through the occluded inferior division was then obtained with the micro guidewire to the M2 M3 regions followed by the microcatheter. The guidewire was removed. Good aspiration obtained from the hub of the microcatheter. A gentle contrast injection demonstrated slow distal antegrade flow. The microcatheter was then connected to continuous heparinized saline infusion. A 3 mm x 20 mm Trevo ProVue retrieval device was advanced to the distal end of microcatheter. The proximal and the distal landing zones were then defined. The O ring on the delivery microcatheter was loosened. With slight forward gentle traction with the right hand on the delivery micro guidewire, with the left hand the delivery microcatheter was retrieved  unsheathing the retrieval device. A control arteriogram performed through the Catalyst guide catheter in the supraclinoid left ICA demonstrated partial revascularization of the inferior division of the left middle cerebral artery. With proximal flow arrest in the left common carotid artery at the site of bifurcation, the combination of the retrieval device, the microcatheter, and the Catalyst guide catheter was retrieved as constant aspiration was applied with a 60 mL syringe at the hub of the [facility] guide catheter, and  a Penumbra vacuum suction device at the hub of the Catalyst guide catheter. The combinations were retrieved and removed. No evidence of clot was seen in the retrieval device, or in the aspirate. Aspiration was continued as the proximal flow arrest was reversed. A control arteriogram performed through the [facility] guide catheter in the left common carotid artery demonstrated no change in the occluded inferior division of the superior branch of the duplicated left MCA. A second attempt was then made again using the above combination. Again access into the distal M2 M3 region of the occluded vessel was obtained with the micro guidewire followed by the microcatheter. After having verified safe position of tip of the microcatheter, the 3 mm x 20 mm Trevo ProVue device was again deployed as above. With proximal flow arrest in the left common carotid artery by inflating the balloon of the [facility] guide catheter, the combination of the retrieval device, microcatheter and the Catalyst guide catheter were retrieved and removed. Again aspiration was applied with a 60 mL syringe at the hub of the [facility] guide catheter, and the Penumbra suction device at the hub of the Catalyst guide catheter. Aspiration was continued as flow arrest was reversed. A control arteriogram performed again through the [facility] guide catheter in the left common carotid artery continued to demonstrate no real opacification of the  inferior division of the superior branch of the duplicated left middle cerebral artery. A controlled arteriogram performed was then made using the combination of the Trevo ProVue microcatheter inside of a 5 French 132 cm Catalyst guide catheter which was advanced to the left middle cerebral artery over a 0.014 inch Softip Synchro micro guidewire. Again access into the occluded inferior division was obtained with the micro guidewire followed by the microcatheter which was positioned in the M3 region of the left middle cerebral artery. The guidewire was removed. Again after having verified safe position of the tip of the microcatheter, a 5 mm x 33 mm Embotrap retrieval device was advanced to the distal end of the microcatheter. The proximal and the distal landing zones were defined. With proximal flow arrest in the left internal carotid artery, the combination of the retrieval device, the microcatheter, and the 5 French 132 cm Catalyst guide catheter were retrieved and removed as constant aspiration was applied with a 60 mL syringe at the hub of the [facility] guide catheter, and the Penumbra suction device at the hub of the Catalyst guide catheter. The combination was retrieved and removed. A clot was noted entangled and the cells of the retrieval device. The aspirate was continued as flow arrest was reversed by deflating the balloon in the left common carotid artery. An arteriogram performed with free back bleed of blood at the hub of the [facility] guide catheter demonstrated now complete revascularization of the previously occluded inferior division of the superior branch of the left middle cerebral artery. A control arteriogram performed centered over the whole head now demonstrated reperfusion of the previously noted hypoperfused parietal and the posterior frontal regions. A TICI 3 revascularization had been achieved of the left middle cerebral distribution. A control arteriogram performed through the [facility] guide  catheter in the left common carotid artery centered over the carotid bifurcation continued to demonstrate 60% stenosis of the proximal left internal carotid artery at the bulb. No evidence of dissections or of intraluminal filling defects were noted extra cranially. The 8 [place] FlowGate guide catheter, and the 8 [place] neurovascular sheath were then removed over a J-tip guidewire for an  Provo sheath. This was then removed with the successful application of a 7 [place] Exo-Seal closure device achieving hemostasis. Distal pulses in both feet were Dopplerable in the dorsalis pedis, and the posterior tibial regions bilaterally unchanged from prior to the beginning of the procedure. The right groin appeared soft without evidence of hematoma or bleeding. A Dyna CT of the brain performed demonstrated a focal area of hyperdensity in the subcortical insular area lateral to the putamen and a small one just cranial and lateral to this. No known gross hemorrhages, or mass effect or midline shift was noted. The patient was left intubated on account of her difficulty with comprehension prior to the intubation. The patient was then transferred to the neuro ICU to continue with post thrombectomy management. IMPRESSION: Status post endovascular complete revascularization of occluded inferior division of the superior duplicated left middle cerebral artery with 2 passes with the 3 mm x 20 mm Trevo ProVue retrieval device, and 1 pass with the Embotrap 5 mm x 33 mm retrieval device achieving a revascularization. PLAN: Follow-up in the clinic in 4 to 6 weeks post discharge. Electronically Signed   By: Luanne Bras M.D.   On: 07/06/2018 14:56   Carotid (at [place] Only)  Result Date: 07/07/2018 Carotid Arterial Duplex Study Indications: CVA. Performing Technologist: Oliver Hum RVT  Examination Guidelines: A complete evaluation includes B-mode imaging, spectral Doppler, color Doppler, and power Doppler as  needed of all accessible portions of each vessel. Bilateral testing is considered an integral part of a complete examination. Limited examinations for reoccurring indications may be performed as noted.  Right Carotid Findings: +----------+--------+-------+--------+--------------------------------+--------+           PSV cm/sEDV    StenosisDescribe                        Comments                   cm/s                                                    +----------+--------+-------+--------+--------------------------------+--------+ CCA Prox  65      17             smooth and heterogenous                  +----------+--------+-------+--------+--------------------------------+--------+ CCA Distal53      13             smooth, heterogenous and                                                  calcific                                 +----------+--------+-------+--------+--------------------------------+--------+ ICA Prox  56      16             smooth, heterogenous and  calcific                                 +----------+--------+-------+--------+--------------------------------+--------+ ICA Distal73      22                                             tortuous +----------+--------+-------+--------+--------------------------------+--------+ ECA       331     37             calcific                                 +----------+--------+-------+--------+--------------------------------+--------+ +----------+--------+-------+--------+-------------------+           PSV cm/sEDV cmsDescribeArm Pressure (mmHG) +----------+--------+-------+--------+-------------------+ [id number]                                        +----------+--------+-------+--------+-------------------+ +---------+--------+--+--------+--+---------+ VertebralPSV cm/s60EDV cm/s20Antegrade  +---------+--------+--+--------+--+---------+  Left Carotid Findings: +----------+--------+-------+--------+--------------------------------+--------+           PSV cm/sEDV    StenosisDescribe                        Comments                   cm/s                                                    +----------+--------+-------+--------+--------------------------------+--------+ CCA Prox  70      20             smooth and heterogenous                  +----------+--------+-------+--------+--------------------------------+--------+ CCA Distal68      14             smooth, heterogenous and                                                  calcific                                 +----------+--------+-------+--------+--------------------------------+--------+ ICA Prox  166     44             smooth, heterogenous and                                                  calcific                                 +----------+--------+-------+--------+--------------------------------+--------+ ICA Mid   102     23  tortuous +----------+--------+-------+--------+--------------------------------+--------+ ICA Distal56      18                                             tortuous +----------+--------+-------+--------+--------------------------------+--------+ ECA       57      11             calcific                                 +----------+--------+-------+--------+--------------------------------+--------+ +----------+--------+--------+--------+-------------------+ SubclavianPSV cm/sEDV cm/sDescribeArm Pressure (mmHG) +----------+--------+--------+--------+-------------------+           102                                         +----------+--------+--------+--------+-------------------+ +---------+--------+--+--------+--+---------+ VertebralPSV cm/s54EDV cm/s15Antegrade  +---------+--------+--+--------+--+---------+  Summary: Right Carotid: Velocities in the right ICA are consistent with a 1-39% stenosis. Left Carotid: Velocities in the left ICA are consistent with a 40-59% stenosis. Vertebrals: Bilateral vertebral arteries demonstrate antegrade flow. *See table(s) above for measurements and observations.  Electronically signed by Antony Contras MD on 07/07/2018 at 8:02:21 AM.    Final    CXR 07/08/18 IMPRESSION: Stable cardiomegaly.  No acute abnormality seen.   PHYSICAL EXAM  Temp:  [98.1 F (36.7 C)-99.2 F (37.3 C)] 98.2 F (36.8 C) (11/21 0800) Pulse Rate:  [57-97] 57 (11/21 1100) Resp:  [2-29] 21 (11/21 1100) BP: (99-159)/(46-142) 147/75 (11/21 0800) SpO2:  [94 %-100 %] 98 % (11/21 1100)  General - Well nourished, well developed, not in acute distress  Ophthalmologic - fundi not visualized due to noncooperation.  Cardiovascular - irregularly irregular heart rate and rhythm.  Neuro - awake alert, sitting in bed, eyes open and smiling to provider, and following some simple commands but not all of them. Still has expressive aphasia with word salad. Eyes move in both directions, no forced gaze. Right facial droop, tongue midline, blinking to visual threat bilaterally. LUE and LLE 4+/5, RUE 4/5 proximal but 0/5 distal finger movement. RLE 3+/5 proximal and 4/5 distally. DTR 1+ and positive babinski on the right. Sensation and coordination not cooperative. Gait not tested.    ASSESSMENT/PLAN Kaitlyn Underwood is a 77 y.o. female with history of afib not on AC, CAD, HTN, HLD, DM and obsity admitted for aphasia and right sided weakness. TPA given and s/p thrombectomy.    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  DSA left M2/M3 occlusion s/p TICI3  reperfusion  MRI left MCA small to moderate infarcts   MRA unremarkable, duplicated MCA  CUS left ICA 40-59% stenosis  2D Echo EF 25-30% down from 04/2017 with 35-40%  LDL 72  HgbA1c 5.6  SCDs for VTE prophylaxis  aspirin 325 mg daily prior to admission, now on eliquis for stroke prevention  Ongoing aggressive stroke risk factor management  Therapy recommendations:  CIR  Disposition:  Pending  CHF  EF 25-30% down from 35-40% one year ago  CCM on board  received IV lasix 40 07/07/18  Off IVF  Continue  digoxin and metoprolol  CXR cardiomegaly but no CHF  Continue tele monitoring  Chronic afib on ASA   Followed with Dr. Geraldo Pitter  Not on [facility] due to back surgery with unsteady gait  On ASA 325 PTA  Rate controlled  On digoxin and metoprolol PTA  Now on eliquis for stroke prevention   Spiking fever, resolved  Tmax 101.2->afebrile  CXR 07/07/18 - Moderate enlargement of the cardiopericardial silhouette, without edema.  CXR stable cardiomegaly, no acute abnormality  UA pending   Diabetes  HgbA1c 5.6 goal < 7.0  Controlled  CBG monitoring  SSI  Hypertension . Stable . BP goal < 160  Long term BP goal normotensive  Hyperlipidemia  Home meds:  none   LDL 72, goal < 70  On pravastatin low dose  Continue statin on discharge  Dysphagia   On pudding thick liquid  Speech on board  IVF as needed  Other Stroke Risk Factors  Advanced age  Obesity, Body mass index is 36.58 kg/m.   Coronary artery disease  Other Active Problems  Depression on zoloft  Hospital day # 4  Rosalin Hawking, MD PhD Stroke Neurology 07/09/2018 11:47 AM    To contact Stroke Continuity provider, please refer to [email or web address] After hours, contact General Neurology

## 2018-07-09 NOTE — Discharge Instructions (Signed)

## 2018-07-09 NOTE — Progress Notes (Signed)
IP rehab admissions - I met with patient and one of her daughters.  Patient has 5 daughters.  Daughter is interested in rehab here in the hospital.  I have called and opened the case with HTA.  Will await call back from insurance case manager.  Call me for questions.  [phone]

## 2018-07-10 ENCOUNTER — Inpatient Hospital Stay (HOSPITAL_COMMUNITY)
Admission: RE | Admit: 2018-07-10 | Discharge: 2018-07-25 | DRG: 057 | Disposition: A | Discharge status: Home Health | Payer: PPO | Source: Intra-hospital | Attending: Physical Medicine & Rehabilitation | Admitting: Physical Medicine & Rehabilitation

## 2018-07-10 ENCOUNTER — Encounter (HOSPITAL_COMMUNITY): Payer: Self-pay

## 2018-07-10 ENCOUNTER — Other Ambulatory Visit: Payer: Self-pay | Admitting: Neurology

## 2018-07-10 ENCOUNTER — Other Ambulatory Visit: Payer: Self-pay

## 2018-07-10 DIAGNOSIS — I69351 Hemiplegia and hemiparesis following cerebral infarction affecting right dominant side: Secondary | ICD-10-CM | POA: Diagnosis not present

## 2018-07-10 DIAGNOSIS — I69391 Dysphagia following cerebral infarction: Secondary | ICD-10-CM

## 2018-07-10 DIAGNOSIS — I10 Essential (primary) hypertension: Secondary | ICD-10-CM | POA: Diagnosis not present

## 2018-07-10 DIAGNOSIS — R062 Wheezing: Secondary | ICD-10-CM

## 2018-07-10 DIAGNOSIS — I69320 Aphasia following cerebral infarction: Secondary | ICD-10-CM

## 2018-07-10 DIAGNOSIS — R7303 Prediabetes: Secondary | ICD-10-CM | POA: Diagnosis not present

## 2018-07-10 DIAGNOSIS — R001 Bradycardia, unspecified: Secondary | ICD-10-CM | POA: Diagnosis not present

## 2018-07-10 DIAGNOSIS — R1310 Dysphagia, unspecified: Secondary | ICD-10-CM | POA: Diagnosis present

## 2018-07-10 DIAGNOSIS — N2889 Other specified disorders of kidney and ureter: Secondary | ICD-10-CM | POA: Diagnosis not present

## 2018-07-10 DIAGNOSIS — I69392 Facial weakness following cerebral infarction: Secondary | ICD-10-CM

## 2018-07-10 DIAGNOSIS — I110 Hypertensive heart disease with heart failure: Secondary | ICD-10-CM | POA: Diagnosis present

## 2018-07-10 DIAGNOSIS — E039 Hypothyroidism, unspecified: Secondary | ICD-10-CM | POA: Diagnosis not present

## 2018-07-10 DIAGNOSIS — I5022 Chronic systolic (congestive) heart failure: Secondary | ICD-10-CM | POA: Diagnosis present

## 2018-07-10 DIAGNOSIS — J449 Chronic obstructive pulmonary disease, unspecified: Secondary | ICD-10-CM | POA: Diagnosis not present

## 2018-07-10 DIAGNOSIS — I482 Chronic atrial fibrillation, unspecified: Secondary | ICD-10-CM | POA: Diagnosis present

## 2018-07-10 DIAGNOSIS — E785 Hyperlipidemia, unspecified: Secondary | ICD-10-CM | POA: Diagnosis present

## 2018-07-10 DIAGNOSIS — I63412 Cerebral infarction due to embolism of left middle cerebral artery: Secondary | ICD-10-CM

## 2018-07-10 DIAGNOSIS — I480 Paroxysmal atrial fibrillation: Secondary | ICD-10-CM | POA: Diagnosis present

## 2018-07-10 DIAGNOSIS — I63512 Cerebral infarction due to unspecified occlusion or stenosis of left middle cerebral artery: Secondary | ICD-10-CM | POA: Diagnosis not present

## 2018-07-10 DIAGNOSIS — J410 Simple chronic bronchitis: Secondary | ICD-10-CM | POA: Diagnosis not present

## 2018-07-10 DIAGNOSIS — I2510 Atherosclerotic heart disease of native coronary artery without angina pectoris: Secondary | ICD-10-CM | POA: Diagnosis not present

## 2018-07-10 DIAGNOSIS — G8191 Hemiplegia, unspecified affecting right dominant side: Secondary | ICD-10-CM | POA: Diagnosis present

## 2018-07-10 DIAGNOSIS — J42 Unspecified chronic bronchitis: Secondary | ICD-10-CM | POA: Diagnosis not present

## 2018-07-10 LAB — CBC
HCT: 33.6 % — ABNORMAL LOW (ref 36.0–46.0)
Hemoglobin: 10.3 g/dL — ABNORMAL LOW (ref 12.0–15.0)
MCH: 29.9 pg (ref 26.0–34.0)
MCHC: 30.7 g/dL (ref 30.0–36.0)
MCV: 97.7 fL (ref 80.0–100.0)
PLATELETS: 377 10*3/uL (ref 150–400)
RBC: 3.44 MIL/uL — ABNORMAL LOW (ref 3.87–5.11)
RDW: 14 % (ref 11.5–15.5)
WBC: 8.6 10*3/uL (ref 4.0–10.5)
nRBC: 0 % (ref 0.0–0.2)

## 2018-07-10 LAB — GLUCOSE, CAPILLARY
GLUCOSE-CAPILLARY: 97 mg/dL (ref 70–99)
GLUCOSE-CAPILLARY: 132 mg/dL — AB (ref 70–99)
Glucose-Capillary: 100 mg/dL — ABNORMAL HIGH (ref 70–99)
Glucose-Capillary: 104 mg/dL — ABNORMAL HIGH (ref 70–99)

## 2018-07-10 LAB — BASIC METABOLIC PANEL
Anion gap: 7 (ref 5–15)
BUN: 15 mg/dL (ref 8–23)
CHLORIDE: 113 mmol/L — AB (ref 98–111)
CO2: 23 mmol/L (ref 22–32)
CREATININE: 0.88 mg/dL (ref 0.44–1.00)
Calcium: 9.6 mg/dL (ref 8.9–10.3)
GFR calc Af Amer: >60 mL/min (ref >60)
Glucose, Bld: 88 mg/dL (ref 70–99)
Potassium: 4 mmol/L (ref 3.5–5.1)
SODIUM: 143 mmol/L (ref 135–145)

## 2018-07-10 MED ORDER — METOPROLOL SUCCINATE ER 50 MG PO TB24
50.0000 mg | ORAL_TABLET | Freq: Every day | ORAL | Status: DC
  Filled 2018-07-10: qty 1

## 2018-07-10 MED ORDER — ACETAMINOPHEN 325 MG PO TABS: 650.0000 mg | ORAL_TABLET | ORAL | Status: DC | PRN

## 2018-07-10 MED ORDER — DOCUSATE SODIUM 50 MG/5ML PO LIQD: 100.0000 mg | Freq: Two times a day (BID) | ORAL | Status: DC | PRN

## 2018-07-10 MED ORDER — SODIUM CHLORIDE 0.9 % IV SOLN
INTRAVENOUS | Status: DC
  Administered 2018-07-10 – 2018-07-11 (×3): via INTRAVENOUS

## 2018-07-10 MED ORDER — INSULIN ASPART 100 UNIT/ML ~~LOC~~ SOLN
0.0000 [IU] | Freq: Three times a day (TID) | SUBCUTANEOUS | Status: DC
  Administered 2018-07-10 – 2018-07-15 (×4): 2 [IU] via SUBCUTANEOUS

## 2018-07-10 MED ORDER — ACETAMINOPHEN 160 MG/5ML PO SOLN: 650.0000 mg | ORAL | Status: DC | PRN

## 2018-07-10 MED ORDER — PRAVASTATIN SODIUM 10 MG PO TABS
20.0000 mg | ORAL_TABLET | Freq: Every day | ORAL | Status: DC
  Administered 2018-07-10 – 2018-07-24 (×15): 20 mg via ORAL
  Filled 2018-07-10: qty 2
  Filled 2018-07-10: qty 1
  Filled 2018-07-10: qty 2
  Filled 2018-07-10 (×2): qty 1
  Filled 2018-07-10 (×2): qty 2
  Filled 2018-07-10 (×2): qty 1
  Filled 2018-07-10: qty 2
  Filled 2018-07-10: qty 1
  Filled 2018-07-10 (×2): qty 2
  Filled 2018-07-10 (×3): qty 1
  Filled 2018-07-10: qty 2
  Filled 2018-07-10 (×2): qty 1
  Filled 2018-07-10: qty 2
  Filled 2018-07-10 (×3): qty 1
  Filled 2018-07-10: qty 2

## 2018-07-10 MED ORDER — APIXABAN 5 MG PO TABS
5.0000 mg | ORAL_TABLET | Freq: Two times a day (BID) | ORAL | Status: DC
  Administered 2018-07-10 – 2018-07-25 (×30): 5 mg via ORAL
  Filled 2018-07-10 (×30): qty 1

## 2018-07-10 MED ORDER — INSULIN ASPART 100 UNIT/ML ~~LOC~~ SOLN: 0.0000 [IU] | Freq: Three times a day (TID) | SUBCUTANEOUS | Status: DC

## 2018-07-10 MED ORDER — SERTRALINE HCL 50 MG PO TABS
25.0000 mg | ORAL_TABLET | Freq: Every day | ORAL | Status: DC
  Administered 2018-07-11 – 2018-07-25 (×15): 25 mg via ORAL
  Filled 2018-07-10 (×15): qty 1

## 2018-07-10 MED ORDER — ALBUTEROL SULFATE (2.5 MG/3ML) 0.083% IN NEBU
2.5000 mg | INHALATION_SOLUTION | RESPIRATORY_TRACT | Status: DC | PRN
  Administered 2018-07-13: 2.5 mg via RESPIRATORY_TRACT
  Filled 2018-07-10: qty 3

## 2018-07-10 MED ORDER — SODIUM CHLORIDE 0.9 % IV SOLN: INTRAVENOUS | Status: DC

## 2018-07-10 MED ORDER — MONTELUKAST SODIUM 10 MG PO TABS
10.0000 mg | ORAL_TABLET | Freq: Every day | ORAL | Status: DC
  Administered 2018-07-10 – 2018-07-24 (×15): 10 mg
  Filled 2018-07-10 (×15): qty 1

## 2018-07-10 MED ORDER — PANTOPRAZOLE SODIUM 40 MG PO TBEC
40.0000 mg | DELAYED_RELEASE_TABLET | Freq: Every day | ORAL | Status: DC
  Administered 2018-07-11 – 2018-07-17 (×7): 40 mg via ORAL
  Filled 2018-07-10 (×7): qty 1

## 2018-07-10 MED ORDER — DIGOXIN 125 MCG PO TABS
0.1250 mg | ORAL_TABLET | Freq: Every day | ORAL | Status: DC
  Administered 2018-07-10: 0.125 mg via ORAL
  Filled 2018-07-10: qty 1

## 2018-07-10 MED ORDER — ACETAMINOPHEN 650 MG RE SUPP: 650.0000 mg | RECTAL | Status: DC | PRN

## 2018-07-10 MED ORDER — IPRATROPIUM-ALBUTEROL 0.5-2.5 (3) MG/3ML IN SOLN
3.0000 mL | Freq: Two times a day (BID) | RESPIRATORY_TRACT | Status: DC
  Administered 2018-07-10 – 2018-07-12 (×3): 3 mL via RESPIRATORY_TRACT
  Filled 2018-07-10 (×3): qty 3

## 2018-07-10 MED ORDER — RESOURCE THICKENUP CLEAR PO POWD
ORAL | Status: DC | PRN
  Filled 2018-07-10: qty 125

## 2018-07-10 MED ORDER — BUDESONIDE 0.5 MG/2ML IN SUSP
0.5000 mg | Freq: Two times a day (BID) | RESPIRATORY_TRACT | Status: DC
  Administered 2018-07-10 – 2018-07-25 (×26): 0.5 mg via RESPIRATORY_TRACT
  Filled 2018-07-10 (×31): qty 2

## 2018-07-10 MED ORDER — DIGOXIN 125 MCG PO TABS
0.1250 mg | ORAL_TABLET | Freq: Every day | ORAL | Status: DC
  Administered 2018-07-11 – 2018-07-25 (×15): 0.125 mg via ORAL
  Filled 2018-07-10 (×15): qty 1

## 2018-07-10 MED ORDER — IPRATROPIUM-ALBUTEROL 0.5-2.5 (3) MG/3ML IN SOLN
3.0000 mL | Freq: Two times a day (BID) | RESPIRATORY_TRACT | Status: DC
  Administered 2018-07-10: 3 mL via RESPIRATORY_TRACT
  Filled 2018-07-10: qty 3

## 2018-07-10 NOTE — Progress Notes (Signed)
IP rehab admissions - I met with patient, another daughter and a grand daughter at the bedside.  All are in agreement to inpatient rehab admission.  I have approval for acute inpatient rehab admission for today.  Bed available and will admit to CIR today.  Call me for questions.  [phone]

## 2018-07-10 NOTE — Progress Notes (Signed)
OT Progress Note  Making excellent progress but continues to require mod A with dynamic mobility and ADL tasks due to below listed deficits. Continue to recommend rehab at [facility]. Family present during session and agree with recommendation. Will continue to follow acutely.     07/10/18 1140  OT Visit Information  Last OT Received On 07/10/18  Assistance Needed +2  PT/OT/SLP Co-Evaluation/Treatment Yes  Reason for Co-Treatment Complexity of the patient's impairments (multi-system involvement);For patient/therapist safety;To address functional/ADL transfers  OT goals addressed during session ADL's and self-care  History of Present Illness 77 y.o. female admitted on 07/05/18 for R sided weakness and aphasia.  Pt treated for possible stroke after CT was negative for hemorrhage with IV tPA and underwent Lt common carotid arteriogram followed by complete revascularization of occluded Lt MCA.  MRI revealed L MCA infarct and associated petechial hemmorrhage.  Pt with significant PMH of pre-diabetes, HRN, CAD, COPD, chronic systolic heart failure, and A-fib.                                                                                        Precautions  Precautions Fall  Pain Assessment  Pain Assessment No/denies pain  Cognition  Arousal/Alertness Awake/alert  Behavior During Therapy WFL for tasks assessed/performed (tearful at times with inability to due to tasks)  Overall Cognitive Status Impaired/Different from baseline  Area of Impairment Awareness;Safety/judgement;Following commands;Memory;Attention;Problem solving  Current Attention Level Sustained  Memory Decreased short-term memory  Following Commands Follows one step commands with increased time  Safety/Judgement Decreased awareness of safety;Decreased awareness of deficits  Awareness Emergent  Problem Solving Requires verbal cues;Requires tactile cues  Upper Extremity Assessment  Upper Extremity Assessment RUE deficits/detail  RUE  Deficits / Details using funcitonally wiht isolated joint movment; generalized weakness with decreased coordination  RUE Coordination decreased fine motor;decreased gross motor  Lower Extremity Assessment  Lower Extremity Assessment Defer to PT evaluation  ADL  Overall ADL's  Needs assistance/impaired  Grooming Moderate assistance;Brushing hair  Toilet Transfer Minimal assistance;Ambulation;RW;Grab bars;Comfort height toilet  Toilet Transfer Details (indicate cue type and reason) more difficulty with spatial management with turns  Toileting- Clothing Manipulation and Hygiene Moderate assistance  Functional mobility during ADLs Rolling walker;Cueing for safety;Moderate assistance (running RW into wall but able to correct once wall was hit)  Bed Mobility  General bed mobility comments in chair on arrival   Balance  Overall balance assessment Needs assistance  Sitting balance-Leahy Scale Good  Standing balance support Bilateral upper extremity supported  Standing balance-Leahy Scale Poor  Standing balance comment reliant on external support  Vision- Assessment  Additional Comments vision impaired. will further assess; poor spatial orientaiton  Transfers  Overall transfer level Needs assistance  Equipment used Rolling walker (2 wheeled)  Transfers Sit to/from Stand  Sit to Stand Min assist  Stand pivot transfers Mod assist  General transfer comment increased assistance required; vc to position sefl inside of RW  Exercises  Exercises Other exercises  Other Exercises  Other Exercises encouraged functional use of RUE  OT - End of Session  Equipment Utilized During Treatment Gait belt;Rolling walker  Activity Tolerance Patient tolerated treatment well  Patient  left in bed;with call bell/phone within reach;with chair alarm set;with family/visitor present  Nurse Communication Mobility status  OT Assessment/Plan  OT Plan Discharge plan remains appropriate  OT Visit Diagnosis Other  abnormalities of gait and mobility (R26.89);Muscle weakness (generalized) (M62.81);Cognitive communication deficit (R41.841)  Symptoms and signs involving cognitive functions Cerebral infarction  OT Frequency (ACUTE ONLY) Min 2X/week  Recommendations for Other Services Rehab consult  Follow Up Recommendations CIR  OT Equipment 3 in 1 bedside commode  AM-PAC OT "6 Clicks" Daily Activity Outcome Measure  Help from another person eating meals? 3  Help from another person taking care of personal grooming? 3  Help from another person toileting, which includes using toliet, bedpan, or urinal? 2  Help from another person bathing (including washing, rinsing, drying)? 2  Help from another person to put on and taking off regular upper body clothing? 2  Help from another person to put on and taking off regular lower body clothing? 2  6 Click Score 14  ADL G Code Conversion CK  OT Goal Progression  Progress towards OT goals Progressing toward goals  Acute Rehab OT Goals  Patient Stated Goal to get better and go shopping  OT Goal Formulation With patient/family  Time For Goal Achievement 07/22/18  Potential to Achieve Goals Good  ADL Goals  Pt Will Perform Grooming sitting;with supervision;with set-up  Pt Will Perform Upper Body Bathing with supervision;with set-up;sitting  Pt Will Perform Lower Body Dressing with supervision;with set-up;sit to/from stand  Pt/caregiver will Perform Home Exercise Program Right Upper extremity;Increased strength;With theraband;With written HEP provided  Additional ADL Goal #1 Pt will follow 1-2 step commands with 100% accuracy in order to increase participation and sequencing with ADLs.  OT Time Calculation  OT Start Time (ACUTE ONLY) 1056  OT Stop Time (ACUTE ONLY) 1124  OT Time Calculation (min) 28 min  OT General Charges  $OT Visit 1 Visit  OT Treatments  $Self Care/Home Management  8-22 mins  Maurie Boettcher, OT/L   Acute OT Clinical Specialist [place] Pager [phone] Office [phone]

## 2018-07-10 NOTE — H&P (Signed)
Physical Medicine and Rehabilitation Admission H&P     HPI: Kaitlyn Underwood is a 77 year old right handed female history of hypertension,Pre-diabetes, hyperlipidemia,atrial fibrillation maintained on Lanoxin, chronic systolic congestive heart failure, COPD, CAD maintained on aspirin. Patient reportedly lives with spouse. Presented to [facility] 07/06/2018 with right-sided weakness and aphasia. CT/CTA outside hospital showed occlusion of substantial vessel in the left MCA distribution. Patient didn't receive TPA and was transferred to [facility] for further evaluation. MRI the brain showed small to moderate evolving acute left MCA territory infarction with associated petechial hemorrhage without frank hemorrhagic transformation. Negative MRA. Underwent revascularization thrombectomy per interventional radiology. EEG negative for seizure. Echocardiogram with ejection fraction of 30% diffuse hypokinesis. Dysphagia #1 pudding thick liquid diet. Currently maintained on Eliquis for CVA prophylaxis. Therapy evaluations completed with recommendations of physical medicine rehabilitation consult. Patient was admitted for a comprehensive rehabilitation program.  Review of Systems  Constitutional: Negative for fever.  HENT: Negative for hearing loss.   Eyes: Negative for pain.  Respiratory: Negative for cough.   Cardiovascular: Negative for chest pain.  Gastrointestinal: Negative for nausea and vomiting.  Genitourinary: Negative for dysuria.  Musculoskeletal: Negative for myalgias.  Skin: Negative for rash.  Neurological: Positive for speech change and focal weakness. Negative for dizziness and headaches.  Endo/Heme/Allergies: Does not bruise/bleed easily.  Psychiatric/Behavioral: Negative for depression and suicidal ideas.   Past Medical History:  Diagnosis Date  . Anxiety   . Atrial fibrillation ([place])   . Chronic systolic heart failure ([place])   . COPD (chronic obstructive pulmonary  disease) ([place])   . Coronary artery disease   . Hypertension   . Hypothyroidism   . Left renal mass    unconfirmed documentation of "kidney cancer", possibly cyst  . Pre-diabetes    Past Surgical History:  Procedure Laterality Date  . IR CT HEAD LTD  07/06/2018  . IR PERCUTANEOUS ART THROMBECTOMY/INFUSION INTRACRANIAL INC DIAG ANGIO  07/06/2018  . LEFT HEART CATH AND CORONARY ANGIOGRAPHY N/A 04/29/2017   Procedure: LEFT HEART CATH AND CORONARY ANGIOGRAPHY;  Surgeon: Belva Crome, MD;  Location: [place] [facility];  Service: Cardiovascular;  Laterality: N/A;  . RADIOLOGY WITH ANESTHESIA N/A 07/05/2018   Procedure: RADIOLOGY WITH ANESTHESIA;  Surgeon: Luanne Bras, MD;  Location: [place];  Service: Radiology;  Laterality: N/A;  . ULTRASOUND GUIDANCE FOR VASCULAR ACCESS  04/29/2017   Procedure: Ultrasound Guidance For Vascular Access;  Surgeon: Belva Crome, MD;  Location: [place] [facility];  Service: Cardiovascular;;   Family History  Problem Relation Age of Onset  . Arrhythmia Sister   . Heart disease Brother   . Heart attack Maternal Uncle    Social History:  reports that she quit smoking about 28 years ago. Her smoking use included cigarettes. She has a 20.00 pack-year smoking history. She has never used smokeless tobacco. She reports that she does not drink alcohol or use drugs. Allergies:  Allergies  Allergen Reactions  . Sulfa Antibiotics Anaphylaxis    tongue   Medications Prior to Admission  Medication Sig Dispense Refill  . albuterol (PROVENTIL HFA;VENTOLIN HFA) 108 (90 Base) MCG/ACT inhaler Inhale 2 puffs into the lungs every 6 (six) hours as needed for wheezing or shortness of breath.     . Ascorbic Acid (VITAMIN C) 1000 MG tablet Take 1,000 mg by mouth 2 (two) times daily.    Marland Kitchen aspirin EC 325 MG tablet Take 1 tablet (325 mg total) by mouth daily. 30 tablet 0  .  budesonide (PULMICORT) 0.5 MG/2ML nebulizer solution Take 0.5 mg by nebulization daily.     . captopril  (CAPOTEN) 50 MG tablet TAKE 1 TABLET BY MOUTH THREE TIMES DAILY (Patient taking differently: Take 50 mg by mouth 3 (three) times daily. ) 270 tablet 1  . digoxin (LANOXIN) 0.125 MG tablet TAKE 1 TABLET BY MOUTH ONCE DAILY (Patient taking differently: Take 0.125 mg by mouth daily. ) 90 tablet 1  . febuxostat (ULORIC) 40 MG tablet Take 40 mg by mouth daily.  12  . furosemide (LASIX) 40 MG tablet Take 40 mg by mouth daily.    Marland Kitchen gabapentin (NEURONTIN) 300 MG capsule Take 300 mg by mouth 3 (three) times daily.  2  . Glucosamine Sulfate 500 MG TABS Take 500 mg by mouth daily.    Marland Kitchen ipratropium-albuterol (DUONEB) 0.5-2.5 (3) MG/3ML SOLN Take 3 mLs by nebulization every 6 (six) hours as needed (shortness of breath/wheezing.).     Marland Kitchen metoprolol succinate (TOPROL-XL) 50 MG 24 hr tablet Take 1 tablet (50 mg total) by mouth daily. 90 tablet 0  . montelukast (SINGULAIR) 10 MG tablet Take 10 mg by mouth daily.    . nitroGLYCERIN (NITROSTAT) 0.4 MG SL tablet Place 0.4 mg under the tongue every 5 (five) minutes as needed for chest pain.     . pantoprazole (PROTONIX) 40 MG tablet Take 40 mg by mouth daily.    . traMADol (ULTRAM) 50 MG tablet Take 50 mg by mouth daily as needed for moderate pain.     . vitamin E 400 UNIT capsule Take 400 Units by mouth daily.    . captopril (CAPOTEN) 50 MG tablet Take 1 tablet (50 mg total) by mouth 3 (three) times daily. (Patient not taking: Reported on 07/06/2018) 270 tablet 3    Drug Regimen Review Drug regimen was reviewed and remains appropriate with no significant issues identified  Home: Home Living Family/patient expects to be discharged to:: Private residence Available Help at Discharge: (UTA) Type of Home: (UTA) Additional Comments: Pt unable to report due to severe expressive aphasia.    Lives With: (UTA)   Functional History: Prior Function Comments: assumed independent, chart did not reveal any prior therapy, no family present to report.   Functional Status:    Mobility: Bed Mobility Overal bed mobility: Needs Assistance Bed Mobility: Supine to Sit Supine to sit: Mod assist, HOB elevated General bed mobility comments: increased time, assist for R LE and to use of pad to scoot hips as she scooted them Transfers Overall transfer level: Needs assistance Equipment used: Rolling walker (2 wheeled) Transfers: Sit to/from Stand, [employer] Transfers Sit to Stand: Mod assist, +2 physical assistance Stand pivot transfers: Mod assist, +2 physical assistance General transfer comment: assist to rise from bed, to place R hand on walker and for stepping to recliner with RW cues for posture, safety, attention to R LE Ambulation/Gait General Gait Details: unable to safely at this time.     ADL: ADL Overall ADL's : Needs assistance/impaired Grooming: Minimal assistance, Sitting Upper Body Bathing: Moderate assistance, Sitting Lower Body Bathing: Maximal assistance, +2 for physical assistance, Sit to/from stand Upper Body Dressing : Moderate assistance, Sitting Lower Body Dressing: +2 for physical assistance, Sit to/from stand, Total assistance Toilet Transfer: Moderate assistance, +2 for physical assistance, BSC, Stand-pivot, Cueing for safety, Cueing for sequencing Toileting- Clothing Manipulation and Hygiene: Total assistance, +2 for physical assistance, Sit to/from stand Functional mobility during ADLs: Moderate assistance, +2 for physical assistance, Cueing for safety, Cueing  for sequencing General ADL Comments: limited by aphasia, cognition, R sided weakness, and impaired balance   Cognition: Cognition Overall Cognitive Status: Impaired/Different from baseline Arousal/Alertness: Awake/alert Orientation Level: Oriented to person, Oriented to place, Disoriented to time, Disoriented to situation Attention: Focused, Sustained Focused Attention: Appears intact Sustained Attention: Appears intact Memory: (further assess) Awareness: (further  assess) Problem Solving: (further assess) Executive Function: Initiating Initiating: Impaired(able to initiate suctioning) Initiating Impairment: Verbal basic Behaviors: Lability Safety/Judgment: Impaired Cognition Arousal/Alertness: Awake/alert Behavior During Therapy: WFL for tasks assessed/performed Overall Cognitive Status: Impaired/Different from baseline Area of Impairment: Attention, Safety/judgement, Problem solving Current Attention Level: Sustained Memory: Decreased recall of precautions Following Commands: Follows one step commands with increased time Safety/Judgement: Decreased awareness of safety, Decreased awareness of deficits Awareness: Intellectual Problem Solving: Decreased initiation, Requires verbal cues, Requires tactile cues General Comments: some apraxia possibly noted with difficulty performing shoulder shrug with verbal cues and demonstration and increased time  Physical Exam: Blood pressure (!) 158/82, pulse (!) 58, temperature 98.4 F (36.9 C), temperature source Oral, resp. rate 17, height 5' 2" (1.575 m), weight 90.7 kg, SpO2 97 %. Physical Exam  Constitutional: She appears well-developed. She appears distressed.  obese  HENT:  Head: Normocephalic and atraumatic.  Eyes: Pupils are equal, round, and reactive to light. Right eye exhibits no discharge. Left eye exhibits no discharge.  Neck: Normal range of motion. No tracheal deviation present. No thyromegaly present.  Cardiovascular: Normal rate and regular rhythm.  Respiratory: Effort normal. No respiratory distress. She has no wheezes.  GI: Soft. She exhibits no distension. There is no tenderness.  Neurological:  Pt alert, oriented to person, place, reason she's here. Able to name simple objects with extra time due to expressive aphasia. Beginning to speak in short phrases. Comprehension appears intact.  Speech slurred. Right central 7. RUE grossly 3- to 3/5 prox to distal. RLE 3/5 HF, KE and 3+ ADF/PF.  LUE and LLE 4+/5. Senses pain and LT in all 4 limbs. DTR's 1+. Toes down.   Skin:  A few scattered bruises.   Psychiatric: She has a normal mood and affect. Her behavior is normal. Judgment and thought content normal.    Results for orders placed or performed during the hospital encounter of 07/05/18 (from the past 48 hour(s))  Glucose, capillary     Status: None   Collection Time: 07/08/18  8:27 AM  Result Value Ref Range   Glucose-Capillary 74 70 - 99 mg/dL  Glucose, capillary     Status: Abnormal   Collection Time: 07/08/18  3:23 PM  Result Value Ref Range   Glucose-Capillary 115 (H) 70 - 99 mg/dL   Comment 1 Notify RN    Comment 2 Document in Chart   Glucose, capillary     Status: Abnormal   Collection Time: 07/08/18  7:35 PM  Result Value Ref Range   Glucose-Capillary 108 (H) 70 - 99 mg/dL  Glucose, capillary     Status: None   Collection Time: 07/08/18 11:53 PM  Result Value Ref Range   Glucose-Capillary 90 70 - 99 mg/dL  Glucose, capillary     Status: Abnormal   Collection Time: 07/09/18  3:44 AM  Result Value Ref Range   Glucose-Capillary 101 (H) 70 - 99 mg/dL  CBC     Status: Abnormal   Collection Time: 07/09/18  5:24 AM  Result Value Ref Range   WBC 8.8 4.0 - 10.5 K/uL   RBC 3.44 (L) 3.87 - 5.11 MIL/uL   Hemoglobin 10.4 (  L) 12.0 - 15.0 g/dL   HCT 33.6 (L) 36.0 - 46.0 %   MCV 97.7 80.0 - 100.0 fL   MCH 30.2 26.0 - 34.0 pg   MCHC 31.0 30.0 - 36.0 g/dL   RDW 14.0 11.5 - 15.5 %   Platelets 320 150 - 400 K/uL   nRBC 0.0 0.0 - 0.2 %    Comment: Performed at [facility], [place] [address], [place], [place] [address]  Basic metabolic panel     Status: Abnormal   Collection Time: 07/09/18  5:24 AM  Result Value Ref Range   Sodium 143 135 - 145 mmol/L   Potassium 4.1 3.5 - 5.1 mmol/L   Chloride 113 (H) 98 - 111 mmol/L   CO2 22 22 - 32 mmol/L   Glucose, Bld 101 (H) 70 - 99 mg/dL   BUN 15 8 - 23 mg/dL   Creatinine, Ser 0.96 0.44 - 1.00 mg/dL   Calcium 9.7 8.9 -  10.3 mg/dL   GFR calc non Af Amer 56 (L) >60 mL/min   GFR calc Af Amer >60 >60 mL/min    Comment: (NOTE) The eGFR has been calculated using the CKD EPI equation. This calculation has not been validated in all clinical situations. eGFR's persistently <60 mL/min signify possible Chronic Kidney Disease.    Anion gap 8 5 - 15    Comment: Performed at [facility] [address] [place], [place] [address]  Glucose, capillary     Status: None   Collection Time: 07/09/18  8:14 AM  Result Value Ref Range   Glucose-Capillary 88 70 - 99 mg/dL   Comment 1 Notify RN    Comment 2 Document in Chart   Glucose, capillary     Status: Abnormal   Collection Time: 07/09/18 11:32 AM  Result Value Ref Range   Glucose-Capillary 107 (H) 70 - 99 mg/dL   Comment 1 Notify RN    Comment 2 Document in Chart   Glucose, capillary     Status: Abnormal   Collection Time: 07/09/18  3:44 PM  Result Value Ref Range   Glucose-Capillary 130 (H) 70 - 99 mg/dL   Comment 1 Notify RN    Comment 2 Document in Chart   Glucose, capillary     Status: Abnormal   Collection Time: 07/09/18  7:41 PM  Result Value Ref Range   Glucose-Capillary 122 (H) 70 - 99 mg/dL  Glucose, capillary     Status: None   Collection Time: 07/09/18 11:32 PM  Result Value Ref Range   Glucose-Capillary 99 70 - 99 mg/dL  Glucose, capillary     Status: Abnormal   Collection Time: 07/10/18  3:23 AM  Result Value Ref Range   Glucose-Capillary 100 (H) 70 - 99 mg/dL  CBC     Status: Abnormal   Collection Time: 07/10/18  4:03 AM  Result Value Ref Range   WBC 8.6 4.0 - 10.5 K/uL   RBC 3.44 (L) 3.87 - 5.11 MIL/uL   Hemoglobin 10.3 (L) 12.0 - 15.0 g/dL   HCT 33.6 (L) 36.0 - 46.0 %   MCV 97.7 80.0 - 100.0 fL   MCH 29.9 26.0 - 34.0 pg   MCHC 30.7 30.0 - 36.0 g/dL   RDW 14.0 11.5 - 15.5 %   Platelets 377 150 - 400 K/uL   nRBC 0.0 0.0 - 0.2 %    Comment: Performed at [facility], [place]. [address], [place], [place] [address]  Basic  metabolic panel     Status: Abnormal   Collection Time: 07/10/18  4:03 AM  Result Value Ref Range   Sodium 143 135 - 145 mmol/L   Potassium 4.0 3.5 - 5.1 mmol/L   Chloride 113 (H) 98 - 111 mmol/L   CO2 23 22 - 32 mmol/L   Glucose, Bld 88 70 - 99 mg/dL   BUN 15 8 - 23 mg/dL   Creatinine, Ser 0.88 0.44 - 1.00 mg/dL   Calcium 9.6 8.9 - 10.3 mg/dL   GFR calc non Af Amer >60 >60 mL/min   GFR calc Af Amer >60 >60 mL/min    Comment: (NOTE) The eGFR has been calculated using the CKD EPI equation. This calculation has not been validated in all clinical situations. eGFR's persistently <60 mL/min signify possible Chronic Kidney Disease.    Anion gap 7 5 - 15    Comment: Performed at [facility] [address] [place], [place] [address]   Dg Chest Port 1 View  Result Date: 07/08/2018 CLINICAL DATA:  Congestive heart failure. EXAM: PORTABLE CHEST 1 VIEW COMPARISON:  Radiograph of July 07, 2018. FINDINGS: Stable cardiomegaly. No pneumothorax or pleural effusion is noted. Both lungs are clear. The visualized skeletal structures are unremarkable. IMPRESSION: Stable cardiomegaly.  No acute abnormality seen. Electronically Signed   By: Marijo Conception, M.D.   On: 07/08/2018 10:55   Dg Swallowing Func-speech Pathology  Result Date: 07/08/2018 Objective Swallowing Evaluation: Type of Study: MBS-Modified Barium Swallow Study  Patient Details Name: GREENLEE ANCHETA MRN: [id number] Date of Birth: [birth year] Today's Date: 07/08/2018 Time: SLP Start Time (ACUTE ONLY): 9323 -SLP Stop Time (ACUTE ONLY): 1217 SLP Time Calculation (min) (ACUTE ONLY): 18 min Past Medical History: Past Medical History: Diagnosis Date . Anxiety  . Atrial fibrillation ([place])  . Chronic systolic heart failure ([place])  . COPD (chronic obstructive pulmonary disease) ([place])  . Coronary artery disease  . Hypertension  . Hypothyroidism  . Left renal mass   unconfirmed documentation of "kidney cancer", possibly cyst . Pre-diabetes  Past  Surgical History: Past Surgical History: Procedure Laterality Date . IR CT HEAD LTD  07/06/2018 . IR PERCUTANEOUS ART THROMBECTOMY/INFUSION INTRACRANIAL INC DIAG ANGIO  07/06/2018 . LEFT HEART CATH AND CORONARY ANGIOGRAPHY N/A 04/29/2017  Procedure: LEFT HEART CATH AND CORONARY ANGIOGRAPHY;  Surgeon: Belva Crome, MD;  Location: [place] [facility];  Service: Cardiovascular;  Laterality: N/A; . RADIOLOGY WITH ANESTHESIA N/A 07/05/2018  Procedure: RADIOLOGY WITH ANESTHESIA;  Surgeon: Luanne Bras, MD;  Location: [place];  Service: Radiology;  Laterality: N/A; . ULTRASOUND GUIDANCE FOR VASCULAR ACCESS  04/29/2017  Procedure: Ultrasound Guidance For Vascular Access;  Surgeon: Belva Crome, MD;  Location: [place] [facility];  Service: Cardiovascular;; HPI: ROGENE METH is a 77 y.o. female with a history of HTN, hyperlipidemia, COPD, afib who presented with R weakness and aphasia. CTA showed occluded distal LT MCA - M2, had TPA and emergent thrombectomy at [facility] ETT 11/17-11/19. Per MD note, pt has mild wheezing and secretions. CXR moderate enlargement of the cardiopericardial silhouette, without edema and degenerative glenohumeral arthropathy bilaterally. 11/18 MRI small to moderate evolving acute L MCA territory infarct, associated petechial hemorrhage without frank hemorrhagic transformation.  No data recorded Assessment / Plan / Recommendation CHL IP CLINICAL IMPRESSIONS 07/08/2018 Clinical Impression Pt presents with mod-servere oropharyngeal dysphagia marked by oral holding, reduced posterior propulsion, lingual and valleculuar residue, as well as penetration and aspiration of nectar and honey  thick liquids. Pt's sensation of penetrates/aspirates was significantly delayed, with a reflexive cough only triggering after multiple instances of aspiration. Lingual and vallecular residue from nectar and honey were consistently aspirated after the swallow and delayed arytenoid excursion to epiglottic petiole preventing  complete laryngeal vestibule closure resulted in penetration of nectar and honey during the swallow. Pt's airway protection remained in tact during trials of puree, however reduced posterior propulsion of boluses and subsequent oral residue require pt to perform additional dry swallow for complete oral transit and clearance without pharyngeal residue. Recommend pt initiate puree diet, pudding thick liquids, meds crushed in puree with FULL supervision and assistance with feeding to ensure small bites/sips and verbal reminder for second swallow after each bite/sip. ST will continue to follow to provide treatment with diet safety, efficiency, compensatory strategy training, and potential for diet/liquid advancement. SLP Visit Diagnosis Dysphagia, oropharyngeal phase (R13.12) Attention and concentration deficit following -- Frontal lobe and executive function deficit following -- Impact on safety and function Severe aspiration risk   CHL IP TREATMENT RECOMMENDATION 07/08/2018 Treatment Recommendations Therapy as outlined in treatment plan below   Prognosis 07/08/2018 Prognosis for Safe Diet Advancement Fair Barriers to Reach Goals Language deficits;Severity of deficits Barriers/Prognosis Comment -- CHL IP DIET RECOMMENDATION 07/08/2018 SLP Diet Recommendations Dysphagia 1 (Puree) solids;Pudding thick liquid Liquid Administration via Spoon Medication Administration Crushed with puree Compensations Slow rate;Small sips/bites;Clear throat intermittently;Multiple dry swallows after each bite/sip;Minimize environmental distractions Postural Changes Seated upright at 90 degrees   CHL IP OTHER RECOMMENDATIONS 07/08/2018 Recommended Consults -- Oral Care Recommendations Oral care BID Other Recommendations Order thickener from pharmacy;Remove water pitcher;Have oral suction available   CHL IP FOLLOW UP RECOMMENDATIONS 07/08/2018 Follow up Recommendations Skilled Nursing facility   [facility] IP FREQUENCY AND DURATION 07/08/2018 Speech  Therapy Frequency (ACUTE ONLY) min 2x/week Treatment Duration 2 weeks      CHL IP ORAL PHASE 07/08/2018 Oral Phase Impaired Oral - Pudding Teaspoon -- Oral - Pudding Cup -- Oral - Honey Teaspoon -- Oral - Honey Cup Lingual pumping;Lingual/palatal residue;Delayed oral transit;Holding of bolus Oral - Nectar Teaspoon -- Oral - Nectar Cup Right anterior bolus loss;Holding of bolus;Lingual/palatal residue;Delayed oral transit Oral - Nectar Straw -- Oral - Thin Teaspoon -- Oral - Thin Cup -- Oral - Thin Straw -- Oral - Puree Piecemeal swallowing;Weak lingual manipulation;Lingual pumping;Delayed oral transit;Reduced posterior propulsion;Holding of bolus Oral - Mech Soft -- Oral - Regular -- Oral - Multi-Consistency -- Oral - Pill -- Oral Phase - Comment --  CHL IP PHARYNGEAL PHASE 07/08/2018 Pharyngeal Phase Impaired Pharyngeal- Pudding Teaspoon -- Pharyngeal -- Pharyngeal- Pudding Cup -- Pharyngeal -- Pharyngeal- Honey Teaspoon -- Pharyngeal -- Pharyngeal- Honey Cup Penetration/Aspiration during swallow;Penetration/Apiration after swallow;Pharyngeal residue - valleculae;Pharyngeal residue - posterior pharnyx;Delayed swallow initiation-pyriform sinuses Pharyngeal Material enters airway, passes BELOW cords without attempt by patient to eject out (silent aspiration);Material enters airway, CONTACTS cords and not ejected out Pharyngeal- Nectar Teaspoon -- Pharyngeal -- Pharyngeal- Nectar Cup Penetration/Apiration after swallow;Pharyngeal residue - valleculae;Penetration/Aspiration during swallow;Reduced epiglottic inversion Pharyngeal Material enters airway, CONTACTS cords and not ejected out;Material enters airway, passes BELOW cords then ejected out Pharyngeal- Nectar Straw -- Pharyngeal -- Pharyngeal- Thin Teaspoon -- Pharyngeal -- Pharyngeal- Thin Cup -- Pharyngeal -- Pharyngeal- Thin Straw -- Pharyngeal -- Pharyngeal- Puree WFL Pharyngeal -- Pharyngeal- Mechanical Soft -- Pharyngeal -- Pharyngeal- Regular -- Pharyngeal  -- Pharyngeal- Multi-consistency -- Pharyngeal -- Pharyngeal- Pill -- Pharyngeal -- Pharyngeal Comment --  CHL IP CERVICAL ESOPHAGEAL PHASE 07/08/2018 Cervical Esophageal Phase [facility] Pudding  Teaspoon -- Pudding Cup -- Honey Teaspoon -- Honey Cup -- Nectar Teaspoon -- Nectar Cup -- Nectar Straw -- Thin Teaspoon -- Thin Cup -- Thin Straw -- Puree -- Mechanical Soft -- Regular -- Multi-consistency -- Pill -- Cervical Esophageal Comment -- Houston Siren 07/08/2018, 3:49 PM Orbie Pyo Litaker M.Ed Risk analyst [phone] Office [phone]                  Medical Problem List and Plan: 1.  Right side weakness with facial droop and aphasia secondary to left MCA infarction  -admit to inpatient rehab 2.  DVT Prophylaxis/Anticoagulation: Eliquis 3. Pain Management:  Tylenol as needed 4. Mood:  Zoloft 25 mg daily 5. Neuropsych: This patient is capable of making decisions on her own behalf. 6. Skin/Wound Care:  Routine skin checks 7. Fluids/Electrolytes/Nutrition:  Routine in and out's with follow-up chemistries 8. Dysphagia. Dysphagia #1 pudding thick liquids. Monitor hydration. Follow-up speech therapy  -BMET on admit 9. Hypertension. Toprol 50 mg daily 10. Atrial fibrillation. Lanoxin 0.125 mg daily. Cardiac rate controlled 11.COPD.Continue nebulizers 12.Chronic systolic congestive heart failure.Monitor for any signs of fluid overload  -daily weights 13. Hyperlipidemia. Pravachol 14. Pre-diabetes. Hemoglobin A1c 5.6. SSI   Post Admission Physician Evaluation: 1. Functional deficits secondary  to left MCA infarct. 2. Patient is admitted to receive collaborative, interdisciplinary care between the physiatrist, rehab nursing staff, and therapy team. 3. Patient's level of medical complexity and substantial therapy needs in context of that medical necessity cannot be provided at a lesser intensity of care such as a SNF. 4. Patient has experienced substantial functional  loss from his/her baseline which was documented above under the "Functional History" and "Functional Status" headings.  Judging by the patient's diagnosis, physical exam, and functional history, the patient has potential for functional progress which will result in measurable gains while on inpatient rehab.  These gains will be of substantial and practical use upon discharge  in facilitating mobility and self-care at the household level. 5. Physiatrist will provide 24 hour management of medical needs as well as oversight of the therapy plan/treatment and provide guidance as appropriate regarding the interaction of the two. 6. The Preadmission Screening has been reviewed and patient status is unchanged unless otherwise stated above. 7. 24 hour rehab nursing will assist with bladder management, bowel management, safety, skin/wound care, disease management, medication administration, pain management and patient education  and help integrate therapy concepts, techniques,education, etc. 8. PT will assess and treat for/with: Lower extremity strength, range of motion, stamina, balance, functional mobility, safety, adaptive techniques and equipment, NMR, visual-spatial awareness, family ed.   Goals are: supervision to min assist. 9. OT will assess and treat for/with: ADL's, functional mobility, safety, upper extremity strength, adaptive techniques and equipment, NMR, family ed.   Goals are: supervision to min assist. Therapy may proceed with showering this patient. 10. SLP will assess and treat for/with: cognition, language, swallowing.  Goals are: supervision to min assist. 11. Case Management and Social Worker will assess and treat for psychological issues and discharge planning. 12. Team conference will be held weekly to assess progress toward goals and to determine barriers to discharge. 13. Patient will receive at least 3 hours of therapy per day at least 5 days per week. 14. ELOS: 18-25 days        15. Prognosis:  excellent   I have personally performed a face to face diagnostic evaluation of this patient and formulated the key components of the plan.  Additionally, I have personally reviewed laboratory data, imaging studies, as well as relevant notes and concur with the physician assistant's documentation above.  Meredith Staggers, MD, FAAPMR    Lavon Paganini Shepherd, PA-C 07/10/2018

## 2018-07-10 NOTE — H&P (Signed)
Physical Medicine and Rehabilitation Admission H&P       HPI: Kaitlyn Underwood is a 77 year old right handed female history of hypertension,Pre-diabetes, hyperlipidemia,atrial fibrillation maintained on Lanoxin, chronic systolic congestive heart failure, COPD, CAD maintained on aspirin. Patient reportedly lives with spouse. Presented to [facility] 07/06/2018 with right-sided weakness and aphasia. CT/CTA outside hospital showed occlusion of substantial vessel in the left MCA distribution. Patient didn't receive TPA and was transferred to [facility] for further evaluation. MRI the brain showed small to moderate evolving acute left MCA territory infarction with associated petechial hemorrhage without frank hemorrhagic transformation. Negative MRA. Underwent revascularization thrombectomy per interventional radiology. EEG negative for seizure. Echocardiogram with ejection fraction of 30% diffuse hypokinesis. Dysphagia #1 pudding thick liquid diet. Currently maintained on Eliquis for CVA prophylaxis. Therapy evaluations completed with recommendations of physical medicine rehabilitation consult. Patient was admitted for a comprehensive rehabilitation program.   Review of Systems  Constitutional: Negative for fever.  HENT: Negative for hearing loss.   Eyes: Negative for pain.  Respiratory: Negative for cough.   Cardiovascular: Negative for chest pain.  Gastrointestinal: Negative for nausea and vomiting.  Genitourinary: Negative for dysuria.  Musculoskeletal: Negative for myalgias.  Skin: Negative for rash.  Neurological: Positive for speech change and focal weakness. Negative for dizziness and headaches.  Endo/Heme/Allergies: Does not bruise/bleed easily.  Psychiatric/Behavioral: Negative for depression and suicidal ideas.        Past Medical History:  Diagnosis Date  . Anxiety    . Atrial fibrillation ([place])    . Chronic systolic heart failure ([place])    . COPD (chronic  obstructive pulmonary disease) ([place])    . Coronary artery disease    . Hypertension    . Hypothyroidism    . Left renal mass      unconfirmed documentation of "kidney cancer", possibly cyst  . Pre-diabetes           Past Surgical History:  Procedure Laterality Date  . IR CT HEAD LTD   07/06/2018  . IR PERCUTANEOUS ART THROMBECTOMY/INFUSION INTRACRANIAL INC DIAG ANGIO   07/06/2018  . LEFT HEART CATH AND CORONARY ANGIOGRAPHY N/A 04/29/2017    Procedure: LEFT HEART CATH AND CORONARY ANGIOGRAPHY;  Surgeon: Belva Crome, MD;  Location: [place] [facility];  Service: Cardiovascular;  Laterality: N/A;  . RADIOLOGY WITH ANESTHESIA N/A 07/05/2018    Procedure: RADIOLOGY WITH ANESTHESIA;  Surgeon: Luanne Bras, MD;  Location: [place];  Service: Radiology;  Laterality: N/A;  . ULTRASOUND GUIDANCE FOR VASCULAR ACCESS   04/29/2017    Procedure: Ultrasound Guidance For Vascular Access;  Surgeon: Belva Crome, MD;  Location: [place] [facility];  Service: Cardiovascular;;         Family History  Problem Relation Age of Onset  . Arrhythmia Sister    . Heart disease Brother    . Heart attack Maternal Uncle      Social History:  reports that she quit smoking about 28 years ago. Her smoking use included cigarettes. She has a 20.00 pack-year smoking history. She has never used smokeless tobacco. She reports that she does not drink alcohol or use drugs. Allergies:       Allergies  Allergen Reactions  . Sulfa Antibiotics Anaphylaxis      tongue          Medications Prior to Admission  Medication Sig Dispense Refill  . albuterol (PROVENTIL HFA;VENTOLIN HFA) 108 (90 Base) MCG/ACT inhaler Inhale 2 puffs into  the lungs every 6 (six) hours as needed for wheezing or shortness of breath.       . Ascorbic Acid (VITAMIN C) 1000 MG tablet Take 1,000 mg by mouth 2 (two) times daily.      Marland Kitchen aspirin EC 325 MG tablet Take 1 tablet (325 mg total) by mouth daily. 30 tablet 0  . budesonide (PULMICORT) 0.5  MG/2ML nebulizer solution Take 0.5 mg by nebulization daily.       . captopril (CAPOTEN) 50 MG tablet TAKE 1 TABLET BY MOUTH THREE TIMES DAILY (Patient taking differently: Take 50 mg by mouth 3 (three) times daily. ) 270 tablet 1  . digoxin (LANOXIN) 0.125 MG tablet TAKE 1 TABLET BY MOUTH ONCE DAILY (Patient taking differently: Take 0.125 mg by mouth daily. ) 90 tablet 1  . febuxostat (ULORIC) 40 MG tablet Take 40 mg by mouth daily.   12  . furosemide (LASIX) 40 MG tablet Take 40 mg by mouth daily.      Marland Kitchen gabapentin (NEURONTIN) 300 MG capsule Take 300 mg by mouth 3 (three) times daily.   2  . Glucosamine Sulfate 500 MG TABS Take 500 mg by mouth daily.      Marland Kitchen ipratropium-albuterol (DUONEB) 0.5-2.5 (3) MG/3ML SOLN Take 3 mLs by nebulization every 6 (six) hours as needed (shortness of breath/wheezing.).       Marland Kitchen metoprolol succinate (TOPROL-XL) 50 MG 24 hr tablet Take 1 tablet (50 mg total) by mouth daily. 90 tablet 0  . montelukast (SINGULAIR) 10 MG tablet Take 10 mg by mouth daily.      . nitroGLYCERIN (NITROSTAT) 0.4 MG SL tablet Place 0.4 mg under the tongue every 5 (five) minutes as needed for chest pain.       . pantoprazole (PROTONIX) 40 MG tablet Take 40 mg by mouth daily.      . traMADol (ULTRAM) 50 MG tablet Take 50 mg by mouth daily as needed for moderate pain.       . vitamin E 400 UNIT capsule Take 400 Units by mouth daily.      . captopril (CAPOTEN) 50 MG tablet Take 1 tablet (50 mg total) by mouth 3 (three) times daily. (Patient not taking: Reported on 07/06/2018) 270 tablet 3      Drug Regimen Review Drug regimen was reviewed and remains appropriate with no significant issues identified   Home: Home Living Family/patient expects to be discharged to:: Private residence Available Help at Discharge: (UTA) Type of Home: (UTA) Additional Comments: Pt unable to report due to severe expressive aphasia.    Lives With: (UTA)   Functional History: Prior Function Comments: assumed  independent, chart did not reveal any prior therapy, no family present to report.    Functional Status:  Mobility: Bed Mobility Overal bed mobility: Needs Assistance Bed Mobility: Supine to Sit Supine to sit: Mod assist, HOB elevated General bed mobility comments: increased time, assist for R LE and to use of pad to scoot hips as she scooted them Transfers Overall transfer level: Needs assistance Equipment used: Rolling walker (2 wheeled) Transfers: Sit to/from Stand, [employer] Transfers Sit to Stand: Mod assist, +2 physical assistance Stand pivot transfers: Mod assist, +2 physical assistance General transfer comment: assist to rise from bed, to place R hand on walker and for stepping to recliner with RW cues for posture, safety, attention to R LE Ambulation/Gait General Gait Details: unable to safely at this time.    ADL: ADL Overall ADL's : Needs assistance/impaired Grooming:  Minimal assistance, Sitting Upper Body Bathing: Moderate assistance, Sitting Lower Body Bathing: Maximal assistance, +2 for physical assistance, Sit to/from stand Upper Body Dressing : Moderate assistance, Sitting Lower Body Dressing: +2 for physical assistance, Sit to/from stand, Total assistance Toilet Transfer: Moderate assistance, +2 for physical assistance, BSC, Stand-pivot, Cueing for safety, Cueing for sequencing Toileting- Clothing Manipulation and Hygiene: Total assistance, +2 for physical assistance, Sit to/from stand Functional mobility during ADLs: Moderate assistance, +2 for physical assistance, Cueing for safety, Cueing for sequencing General ADL Comments: limited by aphasia, cognition, R sided weakness, and impaired balance    Cognition: Cognition Overall Cognitive Status: Impaired/Different from baseline Arousal/Alertness: Awake/alert Orientation Level: Oriented to person, Oriented to place, Disoriented to time, Disoriented to situation Attention: Focused, Sustained Focused Attention:  Appears intact Sustained Attention: Appears intact Memory: (further assess) Awareness: (further assess) Problem Solving: (further assess) Executive Function: Initiating Initiating: Impaired(able to initiate suctioning) Initiating Impairment: Verbal basic Behaviors: Lability Safety/Judgment: Impaired Cognition Arousal/Alertness: Awake/alert Behavior During Therapy: WFL for tasks assessed/performed Overall Cognitive Status: Impaired/Different from baseline Area of Impairment: Attention, Safety/judgement, Problem solving Current Attention Level: Sustained Memory: Decreased recall of precautions Following Commands: Follows one step commands with increased time Safety/Judgement: Decreased awareness of safety, Decreased awareness of deficits Awareness: Intellectual Problem Solving: Decreased initiation, Requires verbal cues, Requires tactile cues General Comments: some apraxia possibly noted with difficulty performing shoulder shrug with verbal cues and demonstration and increased time   Physical Exam: Blood pressure (!) 158/82, pulse (!) 58, temperature 98.4 F (36.9 C), temperature source Oral, resp. rate 17, height '5\' 2"'$  (1.575 m), weight 90.7 kg, SpO2 97 %. Physical Exam  Constitutional: She appears well-developed. She appears distressed.  obese  HENT:  Head: Normocephalic and atraumatic.  Eyes: Pupils are equal, round, and reactive to light. Right eye exhibits no discharge. Left eye exhibits no discharge.  Neck: Normal range of motion. No tracheal deviation present. No thyromegaly present.  Cardiovascular: Normal rate and regular rhythm.  Respiratory: Effort normal. No respiratory distress. She has no wheezes.  GI: Soft. She exhibits no distension. There is no tenderness.  Neurological:  Pt alert, oriented to person, place, reason she's here. Able to name simple objects with extra time due to expressive aphasia. Beginning to speak in short phrases. Comprehension appears intact.   Speech slurred. Right central 7. RUE grossly 3- to 3/5 prox to distal. RLE 3/5 HF, KE and 3+ ADF/PF. LUE and LLE 4+/5. Senses pain and LT in all 4 limbs. DTR's 1+. Toes down.   Skin:  A few scattered bruises.   Psychiatric: She has a normal mood and affect. Her behavior is normal. Judgment and thought content normal.      Lab Results Last 48 Hours        Results for orders placed or performed during the hospital encounter of 07/05/18 (from the past 48 hour(s))  Glucose, capillary     Status: None    Collection Time: 07/08/18  8:27 AM  Result Value Ref Range    Glucose-Capillary 74 70 - 99 mg/dL  Glucose, capillary     Status: Abnormal    Collection Time: 07/08/18  3:23 PM  Result Value Ref Range    Glucose-Capillary 115 (H) 70 - 99 mg/dL    Comment 1 Notify RN      Comment 2 Document in Chart    Glucose, capillary     Status: Abnormal    Collection Time: 07/08/18  7:35 PM  Result Value Ref Range  Glucose-Capillary 108 (H) 70 - 99 mg/dL  Glucose, capillary     Status: None    Collection Time: 07/08/18 11:53 PM  Result Value Ref Range    Glucose-Capillary 90 70 - 99 mg/dL  Glucose, capillary     Status: Abnormal    Collection Time: 07/09/18  3:44 AM  Result Value Ref Range    Glucose-Capillary 101 (H) 70 - 99 mg/dL  CBC     Status: Abnormal    Collection Time: 07/09/18  5:24 AM  Result Value Ref Range    WBC 8.8 4.0 - 10.5 K/uL    RBC 3.44 (L) 3.87 - 5.11 MIL/uL    Hemoglobin 10.4 (L) 12.0 - 15.0 g/dL    HCT 33.6 (L) 36.0 - 46.0 %    MCV 97.7 80.0 - 100.0 fL    MCH 30.2 26.0 - 34.0 pg    MCHC 31.0 30.0 - 36.0 g/dL    RDW 14.0 11.5 - 15.5 %    Platelets 320 150 - 400 K/uL    nRBC 0.0 0.0 - 0.2 %      Comment: Performed at [facility], [place]. [address], [place], [place] [address]  Basic metabolic panel     Status: Abnormal    Collection Time: 07/09/18  5:24 AM  Result Value Ref Range    Sodium 143 135 - 145 mmol/L    Potassium 4.1 3.5 - 5.1 mmol/L    Chloride 113  (H) 98 - 111 mmol/L    CO2 22 22 - 32 mmol/L    Glucose, Bld 101 (H) 70 - 99 mg/dL    BUN 15 8 - 23 mg/dL    Creatinine, Ser 0.96 0.44 - 1.00 mg/dL    Calcium 9.7 8.9 - 10.3 mg/dL    GFR calc non Af Amer 56 (L) >60 mL/min    GFR calc Af Amer >60 >60 mL/min      Comment: (NOTE) The eGFR has been calculated using the CKD EPI equation. This calculation has not been validated in all clinical situations. eGFR's persistently <60 mL/min signify possible Chronic Kidney Disease.      Anion gap 8 5 - 15      Comment: Performed at [facility] [address], [place], [place] [address]  Glucose, capillary     Status: None    Collection Time: 07/09/18  8:14 AM  Result Value Ref Range    Glucose-Capillary 88 70 - 99 mg/dL    Comment 1 Notify RN      Comment 2 Document in Chart    Glucose, capillary     Status: Abnormal    Collection Time: 07/09/18 11:32 AM  Result Value Ref Range    Glucose-Capillary 107 (H) 70 - 99 mg/dL    Comment 1 Notify RN      Comment 2 Document in Chart    Glucose, capillary     Status: Abnormal    Collection Time: 07/09/18  3:44 PM  Result Value Ref Range    Glucose-Capillary 130 (H) 70 - 99 mg/dL    Comment 1 Notify RN      Comment 2 Document in Chart    Glucose, capillary     Status: Abnormal    Collection Time: 07/09/18  7:41 PM  Result Value Ref Range    Glucose-Capillary 122 (H) 70 - 99 mg/dL  Glucose, capillary     Status: None    Collection Time: 07/09/18 11:32 PM  Result  Value Ref Range    Glucose-Capillary 99 70 - 99 mg/dL  Glucose, capillary     Status: Abnormal    Collection Time: 07/10/18  3:23 AM  Result Value Ref Range    Glucose-Capillary 100 (H) 70 - 99 mg/dL  CBC     Status: Abnormal    Collection Time: 07/10/18  4:03 AM  Result Value Ref Range    WBC 8.6 4.0 - 10.5 K/uL    RBC 3.44 (L) 3.87 - 5.11 MIL/uL    Hemoglobin 10.3 (L) 12.0 - 15.0 g/dL    HCT 33.6 (L) 36.0 - 46.0 %    MCV 97.7 80.0 - 100.0 fL    MCH 29.9 26.0 - 34.0 pg     MCHC 30.7 30.0 - 36.0 g/dL    RDW 14.0 11.5 - 15.5 %    Platelets 377 150 - 400 K/uL    nRBC 0.0 0.0 - 0.2 %      Comment: Performed at [facility], [place]. [address], [place], [place] [address]  Basic metabolic panel     Status: Abnormal    Collection Time: 07/10/18  4:03 AM  Result Value Ref Range    Sodium 143 135 - 145 mmol/L    Potassium 4.0 3.5 - 5.1 mmol/L    Chloride 113 (H) 98 - 111 mmol/L    CO2 23 22 - 32 mmol/L    Glucose, Bld 88 70 - 99 mg/dL    BUN 15 8 - 23 mg/dL    Creatinine, Ser 0.88 0.44 - 1.00 mg/dL    Calcium 9.6 8.9 - 10.3 mg/dL    GFR calc non Af Amer >60 >60 mL/min    GFR calc Af Amer >60 >60 mL/min      Comment: (NOTE) The eGFR has been calculated using the CKD EPI equation. This calculation has not been validated in all clinical situations. eGFR's persistently <60 mL/min signify possible Chronic Kidney Disease.      Anion gap 7 5 - 15      Comment: Performed at [facility] [address], [place], [place] [address]       Imaging Results (Last 48 hours)  Dg Chest Port 1 View   Result Date: 07/08/2018 CLINICAL DATA:  Congestive heart failure. EXAM: PORTABLE CHEST 1 VIEW COMPARISON:  Radiograph of July 07, 2018. FINDINGS: Stable cardiomegaly. No pneumothorax or pleural effusion is noted. Both lungs are clear. The visualized skeletal structures are unremarkable. IMPRESSION: Stable cardiomegaly.  No acute abnormality seen. Electronically Signed   By: Marijo Conception, M.D.   On: 07/08/2018 10:55    Dg Swallowing Func-speech Pathology   Result Date: 07/08/2018 Objective Swallowing Evaluation: Type of Study: MBS-Modified Barium Swallow Study  Patient Details Name: Kaitlyn Underwood MRN: [id number] Date of Birth: Dec 05, 1940 Today's Date: 07/08/2018 Time: SLP Start Time (ACUTE ONLY): 4268 -SLP Stop Time (ACUTE ONLY): 1217 SLP Time Calculation (min) (ACUTE ONLY): 18 min Past Medical History: Past Medical History: Diagnosis Date . Anxiety  . Atrial  fibrillation ([place])  . Chronic systolic heart failure ([place])  . COPD (chronic obstructive pulmonary disease) ([place])  . Coronary artery disease  . Hypertension  . Hypothyroidism  . Left renal mass   unconfirmed documentation of "kidney cancer", possibly cyst . Pre-diabetes  Past Surgical History: Past Surgical History: Procedure Laterality Date . IR CT HEAD LTD  07/06/2018 . IR PERCUTANEOUS ART THROMBECTOMY/INFUSION INTRACRANIAL INC DIAG ANGIO  07/06/2018 . LEFT HEART CATH AND CORONARY  ANGIOGRAPHY N/A 04/29/2017  Procedure: LEFT HEART CATH AND CORONARY ANGIOGRAPHY;  Surgeon: Belva Crome, MD;  Location: [place] [facility];  Service: Cardiovascular;  Laterality: N/A; . RADIOLOGY WITH ANESTHESIA N/A 07/05/2018  Procedure: RADIOLOGY WITH ANESTHESIA;  Surgeon: Luanne Bras, MD;  Location: [facility];  Service: Radiology;  Laterality: N/A; . ULTRASOUND GUIDANCE FOR VASCULAR ACCESS  04/29/2017  Procedure: Ultrasound Guidance For Vascular Access;  Surgeon: Belva Crome, MD;  Location: [facility];  Service: Cardiovascular;; HPI: CASIE STURGEON is a 77 y.o. female with a history of HTN, hyperlipidemia, COPD, afib who presented with R weakness and aphasia. CTA showed occluded distal LT MCA - M2, had TPA and emergent thrombectomy at [facility]. ETT 11/17-11/19. Per MD note, pt has mild wheezing and secretions. CXR moderate enlargement of the cardiopericardial silhouette, without edema and degenerative glenohumeral arthropathy bilaterally. 11/18 MRI small to moderate evolving acute L MCA territory infarct, associated petechial hemorrhage without frank hemorrhagic transformation.  No data recorded Assessment / Plan / Recommendation CHL IP CLINICAL IMPRESSIONS 07/08/2018 Clinical Impression Pt presents with mod-servere oropharyngeal dysphagia marked by oral holding, reduced posterior propulsion, lingual and valleculuar residue, as well as penetration and aspiration of nectar and honey thick liquids. Pt's sensation of  penetrates/aspirates was significantly delayed, with a reflexive cough only triggering after multiple instances of aspiration. Lingual and vallecular residue from nectar and honey were consistently aspirated after the swallow and delayed arytenoid excursion to epiglottic petiole preventing complete laryngeal vestibule closure resulted in penetration of nectar and honey during the swallow. Pt's airway protection remained in tact during trials of puree, however reduced posterior propulsion of boluses and subsequent oral residue require pt to perform additional dry swallow for complete oral transit and clearance without pharyngeal residue. Recommend pt initiate puree diet, pudding thick liquids, meds crushed in puree with FULL supervision and assistance with feeding to ensure small bites/sips and verbal reminder for second swallow after each bite/sip. ST will continue to follow to provide treatment with diet safety, efficiency, compensatory strategy training, and potential for diet/liquid advancement. SLP Visit Diagnosis Dysphagia, oropharyngeal phase (R13.12) Attention and concentration deficit following -- Frontal lobe and executive function deficit following -- Impact on safety and function Severe aspiration risk   CHL IP TREATMENT RECOMMENDATION 07/08/2018 Treatment Recommendations Therapy as outlined in treatment plan below   Prognosis 07/08/2018 Prognosis for Safe Diet Advancement Fair Barriers to Reach Goals Language deficits;Severity of deficits Barriers/Prognosis Comment -- CHL IP DIET RECOMMENDATION 07/08/2018 SLP Diet Recommendations Dysphagia 1 (Puree) solids;Pudding thick liquid Liquid Administration via Spoon Medication Administration Crushed with puree Compensations Slow rate;Small sips/bites;Clear throat intermittently;Multiple dry swallows after each bite/sip;Minimize environmental distractions Postural Changes Seated upright at 90 degrees   CHL IP OTHER RECOMMENDATIONS 07/08/2018 Recommended Consults  -- Oral Care Recommendations Oral care BID Other Recommendations Order thickener from pharmacy;Remove water pitcher;Have oral suction available   CHL IP FOLLOW UP RECOMMENDATIONS 07/08/2018 Follow up Recommendations Skilled Nursing facility   [facility] IP FREQUENCY AND DURATION 07/08/2018 Speech Therapy Frequency (ACUTE ONLY) min 2x/week Treatment Duration 2 weeks      CHL IP ORAL PHASE 07/08/2018 Oral Phase Impaired Oral - Pudding Teaspoon -- Oral - Pudding Cup -- Oral - Honey Teaspoon -- Oral - Honey Cup Lingual pumping;Lingual/palatal residue;Delayed oral transit;Holding of bolus Oral - Nectar Teaspoon -- Oral - Nectar Cup Right anterior bolus loss;Holding of bolus;Lingual/palatal residue;Delayed oral transit Oral - Nectar Straw -- Oral - Thin Teaspoon -- Oral - Thin Cup -- Oral - Thin  Straw -- Oral - Puree Piecemeal swallowing;Weak lingual manipulation;Lingual pumping;Delayed oral transit;Reduced posterior propulsion;Holding of bolus Oral - Mech Soft -- Oral - Regular -- Oral - Multi-Consistency -- Oral - Pill -- Oral Phase - Comment --  CHL IP PHARYNGEAL PHASE 07/08/2018 Pharyngeal Phase Impaired Pharyngeal- Pudding Teaspoon -- Pharyngeal -- Pharyngeal- Pudding Cup -- Pharyngeal -- Pharyngeal- Honey Teaspoon -- Pharyngeal -- Pharyngeal- Honey Cup Penetration/Aspiration during swallow;Penetration/Apiration after swallow;Pharyngeal residue - valleculae;Pharyngeal residue - posterior pharnyx;Delayed swallow initiation-pyriform sinuses Pharyngeal Material enters airway, passes BELOW cords without attempt by patient to eject out (silent aspiration);Material enters airway, CONTACTS cords and not ejected out Pharyngeal- Nectar Teaspoon -- Pharyngeal -- Pharyngeal- Nectar Cup Penetration/Apiration after swallow;Pharyngeal residue - valleculae;Penetration/Aspiration during swallow;Reduced epiglottic inversion Pharyngeal Material enters airway, CONTACTS cords and not ejected out;Material enters airway, passes BELOW cords then  ejected out Pharyngeal- Nectar Straw -- Pharyngeal -- Pharyngeal- Thin Teaspoon -- Pharyngeal -- Pharyngeal- Thin Cup -- Pharyngeal -- Pharyngeal- Thin Straw -- Pharyngeal -- Pharyngeal- Puree WFL Pharyngeal -- Pharyngeal- Mechanical Soft -- Pharyngeal -- Pharyngeal- Regular -- Pharyngeal -- Pharyngeal- Multi-consistency -- Pharyngeal -- Pharyngeal- Pill -- Pharyngeal -- Pharyngeal Comment --  CHL IP CERVICAL ESOPHAGEAL PHASE 07/08/2018 Cervical Esophageal Phase WFL Pudding Teaspoon -- Pudding Cup -- Honey Teaspoon -- Honey Cup -- Nectar Teaspoon -- Nectar Cup -- Nectar Straw -- Thin Teaspoon -- Thin Cup -- Thin Straw -- Puree -- Mechanical Soft -- Regular -- Multi-consistency -- Pill -- Cervical Esophageal Comment -- [place] Siren 07/08/2018, 3:49 PM Orbie Pyo Litaker M.Ed Risk analyst [phone] Office [phone]                        Medical Problem List and Plan: 1.  Right side weakness with facial droop and aphasia secondary to left MCA infarction             -admit to inpatient rehab 2.  DVT Prophylaxis/Anticoagulation: Eliquis 3. Pain Management:  Tylenol as needed 4. Mood:  Zoloft 25 mg daily 5. Neuropsych: This patient is capable of making decisions on her own behalf. 6. Skin/Wound Care:  Routine skin checks 7. Fluids/Electrolytes/Nutrition:  Routine in and out's with follow-up chemistries 8. Dysphagia. Dysphagia #1 pudding thick liquids. Monitor hydration. Follow-up speech therapy             -BMET on admit 9. Hypertension. Toprol 50 mg daily 10. Atrial fibrillation. Lanoxin 0.125 mg daily. Cardiac rate controlled 11.COPD.Continue nebulizers 12.Chronic systolic congestive heart failure.Monitor for any signs of fluid overload             -daily weights 13. Hyperlipidemia. Pravachol 14. Pre-diabetes. Hemoglobin A1c 5.6. SSI     Post Admission Physician Evaluation: 1. Functional deficits secondary  to left MCA infarct. 2. Patient is admitted to  receive collaborative, interdisciplinary care between the physiatrist, rehab nursing staff, and therapy team. 3. Patient's level of medical complexity and substantial therapy needs in context of that medical necessity cannot be provided at a lesser intensity of care such as a SNF. 4. Patient has experienced substantial functional loss from his/her baseline which was documented above under the "Functional History" and "Functional Status" headings.  Judging by the patient's diagnosis, physical exam, and functional history, the patient has potential for functional progress which will result in measurable gains while on inpatient rehab.  These gains will be of substantial and practical use upon discharge  in facilitating mobility and self-care at the household level. 5. Physiatrist will provide 24  hour management of medical needs as well as oversight of the therapy plan/treatment and provide guidance as appropriate regarding the interaction of the two. 6. The Preadmission Screening has been reviewed and patient status is unchanged unless otherwise stated above. 7. 24 hour rehab nursing will assist with bladder management, bowel management, safety, skin/wound care, disease management, medication administration, pain management and patient education  and help integrate therapy concepts, techniques,education, etc. 8. PT will assess and treat for/with: Lower extremity strength, range of motion, stamina, balance, functional mobility, safety, adaptive techniques and equipment, NMR, visual-spatial awareness, family ed.   Goals are: supervision to min assist. 9. OT will assess and treat for/with: ADL's, functional mobility, safety, upper extremity strength, adaptive techniques and equipment, NMR, family ed.   Goals are: supervision to min assist. Therapy may proceed with showering this patient. 10. SLP will assess and treat for/with: cognition, language, swallowing.  Goals are: supervision to min assist. 11. Case  Management and Social Worker will assess and treat for psychological issues and discharge planning. 12. Team conference will be held weekly to assess progress toward goals and to determine barriers to discharge. 13. Patient will receive at least 3 hours of therapy per day at least 5 days per week. 14. ELOS: 18-25 days       15. Prognosis:  excellent     I have personally performed a face to face diagnostic evaluation of this patient and formulated the key components of the plan.  Additionally, I have personally reviewed laboratory data, imaging studies, as well as relevant notes and concur with the physician assistant's documentation above.   Meredith Staggers, MD, FAAPMR     Lavon Paganini Speculator, PA-C 07/10/2018  The patient's status has not changed. The original post admission physician evaluation remains appropriate, and any changes from the pre-admission screening or documentation from the acute chart are noted above.  Meredith Staggers, MD 07/10/2018

## 2018-07-10 NOTE — Progress Notes (Addendum)
Husband bedside. Personal items such as eyeglasses and teddy bears are traveling to 4w19  with patient. No complaints of pain or further needs at this time

## 2018-07-10 NOTE — Progress Notes (Signed)
Physical Therapy Treatment Patient Details Name: Kaitlyn Underwood MRN: [id number] DOB: 10-Apr-1941 Today's Date: 07/10/2018    History of Present Illness 77 y.o. female admitted on 07/05/18 for R sided weakness and aphasia.  Pt treated for possible stroke after CT was negative for hemorrhage with IV tPA and underwent Lt common carotid arteriogram followed by complete revascularization of occluded Lt MCA.  MRI revealed L MCA infarct and associated petechial hemmorrhage.  Pt with significant PMH of pre-diabetes, HRN, CAD, COPD, chronic systolic heart failure, and A-fib.                                                                                          PT Comments    Pt with increased attention, mobility and function today. Pt required less assist to stand and was able to advance mobility to gait today with decreased physical assist. Daughter and granddaughter present during session and able to provide PLOF and home setup. Pt with continued progression of function and mobility and remains great CIR candidate.     Follow Up Recommendations  CIR     Equipment Recommendations  None recommended by PT    Recommendations for Other Services       Precautions / Restrictions Precautions Precautions: Fall Precaution Comments: left gaze preference, right inattention Restrictions Weight Bearing Restrictions: No    Mobility  Bed Mobility               General bed mobility comments: in chair on arrival   Transfers Overall transfer level: Needs assistance   Transfers: Sit to/from Stand Sit to Stand: Min assist         General transfer comment: cues for hand placement with tactile cues to rise and increased time to stabilize in standing.   Ambulation/Gait Ambulation/Gait assistance: Min assist;+2 safety/equipment Gait Distance (Feet): 60 Feet Assistive device: Rolling walker (2 wheeled) Gait Pattern/deviations: Step-through pattern;Decreased stride length;Trunk flexed      General Gait Details: cues for safety, posture, position in RW directing RW toward left and attending to right. Pt veering Right with RW despite therapist on that side and cues, chair to follow with fatigue limiting gait. Per family does not walk long distances at baseline.    Stairs             Wheelchair Mobility    Modified Rankin (Stroke Patients Only) Modified Rankin (Stroke Patients Only) Pre-Morbid Rankin Score: Moderate disability Modified Rankin: Moderately severe disability     Balance Overall balance assessment: Needs assistance   Sitting balance-Leahy Scale: Good     Standing balance support: Bilateral upper extremity supported Standing balance-Leahy Scale: Poor Standing balance comment: reliant on external support                            Cognition Arousal/Alertness: Awake/alert Behavior During Therapy: WFL for tasks assessed/performed Overall Cognitive Status: Impaired/Different from baseline Area of Impairment: Attention;Safety/judgement;Problem solving;Orientation                 Orientation Level: Disoriented to;Time Current Attention Level: Sustained Memory: Decreased recall of precautions Following Commands: Follows  one step commands with increased time Safety/Judgement: Decreased awareness of safety;Decreased awareness of deficits   Problem Solving: Requires verbal cues;Requires tactile cues General Comments: right inattention with aphasia, stating it is monday, unable to state month after education. Difficulty recalling who granddaughter is and required increased time      Exercises      General Comments        Pertinent Vitals/Pain Pain Assessment: No/denies pain    Home Living Family/patient expects to be discharged to:: Private residence Living Arrangements: Children Available Help at Discharge: Family;Available PRN/intermittently Type of Home: House Home Access: Stairs to enter   Home Layout: One  level Home Equipment: Environmental consultant - 2 wheels;Shower seat      Prior Function Level of Independence: Needs assistance  Gait / Transfers Assistance Needed: uses RW for gait and can walk in stores but WC for mall or larger community distances ADL's / Homemaking Assistance Needed: she bathes and dresses with seating rests and family does the homemaking, does not drive     PT Goals (current goals can now be found in the care plan section) Progress towards PT goals: Progressing toward goals    Frequency           PT Plan Current plan remains appropriate    Co-evaluation PT/OT/SLP Co-Evaluation/Treatment: Yes Reason for Co-Treatment: Complexity of the patient's impairments (multi-system involvement);For patient/therapist safety;Necessary to address cognition/behavior during functional activity PT goals addressed during session: Mobility/safety with mobility;Balance;Proper use of DME        AM-PAC PT "6 Clicks" Daily Activity  Outcome Measure  Difficulty turning over in bed (including adjusting bedclothes, sheets and blankets)?: A Lot Difficulty moving from lying on back to sitting on the side of the bed? : Unable Difficulty sitting down on and standing up from a chair with arms (e.g., wheelchair, bedside commode, etc,.)?: Unable Help needed moving to and from a bed to chair (including a wheelchair)?: A Little Help needed walking in hospital room?: A Little Help needed climbing 3-5 steps with a railing? : Total 6 Click Score: 11    End of Session Equipment Utilized During Treatment: Gait belt Activity Tolerance: Patient limited by fatigue Patient left: in chair;with chair alarm set;with family/visitor present Nurse Communication: Mobility status PT Visit Diagnosis: Muscle weakness (generalized) (M62.81);Difficulty in walking, not elsewhere classified (R26.2);Hemiplegia and hemiparesis;Other symptoms and signs involving the nervous system (R29.898) Hemiplegia - caused by: Cerebral  infarction     Time: 2993-7169 PT Time Calculation (min) (ACUTE ONLY): 24 min  Charges:  $Gait Training: 8-22 mins                     [facility] Pager: [phone] Office: [phone]    Sandy Salaam Carren Blakley 07/10/2018, 11:41 AM

## 2018-07-10 NOTE — Discharge Summary (Addendum)
Stroke Discharge Summary  Patient ID: Kaitlyn Underwood   MRN: [id number]      DOB: 1940/11/14  Date of Admission: 07/05/2018 Date of Discharge: 07/10/2018  Attending Physician:  Kaitlyn Hawking, MD, Stroke MD Consultant(s):   CCM - Dr Kaitlyn Underwood ; Rehab. Dr Kaitlyn Underwood Patient's PCP:  Kaitlyn Dress, MD  Discharge Diagnoses:  Acute Left MCA Territory infarct treated with thrombectomy of occluded inferior division of the superior duplicated left middle cerebral artery with TICI3 reperfusion  Principal Problem:   Acute ischemic left MCA stroke ([place])   Chronic atrial fibrillation   Coronary artery disease involving native coronary artery of native heart without angina pectoris   Carotid stenosis, left   Prediabetes   Chronic systolic heart failure (HCC)   CKD (chronic kidney disease), stage III (HCC)   COPD (chronic obstructive pulmonary disease) ([place])  Past Medical History:  Diagnosis Date  . Anxiety   . Atrial fibrillation ([place])   . Chronic systolic heart failure ([place])   . COPD (chronic obstructive pulmonary disease) ([place])   . Coronary artery disease   . Hypertension   . Hypothyroidism   . Left renal mass    unconfirmed documentation of "kidney cancer", possibly cyst  . Pre-diabetes    Past Surgical History:  Procedure Laterality Date  . IR CT HEAD LTD  07/06/2018  . IR PERCUTANEOUS ART THROMBECTOMY/INFUSION INTRACRANIAL INC DIAG ANGIO  07/06/2018  . LEFT HEART CATH AND CORONARY ANGIOGRAPHY N/A 04/29/2017   Procedure: LEFT HEART CATH AND CORONARY ANGIOGRAPHY;  Surgeon: Kaitlyn Crome, MD;  Location: [facility];  Service: Cardiovascular;  Laterality: N/A;  . RADIOLOGY WITH ANESTHESIA N/A 07/05/2018   Procedure: RADIOLOGY WITH ANESTHESIA;  Surgeon: Kaitlyn Bras, MD;  Location: [facility];  Service: Radiology;  Laterality: N/A;  . ULTRASOUND GUIDANCE FOR VASCULAR ACCESS  04/29/2017   Procedure: Ultrasound Guidance For Vascular Access;  Surgeon: Kaitlyn Crome, MD;  Location: [facility];  Service: Cardiovascular;;      LABORATORY STUDIES CBC    Component Value Date/Time   WBC 8.6 07/10/2018 0403   RBC 3.44 (L) 07/10/2018 0403   HGB 10.3 (L) 07/10/2018 0403   HGB 13.7 05/12/2018 1212   HCT 33.6 (L) 07/10/2018 0403   HCT 40.8 05/12/2018 1212   PLT 377 07/10/2018 0403   PLT 414 05/12/2018 1212   MCV 97.7 07/10/2018 0403   MCV 94 05/12/2018 1212   MCH 29.9 07/10/2018 0403   MCHC 30.7 07/10/2018 0403   RDW 14.0 07/10/2018 0403   RDW 13.6 05/12/2018 1212   LYMPHSABS 2.1 07/06/2018 0703   LYMPHSABS 3.3 (H) 05/12/2018 1212   MONOABS 0.7 07/06/2018 0703   EOSABS 0.0 07/06/2018 0703   EOSABS 0.2 05/12/2018 1212   BASOSABS 0.0 07/06/2018 0703   BASOSABS 0.1 05/12/2018 1212   CMP    Component Value Date/Time   NA 143 07/10/2018 0403   NA 141 05/12/2018 1212   K 4.0 07/10/2018 0403   CL 113 (H) 07/10/2018 0403   CO2 23 07/10/2018 0403   GLUCOSE 88 07/10/2018 0403   BUN 15 07/10/2018 0403   BUN 25 05/12/2018 1212   CREATININE 0.88 07/10/2018 0403   CALCIUM 9.6 07/10/2018 0403   PROT 6.6 05/12/2018 1212   ALBUMIN 4.3 05/12/2018 1212   AST 25 05/12/2018 1212   ALT 19 05/12/2018 1212   ALKPHOS 119 (H) 05/12/2018 1212   BILITOT 0.7 05/12/2018 1212   GFRNONAA >60 07/10/2018  0403   GFRAA >60 07/10/2018 0403   COAGS Lab Results  Component Value Date   INR 1.0 04/22/2017   Lipid Panel    Component Value Date/Time   CHOL 146 07-20-2018 0704   CHOL 189 05/12/2018 1212   TRIG 124 07-20-2018 0704   HDL 49 07/20/18 0704   HDL 63 05/12/2018 1212   CHOLHDL 3.0 07-20-18 0704   VLDL 25 07-20-18 0704   LDLCALC 72 2018-07-20 0704   LDLCALC 103 (H) 05/12/2018 1212   HgbA1C  Lab Results  Component Value Date   HGBA1C 5.6 July 20, 2018   Urinalysis No results found for: COLORURINE, APPEARANCEUR, LABSPEC, PHURINE, GLUCOSEU, HGBUR, BILIRUBINUR, KETONESUR, PROTEINUR, UROBILINOGEN, NITRITE, LEUKOCYTESUR Urine Drug Screen No results found for:  LABOPIA, COCAINSCRNUR, LABBENZ, AMPHETMU, THCU, LABBARB  Alcohol Level No results found for: [facility]   SIGNIFICANT DIAGNOSTIC STUDIES  Mr Kaitlyn Underwood Head Wo Contrast Jul 20, 2018 IMPRESSION:   MRI HEAD  1. Small to moderate evolving acute left MCA territory infarct as above. Associated petechial hemorrhage without frank hemorrhagic transformation or significant mass effect.  2. Underlying moderate chronic microvascular ischemic disease.   MRA HEAD  1. Negative intracranial MRA. No evidence for large vessel occlusion status post catheter directed thrombectomy. No hemodynamically significant or correctable stenosis identified.  2. Duplicated left M1 segment.    [place] 07/07/2018 IMPRESSION:  Status post endovascular complete revascularization of occluded inferior division of the superior duplicated left middle cerebral artery with 2 passes with the 3 mm x 20 mm Trevo ProVue retrieval device, and 1 pass with the Embotrap 5 mm x 33 mm retrieval device achieving a revascularization.    Dg Chest Port 1 View 07/07/2018 IMPRESSION:  1. Moderate enlargement of the cardiopericardial silhouette, without edema.  2. Degenerative glenohumeral arthropathy bilaterally.    Portable Chest X-ray 07-20-2018 IMPRESSION:  1. Endotracheal tube tip at the level of the clavicular heads.  2. Cardiomegaly.    Dg Abd Portable 1v 07/20/2018 IMPRESSION:  OG tube tip in the expected location of the gastroduodenal junction.    Carotid Dopplers (at [place] Only) 07/07/2018 Summary:  Right Carotid: Velocities in the right ICA are consistent with a 1-39% stenosis.  Left Carotid: Velocities in the left ICA are consistent with a 40-59% stenosis.  Vertebrals: Bilateral vertebral arteries demonstrate antegrade flow.    CXR 07/08/18 IMPRESSION: Stable cardiomegaly. No acute abnormality seen.   Transthoracic Echocardiogram 07/07/2018 Study Conclusions - Left ventricle: The cavity size was  moderately dilated. There was   mild focal basal hypertrophy of the septum. Systolic function was   severely reduced. The estimated ejection fraction was in the   range of 25% to 30%. Diffuse hypokinesis and dyskinesis of the   basal and mid inferoseptal, anteroseptal and apical septal walls.   The study was not technically sufficient to allow evaluation of   LV diastolic dysfunction due to atrial fibrillation. - Mitral valve: There was severe regurgitation directed   posteriorly. - Left atrium: The atrium was moderately dilated. - Right ventricle: The cavity size was moderately dilated. Wall   thickness was normal. Systolic function was mildly reduced. - Right atrium: The atrium was moderately dilated. - Pulmonary arteries: Systolic pressure was moderately increased.   PA peak pressure: 57 mm Hg (S). - Inferior vena cava: The vessel was normal in size. - Pericardium, extracardiac: There was no pericardial effusion. Impressions: - Severe LV systolic dysfunction, moderate dilatation. Severe   mitral regurgitation that is functional. Mild systolic RV  dysfunction. Moderate pulmonary hypertension.     HISTORY OF PRESENT ILLNESS (From Dr Cecil Cobbs Admission H&P) Kaitlyn Underwood is a 77 y.o. female with a history of hypertension and hyperlipidemia who presents with right-sided weakness and aphasia.  She was last known well was sometime between 530 and 6 PM.  She presented to [facility] densely aphasic and was treated with IV TPA.  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  She was transferred to [facility] for emergent thrombectomy. There was difficulty establishing reperfusion, but on the third pass tici 3 flow was obtained.  LKW: 5:30 PM tpa given?: no, outside of window Modified Rankin Scale: 0-Completely asymptomatic and back to baseline post- stroke    HOSPITAL COURSE [name] is a 77 y.o. female with history of  afib not on AC, CAD, HTN, HLD, DM and obsity admitted for aphasia and right sided weakness. TPA given and s/p thrombectomy.    Stroke:  left MCA infarct with left M2 occlusion s/p tPA and IR with TICI3 reperfusion, embolic secondary to Afib not on [facility]  Resultant right hemiparesis, expressive aphasia  CTA head and neck - duplicated left MCAs with left M2/M3 occlusion  DSA left M2/M3 occlusion s/p TICI3 reperfusion  MRI left MCA small to moderate infarcts   MRA unremarkable, duplicated MCA  CUS left ICA 40-59% stenosis  2D Echo EF 25-30% down from 04/2017 with 35-40%  LDL 72  HgbA1c 5.6  SCDs for VTE prophylaxis  aspirin 325 mg daily prior to admission, now on eliquis for stroke prevention  Ongoing aggressive stroke risk factor management  Therapy recommendations:  CIR  Disposition: Discharged to inpt rehab  CHF  EF 25-30% down from 35-40% one year ago  CCM on board  received IV lasix 40 07/07/18  Gentle hydration with NS @ 30 cc  Continue digoxin and metoprolol  CXR cardiomegaly but no CHF  Continue tele monitoring  Chronic afib on ASA   Followed with Dr. Geraldo Pitter  Not on [facility] due to back surgery with unsteady gait  On ASA 325 PTA  Rate controlled  On digoxin and metoprolol PTA  Now on eliquis for stroke prevention   Spiking fever, resolved  Tmax 101.2->afebrile-> afebrile  CXR 07/07/18 - Moderate enlargement of the cardiopericardial silhouette, without edema.  CXR stable cardiomegaly, no acute abnormality  Diabetes  HgbA1c 5.6 goal < 7.0  Controlled  CBG monitoring  SSI  Hypertension  Stable  BP goal < 160  Long term BP goal normotensive  Hyperlipidemia  Home meds:  none   LDL 72, goal < 70  On pravastatin low dose  Continue statin on discharge  Dysphagia   On pudding thick liquid  Speech on board  Gentle hydration due to decreased fluid intake  Other Stroke Risk Factors  Advanced age  Obesity, Body  mass index is 36.58 kg/m.   Coronary artery disease  Other Active Problems  Depression on zoloft     DISCHARGE EXAM Blood pressure (!) 148/83, pulse 66, temperature 98.2 F (36.8 C), temperature source Oral, resp. rate 17, height 5\' 2"  (1.575 m), weight 90.7 kg, SpO2 97 %.  General - Well nourished, well developed, not in acute distress  Ophthalmologic - fundi not visualized due to noncooperation.  Cardiovascular - irregularly irregular heart rate and rhythm.  Neuro - awake alert, sitting in chair, eyes open and smiling to provider, and following simple commands on both hands.  Still has mild expressive aphasia but much improved from yesterday, able to name 2 out of 3 and repeat simple sentences, able to answer questions with short answers, orientated to self and age. Eyes move in both directions, no forced gaze. Right facial droop, tongue midline, blinking to visual threat bilaterally. LUE and LLE 4+/5, RUE 4/5 proximal but 0/5 distal finger movement. RLE 3+/5 proximal and 4/5 distally. DTR 1+ and positive babinski on the right. Sensation and coordination not cooperative. Gait not tested.    Discharge Diet   Diet Order            DIET - DYS 1 Room service appropriate? Yes; Fluid consistency: Pudding Thick  Diet effective now             liquids  DISCHARGE PLAN  Disposition:  Transfer to [facility] for ongoing PT, OT and ST  Eliquis 5 mg BID for secondary stroke prevention.  Recommend ongoing risk factor control by Primary Care Physician at time of discharge from inpatient rehabilitation.  Follow-up Kaitlyn Dress, MD in 2 weeks following discharge from rehab.  Follow-up in [facility] in 4 weeks following discharge from rehab, office to schedule an appointment.   35 minutes were spent preparing discharge.  Kaitlyn Hawking, MD PhD Stroke Neurology 07/10/2018 10:37 PM

## 2018-07-10 NOTE — PMR Pre-admission (Signed)
PMR Admission Coordinator Pre-Admission Assessment  Patient: Kaitlyn Underwood is an 77 y.o., female MRN: [id number] DOB: [birth year] Height: '5\' 2"'$  (157.5 cm) Weight: 90.7 kg              Insurance Information HMO:     PPO: Yes     PCP:       IPA:       80/20:       OTHER:   PRIMARY: [employer]      Policy#: [id number]      Subscriber: patient CM Name: RN has epic access and can get clinicals      Phone#: [phone]     Fax#: [phone] Pre-Cert#: [id number]      Employer: Not employed Benefits:  Phone #: [phone]     Name: On line portal Eff. Date: 08/19/17     Deduct:  $0      Out of Pocket Max: $3100 (met $239.95)      Life Max: N/A CIR: $250 day 1; $125 days 2-6      SNF: $0 days 1-20; $160 days 21-100 Outpatient:  Medical necessity     Co-Pay: $10/visit Home Health: 100%      Co-Pay: none DME: 80%     Co-Pay: 20% Providers: in network  Medicaid Application Date:        Case Manager:   Disability Application Date:        Case Worker:    Emergency Contact Information Contact Information    Name Relation Home Work Mobile   [name] Daughter   [phone]   Annya, Lizana Spouse [phone]       Current Medical History  Patient Admitting Diagnosis:  L MCA infarct  History of Present Illness: A 77 y.o.right handed female with history of hypertension, hyperlipidemia, chronic systolic congestive heart failure, COPD, CAD maintained on aspirin. Patient reportedly lives with spouse.[facility] 07/06/2018 with right-sided weakness and aphasia. CT/CTA outside hospital showed occlusion of a substantial vessel in the left MCA distribution. Patient didn't receive TPA and was transferred to [facility] for further evaluation. MRI the brain showed small to moderate evolving acute left MCA territory infarction with associated petechial hemorrhage without frank hemorrhagic transformation. Negative MRA. Underwent revascularization thrombectomy per  interventional radiology of left M2 occlusion. EEG negative for seizure. Echocardiogram with ejection fraction of 30% diffuse hypokinesis. Dysphagia #1 pudding thick liquid diet. Currently maintained on Eliquis for CVA prophylaxis. Therapy evaluations completed with recommendations of physical medicine rehabilitation consult.  Complete NIHSS TOTAL: 6  Past Medical History  Past Medical History:  Diagnosis Date  . Anxiety   . Atrial fibrillation ([place])   . Chronic systolic heart failure ([place])   . COPD (chronic obstructive pulmonary disease) ([place])   . Coronary artery disease   . Hypertension   . Hypothyroidism   . Left renal mass    unconfirmed documentation of "kidney cancer", possibly cyst  . Pre-diabetes     Family History  family history includes Arrhythmia in her sister; Heart attack in her maternal uncle; Heart disease in her brother.  Prior Rehab/Hospitalizations: Had 20 outpatient visits for weakness in 01/19  Has the patient had major surgery during 100 days prior to admission? No  Current Medications   Current Facility-Administered Medications:  .  0.9 %  sodium chloride infusion, , Intravenous, Continuous, Rosalin Hawking, MD .  acetaminophen (TYLENOL) tablet 650 mg, 650 mg, Oral, Q4H PRN **OR** acetaminophen (TYLENOL) solution 650 mg, 650 mg, Per Tube, Q4H  PRN **OR** acetaminophen (TYLENOL) suppository 650 mg, 650 mg, Rectal, Q4H PRN, Rosalin Hawking, MD .  albuterol (PROVENTIL) (2.5 MG/3ML) 0.083% nebulizer solution 2.5 mg, 2.5 mg, Nebulization, Q2H PRN, Renee Pain, MD .  apixaban (ELIQUIS) tablet 5 mg, 5 mg, Oral, BID, Rosalin Hawking, MD, 5 mg at 07/10/18 1033 .  budesonide (PULMICORT) nebulizer solution 0.5 mg, 0.5 mg, Nebulization, BID, Jennelle Human B, NP, 0.5 mg at 07/10/18 0736 .  chlorhexidine (PERIDEX) 0.12 % solution 15 mL, 15 mL, Mouth Rinse, BID, Rosalin Hawking, MD, 15 mL at 07/10/18 1035 .  digoxin (LANOXIN) tablet 0.125 mg, 0.125 mg, Oral, Daily, Rosalin Hawking, MD,  0.125 mg at 07/10/18 1048 .  docusate (COLACE) 50 MG/5ML liquid 100 mg, 100 mg, Oral, BID PRN, Rosalin Hawking, MD .  insulin aspart (novoLOG) injection 0-15 Units, 0-15 Units, Subcutaneous, TID PC & HS, Rosalin Hawking, MD .  ipratropium-albuterol (DUONEB) 0.5-2.5 (3) MG/3ML nebulizer solution 3 mL, 3 mL, Nebulization, BID, Rosalin Hawking, MD, 3 mL at 07/10/18 0737 .  labetalol (NORMODYNE,TRANDATE) injection 10 mg, 10 mg, Intravenous, Once PRN **AND** [DISCONTINUED] nicardipine (CARDENE) '20mg'$  in 0.86% saline 292m IV infusion (0.1 mg/ml), 0-15 mg/hr, Intravenous, Continuous PRN, KGreta Doom MD .  MEDLINE mouth rinse, 15 mL, Mouth Rinse, q12n4p, XRosalin Hawking MD, 15 mL at 07/09/18 1810 .  metoprolol succinate (TOPROL-XL) 24 hr tablet 50 mg, 50 mg, Oral, Daily, Agarwala, Ravi, MD, 50 mg at 07/10/18 1034 .  montelukast (SINGULAIR) tablet 10 mg, 10 mg, Per Tube, QHS, SJennelle HumanB, NP, 10 mg at 07/09/18 2113 .  pantoprazole (PROTONIX) EC tablet 40 mg, 40 mg, Oral, Q1200, BErick Colace NP, 40 mg at 07/10/18 1259 .  pravastatin (PRAVACHOL) tablet 20 mg, 20 mg, Oral, q1800, XRosalin Hawking MD, 20 mg at 07/09/18 1809 .  RESOURCE THICKENUP CLEAR, , Oral, PRN, XRosalin Hawking MD .  sertraline (ZOLOFT) tablet 25 mg, 25 mg, Oral, Daily, BErick Colace NP, 25 mg at 07/10/18 1033  Patients Current Diet:  Diet Order            DIET - DYS 1 Room service appropriate? Yes; Fluid consistency: Pudding Thick  Diet effective now              Precautions / Restrictions Precautions Precautions: Fall Precaution Comments: left gaze preference, right inattention Restrictions Weight Bearing Restrictions: No   Has the patient had 2 or more falls or a fall with injury in the past year?Yes.  Daughter reports 4 falls with bruising/ minor injury.  Prior Activity Level Limited Community (1-2x/wk): Went out 2-3 X a week, not working, not driving.  Home Assistive Devices / Equipment Home Equipment: Walker - 2  wheels, Shower seat  Prior Device Use: Indicate devices/aids used by the patient prior to current illness, exacerbation or injury? Walker and furniture walks  Prior Functional Level Prior Function Level of Independence: Needs assistance Gait / Transfers Assistance Needed: uses RW for gait and can walk in stores but WC for mall or larger community distances ADL's / Homemaking Assistance Needed: she bathes and dresses with seating rests and family does the homemaking, does not drive Comments: assumed independent, chart did not reveal any prior therapy, no family present to report.   Self Care: Did the patient need help bathing, dressing, using the toilet or eating?  Independent  Indoor Mobility: Did the patient need assistance with walking from room to room (with or without device)? Independent  Stairs: Did the patient need assistance with internal  or external stairs (with or without device)? Independent  Functional Cognition: Did the patient need help planning regular tasks such as shopping or remembering to take medications? Independent  Current Functional Level Cognition  Arousal/Alertness: Awake/alert Overall Cognitive Status: Impaired/Different from baseline Current Attention Level: Sustained Orientation Level: Oriented X4 Following Commands: Follows one step commands with increased time Safety/Judgement: Decreased awareness of safety, Decreased awareness of deficits General Comments: right inattention with aphasia, stating it is monday, unable to state month after education. Difficulty recalling who granddaughter is and required increased time Attention: Focused, Sustained Focused Attention: Appears intact Sustained Attention: Appears intact Memory: (further assess) Awareness: (further assess) Problem Solving: (further assess) Executive Function: Initiating Initiating: Impaired(able to initiate suctioning) Initiating Impairment: Verbal basic Behaviors:  Lability Safety/Judgment: Impaired    Extremity Assessment (includes Sensation/Coordination)  Upper Extremity Assessment: RUE deficits/detail RUE Deficits / Details: using funcitonally wiht isolated joint movment; generalized weakness with decreased coordination RUE Coordination: decreased fine motor, decreased gross motor  Lower Extremity Assessment: Defer to PT evaluation RLE Deficits / Details: right leg with functional weakness as observed when moving to EOB (unable to lift R leg significantly against gravity to progress it to EOB without assistance, some instability in standing and stepping around having difficulty progressing it.      ADLs  Overall ADL's : Needs assistance/impaired Grooming: Moderate assistance, Brushing hair Upper Body Bathing: Moderate assistance, Sitting Lower Body Bathing: Maximal assistance, +2 for physical assistance, Sit to/from stand Upper Body Dressing : Moderate assistance, Sitting Lower Body Dressing: +2 for physical assistance, Sit to/from stand, Total assistance Toilet Transfer: Minimal assistance, Ambulation, RW, Grab bars, Comfort height toilet Toilet Transfer Details (indicate cue type and reason): more difficulty with spatial management with turns Toileting- Clothing Manipulation and Hygiene: Moderate assistance Functional mobility during ADLs: Rolling walker, Cueing for safety, Moderate assistance(running RW into wall but able to correct once wall was hit) General ADL Comments: limited by aphasia, cognition, R sided weakness, and impaired balance     Mobility  Overal bed mobility: Needs Assistance Bed Mobility: Supine to Sit Supine to sit: Mod assist, HOB elevated General bed mobility comments: in chair on arrival     Transfers  Overall transfer level: Needs assistance Equipment used: Rolling walker (2 wheeled) Transfers: Sit to/from Stand Sit to Stand: Min assist Stand pivot transfers: Mod assist General transfer comment: increased  assistance required; vc to position sefl inside of RW    Ambulation / Gait / Stairs / Wheelchair Mobility  Ambulation/Gait Ambulation/Gait assistance: Min assist, +2 safety/equipment Gait Distance (Feet): 60 Feet Assistive device: Rolling walker (2 wheeled) Gait Pattern/deviations: Step-through pattern, Decreased stride length, Trunk flexed General Gait Details: cues for safety, posture, position in RW directing RW toward left and attending to right. Pt veering Right with RW despite therapist on that side and cues, chair to follow with fatigue limiting gait. Per family does not walk long distances at baseline.     Posture / Balance Dynamic Sitting Balance Sitting balance - Comments:  close S and cues due to R lateral lean, anterior lean Balance Overall balance assessment: Needs assistance Sitting-balance support: Feet unsupported Sitting balance-Leahy Scale: Good Sitting balance - Comments:  close S and cues due to R lateral lean, anterior lean Standing balance support: Bilateral upper extremity supported Standing balance-Leahy Scale: Poor Standing balance comment: reliant on external support    Special needs/care consideration BiPAP/CPAP No CPM No Continuous Drip IV No Dialysis No        Life Vest No Oxygen  No Special Bed No Trach Size No Wound Vac (area) No       Skin No                           Bowel mgmt: Last BM 07/08/18 Bladder mgmt: Has purwick external catheter in place Diabetic mgmt:  Is prediabetic, has been on insulin in acute hospital setting    Previous Home Environment Living Arrangements: Children  Lives With: Pincus Badder) Available Help at Discharge: Family, Available PRN/intermittently Type of Home: House Home Layout: One level Home Access: Stairs to enter [employer] of Steps: 2 Bathroom Toilet: Standard Additional Comments: Pt unable to report due to severe expressive aphasia.    Discharge Living Setting Plans for Discharge Living Setting:  Patient's home, House, Lives with (comment)(Lives with husband.) Type of Home at Discharge: Mobile home(Double wide mobile home.) Discharge Home Layout: One level(Has 1 step down to bedroom) Discharge Home Access: Stairs to enter Entrance Stairs-Number of Steps: 2 step entry Discharge Bathroom Shower/Tub: Tub/shower unit, Curtain Discharge Bathroom Toilet: Standard Discharge Bathroom Accessibility: No Does the patient have any problems obtaining your medications?: No  Social/Family/Support Systems Patient Roles: Spouse, Parent, Other (Comment)(Has a husband, 5 daughters, 1 grand daughter.) Contact Information: [name] - daughter - [phone] Anticipated Caregiver: All five daughters and grand daughter Ability/Limitations of Caregiver: Dtr Maudie Mercury lives next door and not working.  Dtr Santiago Glad in [place].  Dtr Juliann Pulse is first call and coordinates care.  Husband defers to his daughters.  Angie daughter is in [place]. Caregiver Availability: 24/7 Discharge Plan Discussed with Primary Caregiver: Yes Is Caregiver In Agreement with Plan?: Yes Does Caregiver/Family have Issues with Lodging/Transportation while Pt is in Rehab?: No  Goals/Additional Needs Patient/Family Goal for Rehab: PT/OT/SLP min assist goals Expected length of stay: 18-25 days Cultural Considerations: [employer] Dietary Needs: Dys 1, pudding thick liquids Equipment Needs: TBD Pt/Family Agrees to Admission and willing to participate: Yes Program Orientation Provided & Reviewed with Pt/Caregiver Including Roles  & Responsibilities: Yes  Decrease burden of Care through IP rehab admission: N/A  Possible need for SNF placement upon discharge: Not anticipated  Patient Condition: This patient's condition remains as documented in the consult dated 07/09/18, in which the Rehabilitation Physician determined and documented that the patient's condition is appropriate for intensive rehabilitative care in an inpatient rehabilitation  facility. Will admit to inpatient rehab today.  Preadmission Screen Completed By:  Retta Diones, 07/10/2018 1:37 PM ______________________________________________________________________   Discussed status with Dr. Naaman Plummer on 07/10/18 at 1337 and received telephone approval for admission today.  Admission Coordinator:  Retta Diones, time 1337/Date 07/10/18

## 2018-07-10 NOTE — Progress Notes (Signed)
STROKE TEAM PROGRESS NOTE   SUBJECTIVE (INTERVAL HISTORY) Her RN is at the bedside. Pt sitting in chair, comfortably. No SOB or distress.  No more fever.  Speech much improved, able to name and repeat simple sentences and able to answer questions with very short answers.  Pending CIR today   OBJECTIVE Temp:  [98.1 F (36.7 C)-98.4 F (36.9 C)] 98.4 F (36.9 C) (11/22 0400) Pulse Rate:  [53-68] 58 (11/22 0400) Cardiac Rhythm: Atrial fibrillation (11/22 0000) Resp:  [13-25] 17 (11/22 0400) BP: (137-158)/(52-129) 158/82 (11/22 0400) SpO2:  [97 %-99 %] 97 % (11/22 0400)  Recent Labs  Lab 07/09/18 1544 07/09/18 1941 07/09/18 2332 07/10/18 0323 07/10/18 0745  GLUCAP 130* 122* 99 100* 97   Recent Labs  Lab 07/06/18 0703 07/07/18 1140 07/08/18 0508 07/09/18 0524 07/10/18 0403  NA 140 142 143 143 143  K 3.8 4.2 3.5 4.1 4.0  CL 111 114* 111 113* 113*  CO2 24 20* 24 22 23   GLUCOSE 101* 103* 90 101* 88  BUN 9 9 10 15 15   CREATININE 0.89 0.92 0.94 0.96 0.88  CALCIUM 8.7* 9.0 9.3 9.7 9.6  MG 1.8  --   --   --   --   PHOS 3.7  --   --   --   --    No results for input(s): AST, ALT, ALKPHOS, BILITOT, PROT, ALBUMIN in the last 168 hours. Recent Labs  Lab 07/06/18 0703 07/07/18 1140 07/08/18 0508 07/09/18 0524 07/10/18 0403  WBC 9.9 12.6* 9.7 8.8 8.6  NEUTROABS 7.0  --   --   --   --   HGB 11.3* 10.8* 10.6* 10.4* 10.3*  HCT 34.8* 34.4* 32.8* 33.6* 33.6*  MCV 94.6 97.5 96.2 97.7 97.7  PLT 297 284 275 320 377   No results for input(s): CKTOTAL, CKMB, CKMBINDEX, TROPONINI in the last 168 hours. No results for input(s): LABPROT, INR in the last 72 hours. No results for input(s): COLORURINE, LABSPEC, [place], GLUCOSEU, HGBUR, BILIRUBINUR, KETONESUR, PROTEINUR, UROBILINOGEN, NITRITE, LEUKOCYTESUR in the last 72 hours.  Invalid input(s): APPERANCEUR     Component Value Date/Time   CHOL 146 07/06/2018 0704   CHOL 189 05/12/2018 1212   TRIG 124 07/06/2018 0704   HDL 49  07/06/2018 0704   HDL 63 05/12/2018 1212   CHOLHDL 3.0 07/06/2018 0704   VLDL 25 07/06/2018 0704   LDLCALC 72 07/06/2018 0704   LDLCALC 103 (H) 05/12/2018 1212   Lab Results  Component Value Date   HGBA1C 5.6 07/06/2018   No results found for: LABOPIA, COCAINSCRNUR, LABBENZ, AMPHETMU, THCU, LABBARB  No results for input(s): ETH in the last 168 hours.  I have personally reviewed the radiological images below and agree with the radiology interpretations.  [name] NF Contrast  Result Date: 07/06/2018 CLINICAL DATA:  Follow-up examination for acute stroke. 77 year old female who presented with aphasia and right-sided weakness, status post tPA and catheter directed thrombectomy. EXAM: MRI HEAD WITHOUT CONTRAST MRA HEAD WITHOUT CONTRAST TECHNIQUE: Multiplanar, multiecho pulse sequences of the brain and surrounding structures were obtained without intravenous contrast. Angiographic images of the head were obtained using MRA technique without contrast. COMPARISON:  Prior CT and arteriogram from 07/05/2018. FINDINGS: MRI HEAD FINDINGS Brain: Generalized age-related cerebral atrophy. Patchy and confluent T2/FLAIR hyperintensity within the periventricular and deep white matter both cerebral hemispheres, most consistent with chronic small vessel ischemic disease, moderate in nature. Patchy small moderate volume restricted diffusion seen involving the left insula and overlying left  frontal operculum, extending to involve the left periventricular white matter, consistent with acute left MCA territory infarct (series 3, image 26). Associated mild localized gyral swelling and edema without significant mass effect. Patchy susceptibility artifact within the area of infarction consistent with associated petechial hemorrhage (series 6, image 54). No frank hemorrhagic transformation by MRI. No other evidence for acute or subacute ischemia. Gray-white matter differentiation otherwise maintained. Few small chronic  micro hemorrhages noted involving the temporal lobes bilaterally. Probable 12 mm densely calcified meningioma overlies the anterior left frontal convexity. No associated edema or mass effect. No other mass lesion. No midline shift or mass effect. No hydrocephalus. Pituitary gland within normal limits. Vascular: Major intracranial vascular flow voids maintained. Skull and upper cervical spine: Craniocervical junction within normal limits. Bone marrow signal intensity normal. Mild soft tissue swelling noted at the left frontoparietal scalp near the vertex (series 11, image 12). Sinuses/Orbits: Globes and orbital soft tissues within normal limits. Paranasal sinuses are clear. Small left mastoid effusion, of doubtful significance. Inner ear structures grossly normal. Other: None. MRA HEAD FINDINGS ANTERIOR CIRCULATION: Distal cervical segments of the internal carotid arteries are widely patent with antegrade flow. Petrous, cavernous, and supraclinoid segments widely patent without stenosis. Origin of the ophthalmic arteries patent bilaterally. ICA termini well perfused and symmetric. A1 segments patent bilaterally. Normal anterior communicating artery. Anterior cerebral arteries widely patent to their distal aspects without stenosis. Right M1 widely patent without stenosis. Normal right MCA bifurcation. No proximal right M2 occlusion. Distal right MCA branches well perfused. Left M1 duplicated and/or bifurcates early at the origin. No M1 stenosis or occlusion. No proximal left M2 occlusion. Distal left MCA branches well perfused. POSTERIOR CIRCULATION: Vertebral arteries patent to the vertebrobasilar junction without stenosis. Right vertebral artery dominant. Posterior inferior cerebral arteries not visualized. Basilar widely patent to its distal aspect without stenosis. Superior cerebral arteries patent bilaterally. Posterior cerebral arteries well perfused to their distal aspects without stenosis. Prominent left  posterior communicating artery noted. IMPRESSION: MRI HEAD IMPRESSION: 1. Small to moderate evolving acute left MCA territory infarct as above. Associated petechial hemorrhage without frank hemorrhagic transformation or significant mass effect. 2. Underlying moderate chronic microvascular ischemic disease. MRA HEAD IMPRESSION: 1. Negative intracranial MRA. No evidence for large vessel occlusion status post catheter directed thrombectomy. No hemodynamically significant or correctable stenosis identified. 2. Duplicated left M1 segment. Electronically Signed   By: Jeannine Boga M.D.   On: 07/06/2018 19:43   Mr Brain Wo Contrast  Result Date: 07/06/2018 CLINICAL DATA:  Follow-up examination for acute stroke. 77 year old female who presented with aphasia and right-sided weakness, status post tPA and catheter directed thrombectomy. EXAM: MRI HEAD WITHOUT CONTRAST MRA HEAD WITHOUT CONTRAST TECHNIQUE: Multiplanar, multiecho pulse sequences of the brain and surrounding structures were obtained without intravenous contrast. Angiographic images of the head were obtained using MRA technique without contrast. COMPARISON:  Prior CT and arteriogram from 07/05/2018. FINDINGS: MRI HEAD FINDINGS Brain: Generalized age-related cerebral atrophy. Patchy and confluent T2/FLAIR hyperintensity within the periventricular and deep white matter both cerebral hemispheres, most consistent with chronic small vessel ischemic disease, moderate in nature. Patchy small moderate volume restricted diffusion seen involving the left insula and overlying left frontal operculum, extending to involve the left periventricular white matter, consistent with acute left MCA territory infarct (series 3, image 26). Associated mild localized gyral swelling and edema without significant mass effect. Patchy susceptibility artifact within the area of infarction consistent with associated petechial hemorrhage (series 6, image 54). No frank hemorrhagic  transformation  by MRI. No other evidence for acute or subacute ischemia. Gray-white matter differentiation otherwise maintained. Few small chronic micro hemorrhages noted involving the temporal lobes bilaterally. Probable 12 mm densely calcified meningioma overlies the anterior left frontal convexity. No associated edema or mass effect. No other mass lesion. No midline shift or mass effect. No hydrocephalus. Pituitary gland within normal limits. Vascular: Major intracranial vascular flow voids maintained. Skull and upper cervical spine: Craniocervical junction within normal limits. Bone marrow signal intensity normal. Mild soft tissue swelling noted at the left frontoparietal scalp near the vertex (series 11, image 12). Sinuses/Orbits: Globes and orbital soft tissues within normal limits. Paranasal sinuses are clear. Small left mastoid effusion, of doubtful significance. Inner ear structures grossly normal. Other: None. MRA HEAD FINDINGS ANTERIOR CIRCULATION: Distal cervical segments of the internal carotid arteries are widely patent with antegrade flow. Petrous, cavernous, and supraclinoid segments widely patent without stenosis. Origin of the ophthalmic arteries patent bilaterally. ICA termini well perfused and symmetric. A1 segments patent bilaterally. Normal anterior communicating artery. Anterior cerebral arteries widely patent to their distal aspects without stenosis. Right M1 widely patent without stenosis. Normal right MCA bifurcation. No proximal right M2 occlusion. Distal right MCA branches well perfused. Left M1 duplicated and/or bifurcates early at the origin. No M1 stenosis or occlusion. No proximal left M2 occlusion. Distal left MCA branches well perfused. POSTERIOR CIRCULATION: Vertebral arteries patent to the vertebrobasilar junction without stenosis. Right vertebral artery dominant. Posterior inferior cerebral arteries not visualized. Basilar widely patent to its distal aspect without stenosis.  Superior cerebral arteries patent bilaterally. Posterior cerebral arteries well perfused to their distal aspects without stenosis. Prominent left posterior communicating artery noted. IMPRESSION: MRI HEAD IMPRESSION: 1. Small to moderate evolving acute left MCA territory infarct as above. Associated petechial hemorrhage without frank hemorrhagic transformation or significant mass effect. 2. Underlying moderate chronic microvascular ischemic disease. MRA HEAD IMPRESSION: 1. Negative intracranial MRA. No evidence for large vessel occlusion status post catheter directed thrombectomy. No hemodynamically significant or correctable stenosis identified. 2. Duplicated left M1 segment. Electronically Signed   By: Jeannine Boga M.D.   On: 07/06/2018 19:43   [place]  Result Date: 07/07/2018 INDICATION: Global aphasia. Right-sided hemiplegia. Occluded inferior division of the left middle cerebral artery M2 M3 region EXAM: 1. EMERGENT LARGE VESSEL OCCLUSION THROMBOLYSIS (POSTERIOR CIRCULATION) MEDICATIONS: No antibiotic was administered within 1 hour of the procedure. ANESTHESIA/SEDATION: General anesthesia CONTRAST:  Isovue 300 approximately 110 mL. FLUOROSCOPY TIME:  Fluoroscopy Time: 73 minutes 24 seconds (3818 mGy). COMPLICATIONS: None immediate. TECHNIQUE: Following a full explanation of the procedure along with the potential associated complications, an informed witnessed consent was obtained from the patient's husband. The risks of intracranial hemorrhage of 10%, worsening neurological deficit, ventilator dependency, death and inability to revascularize were all reviewed in detail with the patient's husband. The patient was then put under general anesthesia by the Department of Anesthesiology at [facility]. The right groin was prepped and draped in the usual sterile fashion. Thereafter using modified Seldinger technique, transfemoral access into the right common femoral artery was obtained  without difficulty. Over a 0.035 inch guidewire a 5 French Pinnacle sheath was inserted. Through this, and also over a 0.035 inch guidewire a 5 [place] JB 1 catheter was advanced to the aortic arch region and selectively positioned in the left common carotid artery. FINDINGS: The left common carotid arteriogram demonstrates the left external carotid artery and its major branches to be widely patent. The left internal carotid artery  at the bulb demonstrates approximately 60% stenosis by the NASCET criteria secondary to a smooth posterior wall atherosclerotic plaque. Distal to this the left internal carotid artery is seen to opacify to the cranial skull base. The petrous, cavernous and supraclinoid segments are widely patent. A left posterior communicating artery is seen opacifying the left posterior cerebral artery distribution. 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 The inferior division from the superior duplicated branch demonstrates complete angiographic occlusion. The left anterior cerebral artery opacifies into the capillary and venous phases. The lateral projection of the whole head run demonstrates a large area of hypoperfusion involving the left parietal and posterior frontal regions. PROCEDURE: ENDOVASCULAR REVASCULARIZATION OF OCCLUDED INFERIOR DIVISION OF THE UPPER BRANCH OF THE LEFT MIDDLE CEREBRAL ARTERY, WITH 2 PASSES WITH THE TREVO PROVUE 3 MM X 20 MM RETRIEVAL DEVICE, AND 1 PASS WITH THE 5 MM X 33 MM EMBOTRAP RETRIEVAL DEVICE ACHIEVING A TICI 3 REVASCULARIZATION. The diagnostic JB 1 catheter in the left common carotid artery was exchanged over a 0.035 inch 300 cm Rosen exchange guidewire for an 8 French 55 cm Brite tip neurovascular sheath using biplane roadmap technique and constant fluoroscopic guidance. Good aspiration obtained from the hub of the neurovascular sheath. This was then connected to continuous heparinized saline infusion. Over the [employer] guidewire, an 8 [place] 85 cm FlowGate balloon guide catheter which had been prepped with 50% contrast and 50% heparinized saline infusion was positioned just proximal to the left common carotid bifurcation. The guidewire was removed. Good aspiration obtained from the hub of the [facility] guide catheter. Gentle contrast injection demonstrated no evidence of spasms, dissections or of intraluminal filling defects. A combination of a Catalyst 5 French 132 cm guide catheter inside of which was an 021 Trevo ProVue microcatheter was advanced over a 0.014 inch Softip Synchro micro guidewire to the distal end of the [facility] guide catheter. With the micro guidewire leading with a J-tip configuration to avoid dissections or inducing spasm, the combination was navigated without difficulty to the supraclinoid left ICA. The superior division was then selected with the micro guidewire followed by the microcatheter. Access through the occluded inferior division was then obtained with the micro guidewire to the M2 M3 regions followed by the microcatheter. The guidewire was removed. Good aspiration obtained from the hub of the microcatheter. A gentle contrast injection demonstrated slow distal antegrade flow. The microcatheter was then connected to continuous heparinized saline infusion. A 3 mm x 20 mm Trevo ProVue retrieval device was advanced to the distal end of microcatheter. The proximal and the distal landing zones were then defined. The O ring on the delivery microcatheter was loosened. With slight forward gentle traction with the right hand on the delivery micro guidewire, with the left hand the delivery microcatheter was retrieved unsheathing the retrieval device. A control arteriogram performed through the Catalyst guide catheter in the supraclinoid left ICA demonstrated partial revascularization of the inferior division of the left middle cerebral artery. With proximal flow arrest in the left common carotid artery  at the site of bifurcation, the combination of the retrieval device, the microcatheter, and the Catalyst guide catheter was retrieved as constant aspiration was applied with a 60 mL syringe at the hub of the [facility] guide catheter, and a Penumbra vacuum suction device at the hub of the Catalyst guide catheter. The combinations were retrieved and removed. No evidence of clot was seen in the retrieval device, or in the aspirate. Aspiration was  continued as the proximal flow arrest was reversed. A control arteriogram performed through the [facility] guide catheter in the left common carotid artery demonstrated no change in the occluded inferior division of the superior branch of the duplicated left MCA. A second attempt was then made again using the above combination. Again access into the distal M2 M3 region of the occluded vessel was obtained with the micro guidewire followed by the microcatheter. After having verified safe position of tip of the microcatheter, the 3 mm x 20 mm Trevo ProVue device was again deployed as above. With proximal flow arrest in the left common carotid artery by inflating the balloon of the [facility] guide catheter, the combination of the retrieval device, microcatheter and the Catalyst guide catheter were retrieved and removed. Again aspiration was applied with a 60 mL syringe at the hub of the [facility] guide catheter, and the Penumbra suction device at the hub of the Catalyst guide catheter. Aspiration was continued as flow arrest was reversed. A control arteriogram performed again through the [facility] guide catheter in the left common carotid artery continued to demonstrate no real opacification of the inferior division of the superior branch of the duplicated left middle cerebral artery. A controlled arteriogram performed was then made using the combination of the Trevo ProVue microcatheter inside of a 5 French 132 cm Catalyst guide catheter which was advanced to the left middle cerebral  artery over a 0.014 inch Softip Synchro micro guidewire. Again access into the occluded inferior division was obtained with the micro guidewire followed by the microcatheter which was positioned in the M3 region of the left middle cerebral artery. The guidewire was removed. Again after having verified safe position of the tip of the microcatheter, a 5 mm x 33 mm Embotrap retrieval device was advanced to the distal end of the microcatheter. The proximal and the distal landing zones were defined. With proximal flow arrest in the left internal carotid artery, the combination of the retrieval device, the microcatheter, and the 5 French 132 cm Catalyst guide catheter were retrieved and removed as constant aspiration was applied with a 60 mL syringe at the hub of the [facility] guide catheter, and the Penumbra suction device at the hub of the Catalyst guide catheter. The combination was retrieved and removed. A clot was noted entangled and the cells of the retrieval device. The aspirate was continued as flow arrest was reversed by deflating the balloon in the left common carotid artery. An arteriogram performed with free back bleed of blood at the hub of the [facility] guide catheter demonstrated now complete revascularization of the previously occluded inferior division of the superior branch of the left middle cerebral artery. A control arteriogram performed centered over the whole head now demonstrated reperfusion of the previously noted hypoperfused parietal and the posterior frontal regions. A TICI 3 revascularization had been achieved of the left middle cerebral distribution. A control arteriogram performed through the [facility] guide catheter in the left common carotid artery centered over the carotid bifurcation continued to demonstrate 60% stenosis of the proximal left internal carotid artery at the bulb. No evidence of dissections or of intraluminal filling defects were noted extra cranially. The 8 [place] FlowGate  guide catheter, and the 8 [place] neurovascular sheath were then removed over a J-tip guidewire for an 8 [place] Pinnacle sheath. This was then removed with the successful application of a 7 [place] Exo-Seal closure device achieving hemostasis. Distal pulses in both feet were Dopplerable in the dorsalis pedis, and the posterior  tibial regions bilaterally unchanged from prior to the beginning of the procedure. The right groin appeared soft without evidence of hematoma or bleeding. A Dyna CT of the brain performed demonstrated a focal area of hyperdensity in the subcortical insular area lateral to the putamen and a small one just cranial and lateral to this. No known gross hemorrhages, or mass effect or midline shift was noted. The patient was left intubated on account of her difficulty with comprehension prior to the intubation. The patient was then transferred to the neuro ICU to continue with post thrombectomy management. IMPRESSION: Status post endovascular complete revascularization of occluded inferior division of the superior duplicated left middle cerebral artery with 2 passes with the 3 mm x 20 mm Trevo ProVue retrieval device, and 1 pass with the Embotrap 5 mm x 33 mm retrieval device achieving a revascularization. PLAN: Follow-up in the clinic in 4 to 6 weeks post discharge. Electronically Signed   By: Luanne Bras M.D.   On: 07/06/2018 14:56   Dg Chest Port 1 View  Result Date: 07/07/2018 CLINICAL DATA:  CHF. Hx of afib, chronic systolic heart failure, COPD, coronary artery disease, hypertension, pre-diabetes, left heart cath and coronary angiography(04/29/17). Former [phone]). EXAM: PORTABLE CHEST 1 VIEW COMPARISON:  07/06/2018 FINDINGS: Moderate enlargement of the cardiopericardial silhouette no overt edema. Atherosclerotic calcification of the aortic arch. The lungs appear otherwise clear. Degenerative glenohumeral spurring. IMPRESSION: 1. Moderate enlargement of the cardiopericardial  silhouette, without edema. 2. Degenerative glenohumeral arthropathy bilaterally. Electronically Signed   By: Van Clines M.D.   On: 07/07/2018 11:48   Portable Chest X-ray  Result Date: 07/06/2018 CLINICAL DATA:  Endotracheal intubation EXAM: PORTABLE CHEST 1 VIEW COMPARISON:  None. FINDINGS: Endotracheal tube tip is at the level of the clavicular heads. There is moderate cardiomegaly without pulmonary edema. No pneumothorax. No focal airspace consolidation. IMPRESSION: 1. Endotracheal tube tip at the level of the clavicular heads. 2. Cardiomegaly. Electronically Signed   By: Ulyses Jarred M.D.   On: 07/06/2018 02:24   Dg Abd Portable 1v  Result Date: 07/06/2018 CLINICAL DATA:  Orogastric tube placement EXAM: PORTABLE ABDOMEN - 1 VIEW COMPARISON:  None. FINDINGS: OG tube tip and side port project over the midline upper abdomen. The tip is in the expected location of the gastroduodenal junction. IMPRESSION: OG tube tip in the expected location of the gastroduodenal junction. Electronically Signed   By: Ulyses Jarred M.D.   On: 07/06/2018 02:23   Ir Percutaneous Art Thrombectomy/infusion Intracranial Inc Diag Angio  Result Date: 07/07/2018 INDICATION: Global aphasia. Right-sided hemiplegia. Occluded inferior division of the left middle cerebral artery M2 M3 region EXAM: 1. EMERGENT LARGE VESSEL OCCLUSION THROMBOLYSIS (POSTERIOR CIRCULATION) MEDICATIONS: No antibiotic was administered within 1 hour of the procedure. ANESTHESIA/SEDATION: General anesthesia CONTRAST:  Isovue 300 approximately 110 mL. FLUOROSCOPY TIME:  Fluoroscopy Time: 73 minutes 24 seconds (3818 mGy). COMPLICATIONS: None immediate. TECHNIQUE: Following a full explanation of the procedure along with the potential associated complications, an informed witnessed consent was obtained from the patient's husband. The risks of intracranial hemorrhage of 10%, worsening neurological deficit, ventilator dependency, death and inability to  revascularize were all reviewed in detail with the patient's husband. The patient was then put under general anesthesia by the Department of Anesthesiology at [facility]. The right groin was prepped and draped in the usual sterile fashion. Thereafter using modified Seldinger technique, transfemoral access into the right common femoral artery was obtained without difficulty. Over a 0.035 inch guidewire a 5 [place]  Pinnacle sheath was inserted. Through this, and also over a 0.035 inch guidewire a 5 [place] JB 1 catheter was advanced to the aortic arch region and selectively positioned in the left common carotid artery. FINDINGS: The left common carotid arteriogram demonstrates the left external carotid artery and its major branches to be widely patent. The left internal carotid artery at the bulb demonstrates approximately 60% stenosis by the NASCET criteria secondary to a smooth posterior wall atherosclerotic plaque. Distal to this the left internal carotid artery is seen to opacify to the cranial skull base. The petrous, cavernous and supraclinoid segments are widely patent. A left posterior communicating artery is seen opacifying the left posterior cerebral artery distribution. 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 The inferior division from the superior duplicated branch demonstrates complete angiographic occlusion. The left anterior cerebral artery opacifies into the capillary and venous phases. The lateral projection of the whole head run demonstrates a large area of hypoperfusion involving the left parietal and posterior frontal regions. PROCEDURE: ENDOVASCULAR REVASCULARIZATION OF OCCLUDED INFERIOR DIVISION OF THE UPPER BRANCH OF THE LEFT MIDDLE CEREBRAL ARTERY, WITH 2 PASSES WITH THE TREVO PROVUE 3 MM X 20 MM RETRIEVAL DEVICE, AND 1 PASS WITH THE 5 MM X 33 MM EMBOTRAP RETRIEVAL DEVICE ACHIEVING A TICI 3 REVASCULARIZATION. The diagnostic JB 1 catheter  in the left common carotid artery was exchanged over a 0.035 inch 300 cm Rosen exchange guidewire for an 8 French 55 cm Brite tip neurovascular sheath using biplane roadmap technique and constant fluoroscopic guidance. Good aspiration obtained from the hub of the neurovascular sheath. This was then connected to continuous heparinized saline infusion. Over the [employer] guidewire, an 8 [place] 85 cm FlowGate balloon guide catheter which had been prepped with 50% contrast and 50% heparinized saline infusion was positioned just proximal to the left common carotid bifurcation. The guidewire was removed. Good aspiration obtained from the hub of the [facility] guide catheter. Gentle contrast injection demonstrated no evidence of spasms, dissections or of intraluminal filling defects. A combination of a Catalyst 5 French 132 cm guide catheter inside of which was an 021 Trevo ProVue microcatheter was advanced over a 0.014 inch Softip Synchro micro guidewire to the distal end of the [facility] guide catheter. With the micro guidewire leading with a J-tip configuration to avoid dissections or inducing spasm, the combination was navigated without difficulty to the supraclinoid left ICA. The superior division was then selected with the micro guidewire followed by the microcatheter. Access through the occluded inferior division was then obtained with the micro guidewire to the M2 M3 regions followed by the microcatheter. The guidewire was removed. Good aspiration obtained from the hub of the microcatheter. A gentle contrast injection demonstrated slow distal antegrade flow. The microcatheter was then connected to continuous heparinized saline infusion. A 3 mm x 20 mm Trevo ProVue retrieval device was advanced to the distal end of microcatheter. The proximal and the distal landing zones were then defined. The O ring on the delivery microcatheter was loosened. With slight forward gentle traction with the right hand on the delivery  micro guidewire, with the left hand the delivery microcatheter was retrieved unsheathing the retrieval device. A control arteriogram performed through the Catalyst guide catheter in the supraclinoid left ICA demonstrated partial revascularization of the inferior division of the left middle cerebral artery. With proximal flow arrest in the left common carotid artery at the site of bifurcation, the combination of the retrieval device, the microcatheter, and  the Catalyst guide catheter was retrieved as constant aspiration was applied with a 60 mL syringe at the hub of the [facility] guide catheter, and a Penumbra vacuum suction device at the hub of the Catalyst guide catheter. The combinations were retrieved and removed. No evidence of clot was seen in the retrieval device, or in the aspirate. Aspiration was continued as the proximal flow arrest was reversed. A control arteriogram performed through the [facility] guide catheter in the left common carotid artery demonstrated no change in the occluded inferior division of the superior branch of the duplicated left MCA. A second attempt was then made again using the above combination. Again access into the distal M2 M3 region of the occluded vessel was obtained with the micro guidewire followed by the microcatheter. After having verified safe position of tip of the microcatheter, the 3 mm x 20 mm Trevo ProVue device was again deployed as above. With proximal flow arrest in the left common carotid artery by inflating the balloon of the [facility] guide catheter, the combination of the retrieval device, microcatheter and the Catalyst guide catheter were retrieved and removed. Again aspiration was applied with a 60 mL syringe at the hub of the [facility] guide catheter, and the Penumbra suction device at the hub of the Catalyst guide catheter. Aspiration was continued as flow arrest was reversed. A control arteriogram performed again through the [facility] guide catheter in the left  common carotid artery continued to demonstrate no real opacification of the inferior division of the superior branch of the duplicated left middle cerebral artery. A controlled arteriogram performed was then made using the combination of the Trevo ProVue microcatheter inside of a 5 French 132 cm Catalyst guide catheter which was advanced to the left middle cerebral artery over a 0.014 inch Softip Synchro micro guidewire. Again access into the occluded inferior division was obtained with the micro guidewire followed by the microcatheter which was positioned in the M3 region of the left middle cerebral artery. The guidewire was removed. Again after having verified safe position of the tip of the microcatheter, a 5 mm x 33 mm Embotrap retrieval device was advanced to the distal end of the microcatheter. The proximal and the distal landing zones were defined. With proximal flow arrest in the left internal carotid artery, the combination of the retrieval device, the microcatheter, and the 5 French 132 cm Catalyst guide catheter were retrieved and removed as constant aspiration was applied with a 60 mL syringe at the hub of the [facility] guide catheter, and the Penumbra suction device at the hub of the Catalyst guide catheter. The combination was retrieved and removed. A clot was noted entangled and the cells of the retrieval device. The aspirate was continued as flow arrest was reversed by deflating the balloon in the left common carotid artery. An arteriogram performed with free back bleed of blood at the hub of the [facility] guide catheter demonstrated now complete revascularization of the previously occluded inferior division of the superior branch of the left middle cerebral artery. A control arteriogram performed centered over the whole head now demonstrated reperfusion of the previously noted hypoperfused parietal and the posterior frontal regions. A TICI 3 revascularization had been achieved of the left middle  cerebral distribution. A control arteriogram performed through the [facility] guide catheter in the left common carotid artery centered over the carotid bifurcation continued to demonstrate 60% stenosis of the proximal left internal carotid artery at the bulb. No evidence of dissections or of intraluminal filling defects  were noted extra cranially. The 8 [place] FlowGate guide catheter, and the 8 [place] neurovascular sheath were then removed over a J-tip guidewire for an 8 [place] Pinnacle sheath. This was then removed with the successful application of a 7 [place] Exo-Seal closure device achieving hemostasis. Distal pulses in both feet were Dopplerable in the dorsalis pedis, and the posterior tibial regions bilaterally unchanged from prior to the beginning of the procedure. The right groin appeared soft without evidence of hematoma or bleeding. A Dyna CT of the brain performed demonstrated a focal area of hyperdensity in the subcortical insular area lateral to the putamen and a small one just cranial and lateral to this. No known gross hemorrhages, or mass effect or midline shift was noted. The patient was left intubated on account of her difficulty with comprehension prior to the intubation. The patient was then transferred to the neuro ICU to continue with post thrombectomy management. IMPRESSION: Status post endovascular complete revascularization of occluded inferior division of the superior duplicated left middle cerebral artery with 2 passes with the 3 mm x 20 mm Trevo ProVue retrieval device, and 1 pass with the Embotrap 5 mm x 33 mm retrieval device achieving a revascularization. PLAN: Follow-up in the clinic in 4 to 6 weeks post discharge. Electronically Signed   By: Luanne Bras M.D.   On: 07/06/2018 14:56   Carotid (at [place] Only)  Result Date: 07/07/2018 Carotid Arterial Duplex Study Indications: CVA. Performing Technologist: Oliver Hum RVT  Examination Guidelines: A complete evaluation  includes B-mode imaging, spectral Doppler, color Doppler, and power Doppler as needed of all accessible portions of each vessel. Bilateral testing is considered an integral part of a complete examination. Limited examinations for reoccurring indications may be performed as noted.  Right Carotid Findings: +----------+--------+-------+--------+--------------------------------+--------+           PSV cm/sEDV    StenosisDescribe                        Comments                   cm/s                                                    +----------+--------+-------+--------+--------------------------------+--------+ CCA Prox  65      17             smooth and heterogenous                  +----------+--------+-------+--------+--------------------------------+--------+ CCA Distal53      13             smooth, heterogenous and                                                  calcific                                 +----------+--------+-------+--------+--------------------------------+--------+ ICA Prox  56      16             smooth, heterogenous and  calcific                                 +----------+--------+-------+--------+--------------------------------+--------+ ICA Distal73      22                                             tortuous +----------+--------+-------+--------+--------------------------------+--------+ ECA       331     37             calcific                                 +----------+--------+-------+--------+--------------------------------+--------+ +----------+--------+-------+--------+-------------------+           PSV cm/sEDV cmsDescribeArm Pressure (mmHG) +----------+--------+-------+--------+-------------------+ [id number]                                        +----------+--------+-------+--------+-------------------+ +---------+--------+--+--------+--+---------+  VertebralPSV cm/s60EDV cm/s20Antegrade +---------+--------+--+--------+--+---------+  Left Carotid Findings: +----------+--------+-------+--------+--------------------------------+--------+           PSV cm/sEDV    StenosisDescribe                        Comments                   cm/s                                                    +----------+--------+-------+--------+--------------------------------+--------+ CCA Prox  70      20             smooth and heterogenous                  +----------+--------+-------+--------+--------------------------------+--------+ CCA Distal68      14             smooth, heterogenous and                                                  calcific                                 +----------+--------+-------+--------+--------------------------------+--------+ ICA Prox  166     44             smooth, heterogenous and                                                  calcific                                 +----------+--------+-------+--------+--------------------------------+--------+ ICA Mid   102     23  tortuous +----------+--------+-------+--------+--------------------------------+--------+ ICA Distal56      18                                             tortuous +----------+--------+-------+--------+--------------------------------+--------+ ECA       57      11             calcific                                 +----------+--------+-------+--------+--------------------------------+--------+ +----------+--------+--------+--------+-------------------+ SubclavianPSV cm/sEDV cm/sDescribeArm Pressure (mmHG) +----------+--------+--------+--------+-------------------+           102                                         +----------+--------+--------+--------+-------------------+ +---------+--------+--+--------+--+---------+ VertebralPSV cm/s54EDV  cm/s15Antegrade +---------+--------+--+--------+--+---------+  Summary: Right Carotid: Velocities in the right ICA are consistent with a 1-39% stenosis. Left Carotid: Velocities in the left ICA are consistent with a 40-59% stenosis. Vertebrals: Bilateral vertebral arteries demonstrate antegrade flow. *See table(s) above for measurements and observations.  Electronically signed by Antony Contras MD on 07/07/2018 at 8:02:21 AM.    Final    CXR 07/08/18 IMPRESSION: Stable cardiomegaly.  No acute abnormality seen.   PHYSICAL EXAM  Temp:  [98.1 F (36.7 C)-98.4 F (36.9 C)] 98.4 F (36.9 C) (11/22 0400) Pulse Rate:  [53-68] 58 (11/22 0400) Resp:  [13-25] 17 (11/22 0400) BP: (137-158)/(52-129) 158/82 (11/22 0400) SpO2:  [97 %-99 %] 97 % (11/22 0400)  General - Well nourished, well developed, not in acute distress  Ophthalmologic - fundi not visualized due to noncooperation.  Cardiovascular - irregularly irregular heart rate and rhythm.  Neuro - awake alert, sitting in chair, eyes open and smiling to provider, and following simple commands on both hands. Still has mild expressive aphasia but much improved from yesterday, able to name 2 out of 3 and repeat simple sentences, able to answer questions with short answers, orientated to self and age. Eyes move in both directions, no forced gaze. Right facial droop, tongue midline, blinking to visual threat bilaterally. LUE and LLE 4+/5, RUE 4/5 proximal but 0/5 distal finger movement. RLE 3+/5 proximal and 4/5 distally. DTR 1+ and positive babinski on the right. Sensation and coordination not cooperative. Gait not tested.    ASSESSMENT/PLAN [name] is a 77 y.o. female with history of afib not on AC, CAD, HTN, HLD, DM and obsity admitted for aphasia and right sided weakness. TPA given and s/p thrombectomy.    Stroke:  left MCA infarct with left M2 occlusion s/p tPA and IR with TICI3 reperfusion, embolic secondary to Afib not on  [facility]  Resultant right hemiparesis, expressive aphasia  CTA head and neck - duplicated left MCAs with left M2/M3 occlusion  DSA left M2/M3 occlusion s/p TICI3 reperfusion  MRI left MCA small to moderate infarcts   MRA unremarkable, duplicated MCA  CUS left ICA 40-59% stenosis  2D Echo EF 25-30% down from 04/2017 with 35-40%  LDL 72  HgbA1c 5.6  SCDs for VTE prophylaxis  aspirin 325 mg daily prior to admission, now on eliquis for stroke prevention  Ongoing aggressive stroke risk factor management  Therapy recommendations:  CIR  Disposition:  Pending  CHF  EF 25-30% down from 35-40% one year ago  CCM on board  received IV lasix 40 07/07/18  Gentle hydration with NS @ 30 cc  Continue digoxin and metoprolol  CXR cardiomegaly but no CHF  Continue tele monitoring  Chronic afib on ASA   Followed with Dr. Geraldo Pitter  Not on [facility] due to back surgery with unsteady gait  On ASA 325 PTA  Rate controlled  On digoxin and metoprolol PTA  Now on eliquis for stroke prevention   Spiking fever, resolved  Tmax 101.2->afebrile-> afebrile  CXR 07/07/18 - Moderate enlargement of the cardiopericardial silhouette, without edema.  CXR stable cardiomegaly, no acute abnormality  Diabetes  HgbA1c 5.6 goal < 7.0  Controlled  CBG monitoring  SSI  Hypertension . Stable . BP goal < 160  Long term BP goal normotensive  Hyperlipidemia  Home meds:  none   LDL 72, goal < 70  On pravastatin low dose  Continue statin on discharge  Dysphagia   On pudding thick liquid  Speech on board  Gentle hydration due to decreased fluid intake  Other Stroke Risk Factors  Advanced age  Obesity, Body mass index is 36.58 kg/m.   Coronary artery disease  Other Active Problems  Depression on zoloft  Hospital day # 5  Rosalin Hawking, MD PhD Stroke Neurology 07/10/2018 11:26 AM     To contact Stroke Continuity provider, please refer to [email or web address] After hours,  contact General Neurology

## 2018-07-11 ENCOUNTER — Inpatient Hospital Stay (HOSPITAL_COMMUNITY): Payer: PPO | Admitting: Occupational Therapy

## 2018-07-11 ENCOUNTER — Inpatient Hospital Stay (HOSPITAL_COMMUNITY): Payer: PPO | Admitting: Physical Therapy

## 2018-07-11 ENCOUNTER — Inpatient Hospital Stay (HOSPITAL_COMMUNITY): Payer: PPO | Admitting: Speech Pathology

## 2018-07-11 DIAGNOSIS — I63512 Cerebral infarction due to unspecified occlusion or stenosis of left middle cerebral artery: Secondary | ICD-10-CM

## 2018-07-11 DIAGNOSIS — I480 Paroxysmal atrial fibrillation: Secondary | ICD-10-CM

## 2018-07-11 DIAGNOSIS — I69320 Aphasia following cerebral infarction: Secondary | ICD-10-CM

## 2018-07-11 DIAGNOSIS — R7303 Prediabetes: Secondary | ICD-10-CM

## 2018-07-11 LAB — GLUCOSE, CAPILLARY
GLUCOSE-CAPILLARY: 92 mg/dL (ref 70–99)
GLUCOSE-CAPILLARY: 96 mg/dL (ref 70–99)
Glucose-Capillary: 105 mg/dL — ABNORMAL HIGH (ref 70–99)
Glucose-Capillary: 98 mg/dL (ref 70–99)
Glucose-Capillary: 123 mg/dL — ABNORMAL HIGH (ref 70–99)

## 2018-07-11 MED ORDER — METOPROLOL TARTRATE 25 MG PO TABS
25.0000 mg | ORAL_TABLET | Freq: Two times a day (BID) | ORAL | Status: DC
  Administered 2018-07-11 – 2018-07-14 (×8): 25 mg via ORAL
  Filled 2018-07-11 (×9): qty 1

## 2018-07-11 NOTE — Progress Notes (Signed)
Kaitlyn Underwood is a 77 y.o. female 27-Jul-1941 [id number]  Subjective: Completing nebulizer treatment at time of my visit.  Reports no problems overnight.  No concerns or questions today  Objective: Vital signs in last 24 hours: Temp:  [98 F (36.7 C)-98.9 F (37.2 C)] 98.9 F (37.2 C) (11/23 0358) Pulse Rate:  [51-83] 60 (11/23 1037) Resp:  [18] 18 (11/23 0911) BP: (140-172)/(46-90) 172/89 (11/23 1037) SpO2:  [96 %-98 %] 96 % (11/23 0911) Weight:  [90.4 kg] 90.4 kg (11/22 1700) Weight change:  Last BM Date: 07/10/18  Intake/Output from previous day: 11/22 0701 - 11/23 0700 In: 243.1 [P.O.:240; I.V.:3.1] Out: -   Physical Exam General: No apparent distress   sitting in bedside wheelchair Lungs: Normal effort. Lungs clear to auscultation with moderate air movement bilaterally, no crackles or wheezes. Cardiovascular: Regular rate and rhythm, no edema Neurological: No new neurological deficits   Lab Results: BMET    Component Value Date/Time   NA 143 07/10/2018 0403   NA 141 05/12/2018 1212   K 4.0 07/10/2018 0403   CL 113 (H) 07/10/2018 0403   CO2 23 07/10/2018 0403   GLUCOSE 88 07/10/2018 0403   BUN 15 07/10/2018 0403   BUN 25 05/12/2018 1212   CREATININE 0.88 07/10/2018 0403   CALCIUM 9.6 07/10/2018 0403   GFRNONAA >60 07/10/2018 0403   GFRAA >60 07/10/2018 0403   CBC    Component Value Date/Time   WBC 8.6 07/10/2018 0403   RBC 3.44 (L) 07/10/2018 0403   HGB 10.3 (L) 07/10/2018 0403   HGB 13.7 05/12/2018 1212   HCT 33.6 (L) 07/10/2018 0403   HCT 40.8 05/12/2018 1212   PLT 377 07/10/2018 0403   PLT 414 05/12/2018 1212   MCV 97.7 07/10/2018 0403   MCV 94 05/12/2018 1212   MCH 29.9 07/10/2018 0403   MCHC 30.7 07/10/2018 0403   RDW 14.0 07/10/2018 0403   RDW 13.6 05/12/2018 1212   LYMPHSABS 2.1 07/06/2018 0703   LYMPHSABS 3.3 (H) 05/12/2018 1212   MONOABS 0.7 07/06/2018 0703   EOSABS 0.0 07/06/2018 0703   EOSABS 0.2 05/12/2018 1212   BASOSABS 0.0  07/06/2018 0703   BASOSABS 0.1 05/12/2018 1212   CBG's (last 3):   Recent Labs    07/10/18 2059 07/11/18 0637 07/11/18 1157  GLUCAP 132* 92 98   LFT's Lab Results  Component Value Date   ALT 19 05/12/2018   AST 25 05/12/2018   ALKPHOS 119 (H) 05/12/2018   BILITOT 0.7 05/12/2018    Studies/Results: No results found.  Medications:  I have reviewed the patient's current medications. Scheduled Medications: . apixaban  5 mg Oral BID  . budesonide (PULMICORT) nebulizer solution  0.5 mg Nebulization BID  . digoxin  0.125 mg Oral Daily  . insulin aspart  0-15 Units Subcutaneous TID PC & HS  . ipratropium-albuterol  3 mL Nebulization BID  . metoprolol tartrate  25 mg Oral BID  . montelukast  10 mg Per Tube QHS  . pantoprazole  40 mg Oral Q1200  . pravastatin  20 mg Oral q1800  . sertraline  25 mg Oral Daily   PRN Medications: acetaminophen **OR** acetaminophen (TYLENOL) oral liquid 160 mg/5 mL **OR** acetaminophen, albuterol, RESOURCE THICKENUP CLEAR  Assessment/Plan: Active Problems:   Left middle cerebral artery stroke (HCC)   Aphasia, post-stroke   1.  Acute left MCA stroke with functional deficits related to sam:  right side hemiparesis, facial droop and aphasia.  Continue inpatient rehab for  physical therapy, Occupational Therapy and speech therapy as needed.  Continue medical management and support as ongoing. 2.  DVT prophylaxis with Eliquis 3. depression.  Continue sertraline daily and continue ego support. 4.  Dysphasia related to #1 continue dysphasia pudding thick liquid and monitor hydration with follow-up speech therapy as ordered. 5.  Essential hypertension.  Continue Toprol daily, changed to short acting to allow compliance with dysphagia diet (crushed meds) 6.  Paroxysmal A. fib.  On digoxin daily with anticoagulation.  Rate controlled at present. 7.  COPD.  Continue home therapy and nebulizers as ordered. 8.  Chronic systolic heart failure.  Currently  appears euvolemic.  Monitor I's/O and daily weights. 9.  Dyslipidemia.  Continue Pravachol 10.  Prediabetes with A1c 5.6.  Monitor glycemic control and cover with sliding scale insulin as needed.  Nutritional support and education to be provided 11.  CAD history.  Continue medical management  Length of stay, days: 1   Samiha Denapoli A. Asa Lente, MD 07/11/2018, 12:41 PM

## 2018-07-11 NOTE — Evaluation (Addendum)
Speech Language Pathology Assessment and Plan  Patient Details  Name: Kaitlyn Underwood MRN: [id number] Date of Birth: Jan 06, 1941  SLP Diagnosis: Aphasia;Dysphagia;Cognitive Impairments  Rehab Potential: Good ELOS: 21 days     Today's Date: 07/11/2018 SLP Individual Time: 1300-1400 SLP Individual Time Calculation (min): 60 min   Problem List:  Patient Active Problem List   Diagnosis Date Noted  . Left middle cerebral artery stroke ([place]) 07/10/2018  . Aphasia, post-stroke   . Acute ischemic left MCA stroke ([place]) 07/06/2018  . Prediabetes 07/06/2018  . Chronic systolic heart failure ([place]) 07/06/2018  . Endotracheally intubated 07/06/2018  . Hyperkalemia 07/06/2018  . Hypercalcemia 07/06/2018  . CKD (chronic kidney disease), stage III ([place]) 07/06/2018  . COPD (chronic obstructive pulmonary disease) ([place]) 07/06/2018  . Middle cerebral artery embolism, left 07/06/2018  . Bilateral carotid bruits 04/03/2017  . Deformity of toe of left foot 12/16/2016  . Acute idiopathic gout involving toe of left foot 11/18/2016  . Contracture of right ankle 03/25/2016  . Foot pain, bilateral 03/25/2016  . Paresthesia of left foot 03/25/2016  . Plantar fasciitis of right foot 03/25/2016  . Chronic atrial fibrillation 05/16/2015  . Coronary artery disease involving native coronary artery of native heart without angina pectoris 05/16/2015  . Diabetes mellitus due to underlying condition with unspecified complications ([place]) 08/65/7846  . Dyslipidemia 05/16/2015  . Essential hypertension 05/16/2015  . Carotid stenosis, left 05/16/2015  . Obesities, morbid ([place]) 05/16/2015   Past Medical History:  Past Medical History:  Diagnosis Date  . Anxiety   . Atrial fibrillation ([place])   . Chronic systolic heart failure ([place])   . COPD (chronic obstructive pulmonary disease) ([place])   . Coronary artery disease   . Hypertension   . Hypothyroidism   . Left renal mass    unconfirmed documentation of "kidney  cancer", possibly cyst  . Pre-diabetes    Past Surgical History:  Past Surgical History:  Procedure Laterality Date  . IR CT HEAD LTD  07/06/2018  . IR PERCUTANEOUS ART THROMBECTOMY/INFUSION INTRACRANIAL INC DIAG ANGIO  07/06/2018  . LEFT HEART CATH AND CORONARY ANGIOGRAPHY N/A 04/29/2017   Procedure: LEFT HEART CATH AND CORONARY ANGIOGRAPHY;  Surgeon: Belva Crome, MD;  Location: [facility];  Service: Cardiovascular;  Laterality: N/A;  . RADIOLOGY WITH ANESTHESIA N/A 07/05/2018   Procedure: RADIOLOGY WITH ANESTHESIA;  Surgeon: Luanne Bras, MD;  Location: [facility];  Service: Radiology;  Laterality: N/A;  . ULTRASOUND GUIDANCE FOR VASCULAR ACCESS  04/29/2017   Procedure: Ultrasound Guidance For Vascular Access;  Surgeon: Belva Crome, MD;  Location: [facility];  Service: Cardiovascular;;    Assessment / Plan / Recommendation Clinical Impression   Kaitlyn Underwood is a 77 year old right handed female history of hypertension,Pre-diabetes, hyperlipidemia,atrial fibrillation maintained on Lanoxin, chronic systolic congestive heart failure, COPD, CAD maintained on aspirin. Patient reportedly lives with spouse. Presented to [facility] 07/06/2018 with right-sided weakness and aphasia. CT/CTA outside hospital showed occlusion of substantial vessel in the left MCA distribution. Patient didn't receive TPA and was transferred to [facility] for further evaluation. MRI the brain showed small to moderate evolving acute left MCA territory infarction with associated petechial hemorrhage without frank hemorrhagic transformation. Negative MRA. Underwent revascularization thrombectomy per interventional radiology. EEG negative for seizure. Echocardiogram with ejection fraction of 30% diffuse hypokinesis. Dysphagia #1 pudding thick liquid diet. Currently maintained on Eliquis for CVA prophylaxis. Therapy evaluations completed with recommendations of physical medicine rehabilitation  consult. Patient was  admitted for a comprehensive rehabilitation program.  SLP evaluation was completed on 07/11/2018 with the following results:  Pt presents with significant improvements in communication and swallowing function in comparison to last ST treatment session.  Pt demonstrates improved automaticity of swallow response and her oral phase for transiting purees and pudding thick liquids is timely and efficient.  No overt s/s of aspiration were evident with presentations of her currently prescribed.  Trials of advanced textures were not administered today given hx of silent aspiration on MBS; however, given presentation at bedside I would suggest trials of honey thick liquids and advanced solids during treatment to continue working towards diet progression.  Currently, pt requires min assist for use of swallowing precautions.   Additionally, pt demonstrates improved verbal expression and was able to answer therapist's questions at the phrase-sentence level with intermittent paraphasic errors noted.  Difficulty becomes more apparent during structured naming tasks and while pt is aware of errors she currently needs mod-max assist to correct.  Cognition was difficult to formally assess due to language deficits; however, pt presents with complaints if increased difficulty with recall and presents with diminished carryover of daily events and information.   Given the abovementioned deficits, pt would benefit from skilled ST while inpatient in order to maximize functional independence and reduce burden of care prior to discharge.  Anticipate that pt will need 24/7 supervision at discharge in addition to [place] follow up at next level of care.     Skilled Therapeutic Interventions          Cognitive-linguistic and bedside swallow evaluation completed with results and recommendations reviewed with patient and family.     SLP Assessment  Patient will need skilled Speech Lanaguage Pathology Services during CIR  admission    Recommendations  SLP Diet Recommendations: Dysphagia 1 (Puree);Pudding Liquid Administration via: Spoon Medication Administration: Crushed with puree Supervision: Patient able to self feed;Full supervision/cueing for compensatory strategies Compensations: Minimize environmental distractions;Slow rate;Small sips/bites Postural Changes and/or Swallow Maneuvers: Out of bed for meals;Seated upright 90 degrees;Upright 30-60 min after meal Oral Care Recommendations: Oral care BID Patient destination: Home Follow up Recommendations: Outpatient SLP;Home Health SLP;24 hour supervision/assistance;Skilled Nursing facility Equipment Recommended: To be determined    SLP Frequency 3 to 5 out of 7 days   SLP Duration  SLP Intensity  SLP Treatment/Interventions 21 days   Minumum of 1-2 x/day, 30 to 90 minutes  Cognitive remediation/compensation;Cueing hierarchy;Dysphagia/aspiration precaution training;Environmental controls;Functional tasks;Internal/external aids;Oral motor exercises;Patient/family education;Speech/Language facilitation    Pain Pain Assessment Pain Scale: 0-10 Pain Score: 0-No pain  Prior Functioning Cognitive/Linguistic Baseline: Within functional limits Type of Home: House  Lives With: Spouse Available Help at Discharge: Family;Available PRN/intermittently  Short Term Goals: Week 1: SLP Short Term Goal 1 (Week 1): Pt will name basic, familiar objects for >75% accuracy with min assist verbal cues.   SLP Short Term Goal 2 (Week 1): Pt will recognize and correct verbal errors with mod assist verbal cues.   SLP Short Term Goal 3 (Week 1): Pt will consume therapeutic trials of honey thick liquids with min cues for use of swallowing precautions and minimal overt s/s of aspiration over 2 consecutive sessions prior to repeat MBS.   SLP Short Term Goal 4 (Week 1): Pt will consume therapeutic trials of dys 2 textures with min cues for use of swallowing precuations over 2  consecutive sessions prior to advancement.   SLP Short Term Goal 5 (Week 1): Pt will utilize external aids to recall basic, daily  information with mod assist multimodal cues.    Refer to Care Plan for Long Term Goals  Recommendations for other services: None   Discharge Criteria: Patient will be discharged from SLP if patient refuses treatment 3 consecutive times without medical reason, if treatment goals not met, if there is a change in medical status, if patient makes no progress towards goals or if patient is discharged from hospital.  The above assessment, treatment plan, treatment alternatives and goals were discussed and mutually agreed upon: by patient and by family  Emilio Math 07/11/2018, 4:37 PM

## 2018-07-11 NOTE — Plan of Care (Signed)
  Problem: RH SAFETY Goal: RH STG ADHERE TO SAFETY PRECAUTIONS W/ASSISTANCE/DEVICE Description STG Adhere to Safety Precautions With Mod I Assistance/Device.  Outcome: Progressing Goal: RH STG DECREASED RISK OF FALL WITH ASSISTANCE Description STG Decreased Risk of Fall With Mod I Assistance.  Outcome: Progressing   Problem: RH KNOWLEDGE DEFICIT Goal: RH STG INCREASE KNOWLEDGE OF DIABETES Description With Min Assistance  Outcome: Progressing Goal: RH STG INCREASE KNOWLEDGE OF HYPERTENSION Description With Min Assistance   Outcome: Progressing Goal: RH STG INCREASE KNOWLEDGE OF DYSPHAGIA/FLUID INTAKE Description With Mod I Assistance   Outcome: Progressing   Problem: Consults Goal: RH STROKE PATIENT EDUCATION Description See Patient Education module for education specifics  Outcome: Not Progressing Goal: Nutrition Consult-if indicated Outcome: Not Progressing Goal: Diabetes Guidelines if Diabetic/Glucose > 140 Description If diabetic or lab glucose is > 140 mg/dl - Initiate Diabetes/Hyperglycemia Guidelines & Document Interventions  Outcome: Not Progressing   Problem: RH BOWEL ELIMINATION Goal: RH STG MANAGE BOWEL WITH ASSISTANCE Description STG Manage Bowel with Mod I Assistance.  Outcome: Not Progressing Goal: RH STG MANAGE BOWEL W/MEDICATION W/ASSISTANCE Description STG Manage Bowel with Medication with Mod I Assistance.  Outcome: Not Progressing   Problem: RH BLADDER ELIMINATION Goal: RH STG MANAGE BLADDER WITH ASSISTANCE Description STG Manage Bladder With Mod I Assistance  Outcome: Not Progressing Goal: RH STG MANAGE BLADDER WITH EQUIPMENT WITH ASSISTANCE Description STG Manage Bladder With Equipment With Mod I Assistance  Outcome: Not Progressing   Problem: RH KNOWLEDGE DEFICIT Goal: RH STG INCREASE KNOWLEGDE OF HYPERLIPIDEMIA Description With Min Assistance   Outcome: Not Progressing Goal: RH STG INCREASE KNOWLEDGE OF STROKE  PROPHYLAXIS Description With Min Assistance   Outcome: Not Progressing

## 2018-07-11 NOTE — Evaluation (Signed)
Occupational Therapy Assessment and Plan  Patient Details  Name: Kaitlyn Underwood MRN: [id number] Date of Birth: January 29, 1941  OT Diagnosis: cognitive deficits, hemiplegia affecting non-dominant side and muscle weakness (generalized) Rehab Potential: Rehab Potential (ACUTE ONLY): Excellent ELOS: 7-9 days   Today's Date: 07/11/2018 OT Individual Time: 1049-1200 OT Individual Time Calculation (min): 71 min     Problem List:  Patient Active Problem List   Diagnosis Date Noted  . Left middle cerebral artery stroke ([place]) 07/10/2018  . Aphasia, post-stroke   . Acute ischemic left MCA stroke ([place]) 07/06/2018  . Prediabetes 07/06/2018  . Chronic systolic heart failure ([place]) 07/06/2018  . Endotracheally intubated 07/06/2018  . Hyperkalemia 07/06/2018  . Hypercalcemia 07/06/2018  . CKD (chronic kidney disease), stage III ([place]) 07/06/2018  . COPD (chronic obstructive pulmonary disease) ([place]) 07/06/2018  . Middle cerebral artery embolism, left 07/06/2018  . Bilateral carotid bruits 04/03/2017  . Deformity of toe of left foot 12/16/2016  . Acute idiopathic gout involving toe of left foot 11/18/2016  . Contracture of right ankle 03/25/2016  . Foot pain, bilateral 03/25/2016  . Paresthesia of left foot 03/25/2016  . Plantar fasciitis of right foot 03/25/2016  . Chronic atrial fibrillation 05/16/2015  . Coronary artery disease involving native coronary artery of native heart without angina pectoris 05/16/2015  . Diabetes mellitus due to underlying condition with unspecified complications ([place]) 56/31/4970  . Dyslipidemia 05/16/2015  . Essential hypertension 05/16/2015  . Carotid stenosis, left 05/16/2015  . Obesities, morbid ([place]) 05/16/2015    Past Medical History:  Past Medical History:  Diagnosis Date  . Anxiety   . Atrial fibrillation ([place])   . Chronic systolic heart failure ([place])   . COPD (chronic obstructive pulmonary disease) ([place])   . Coronary artery disease   . Hypertension   .  Hypothyroidism   . Left renal mass    unconfirmed documentation of "kidney cancer", possibly cyst  . Pre-diabetes    Past Surgical History:  Past Surgical History:  Procedure Laterality Date  . IR CT HEAD LTD  07/06/2018  . IR PERCUTANEOUS ART THROMBECTOMY/INFUSION INTRACRANIAL INC DIAG ANGIO  07/06/2018  . LEFT HEART CATH AND CORONARY ANGIOGRAPHY N/A 04/29/2017   Procedure: LEFT HEART CATH AND CORONARY ANGIOGRAPHY;  Surgeon: Belva Crome, MD;  Location: [facility];  Service: Cardiovascular;  Laterality: N/A;  . RADIOLOGY WITH ANESTHESIA N/A 07/05/2018   Procedure: RADIOLOGY WITH ANESTHESIA;  Surgeon: Luanne Bras, MD;  Location: [facility];  Service: Radiology;  Laterality: N/A;  . ULTRASOUND GUIDANCE FOR VASCULAR ACCESS  04/29/2017   Procedure: Ultrasound Guidance For Vascular Access;  Surgeon: Belva Crome, MD;  Location: [facility];  Service: Cardiovascular;;    Assessment & Plan Clinical Impression: Patient is a 77 y.o. year old female with recent admission to the hospital on 07/06/2018 with right-sided weakness and aphasia. CT/CTA outside hospital showed occlusion of substantial vessel in the left MCA distribution. Patient didn't receive TPA and was transferred to [facility] for further evaluation. MRI the brain showed small to moderate evolving acute left MCA territory infarction with associated petechial hemorrhage without frank hemorrhagic transformation. Negative MRA. Underwent revascularization thrombectomy per interventional radiology .  Patient transferred to CIR on 07/10/2018 .    Patient currently requires min with basic self-care skills secondary to muscle weakness, unbalanced muscle activation and decreased coordination, decreased problem solving and decreased standing balance and decreased balance strategies.  Prior to hospitalization, patient could complete with modified independent .  Patient will benefit from skilled intervention to decrease  level of assist with basic self-care skills and increase independence with basic self-care skills prior to discharge home with care partner.  Anticipate patient will require 24 hour supervision and follow up home health.  OT - End of Session Activity Tolerance: Tolerates 10 - 20 min activity with multiple rests Endurance Deficit: Yes OT Assessment Rehab Potential (ACUTE ONLY): Excellent OT Patient demonstrates impairments in the following area(s): Balance;Cognition;Endurance;Motor;Perception;Safety OT Basic ADL's Functional Problem(s): Eating;Grooming;Bathing;Dressing;Toileting OT Transfers Functional Problem(s): Toilet;Tub/Shower OT Additional Impairment(s): Fuctional Use of Upper Extremity OT Plan OT Intensity: Minimum of 1-2 x/day, 45 to 90 minutes OT Frequency: 5 out of 7 days OT Duration/Estimated Length of Stay: 7-9 days OT Treatment/Interventions: Balance/vestibular training;Discharge planning;Neuromuscular re-education;Patient/family education;Disease mangement/prevention;Self Care/advanced ADL retraining;Therapeutic Exercise;UE/LE Coordination activities;DME/adaptive equipment instruction;Functional mobility training;Pain management;Therapeutic Activities;UE/LE Strength taining/ROM OT Self Feeding Anticipated Outcome(s): Modified independent OT Basic Self-Care Anticipated Outcome(s): supervision OT Toileting Anticipated Outcome(s): supervision OT Bathroom Transfers Anticipated Outcome(s): supervision OT Recommendation Recommendations for Other Services: Neuropsych consult Patient destination: Home Follow Up Recommendations: Home health OT Equipment Recommended: None recommended by OT   Skilled Therapeutic Intervention Pt completed selfcare retraining sit to stand at the sink during session.  She was able to complete supine to sit with min assist and then transfer to the wheelchair with min assist.  Once in the chair, she was able to complete bathing sit to stand with min assist  and mod instructional cueing for sequencing.  She did not want to wash her private area or bottom secondary to having a female therapist.  Re-assured her that this was OK and we would schedule accordingly to accommodate this.  She exhibited slight difficulty with crossing and maintaining the RLE over the left knee to wash her right foot and donn the gripper socks with min assist. Finished session with grooming task of brushing her hair with setup before being left in the wheelchair with safety alarm in place and call button and phone in reach.    OT Evaluation Precautions/Restrictions  Precautions Precautions: Fall Precaution Comments: expressive aphasia Restrictions Weight Bearing Restrictions: No   Pain Pain Assessment Pain Scale: 0-10 Pain Score: 0-No pain Home Living/Prior Functioning Home Living Living Arrangements: Spouse/significant other Available Help at Discharge: Family, Available PRN/intermittently Type of Home: House Home Access: Stairs to enter Technical brewer of Steps: 2 Home Layout: One level Bathroom Shower/Tub: Multimedia programmer: Programmer, systems: Yes  Lives With: Spouse IADL History Homemaking Responsibilities: No Current License: No Occupation: Retired Prior Function Level of Independence: Requires assistive device for independence ADL ADL Eating: Supervision/safety Where Assessed-Eating: Wheelchair Grooming: Supervision/safety Where Assessed-Grooming: Clinical biochemist Bathing: Supervision/safety Where Assessed-Upper Body Bathing: Wheelchair Lower Body Bathing: Minimal assistance Where Assessed-Lower Body Bathing: Wheelchair Upper Body Dressing: Minimal assistance Where Assessed-Upper Body Dressing: Wheelchair Lower Body Dressing: Minimal assistance Where Assessed-Lower Body Dressing: Wheelchair Toileting: Minimal assistance Where Assessed-Toileting: Bedside Commode Toilet Transfer: Minimal assistance Hydrographic surveyor Method: Ambulating Vision Baseline Vision/History: Wears glasses Wears Glasses: Reading only Vision Assessment?: Yes Ocular Range of Motion: Restricted looking up Alignment/Gaze Preference: Within Defined Limits Tracking/Visual Pursuits: Decreased smoothness of horizontal tracking;Decreased smoothness of vertical tracking Convergence: Impaired - to be further tested in functional context Visual Fields: No apparent deficits Perception  Perception: Impaired Inattention/Neglect: Does not attend to right side of body Praxis Praxis: Impaired Praxis Impairment Details: Motor planning Praxis-Other Comments: Decreased ability to complete opposition fo thumb to all digits even with the non-involved left hand.  Cognition Overall Cognitive Status: Impaired/Different from baseline Arousal/Alertness: Awake/alert Orientation Level: Person;Place;Situation Person: Oriented Place: Oriented Situation: Oriented Year: Other (Comment)(1990) Month: December Day of Week: Incorrect Memory: Impaired Memory Impairment: Decreased recall of new information Immediate Memory Recall: Sock;Blue;Bed Memory Recall: Blue;Bed Memory Recall Blue: With Cue Memory Recall Bed: With Cue Attention: Sustained Focused Attention: Appears intact Sustained Attention: Appears intact Awareness: Impaired Awareness Impairment: Intellectual impairment(Pt reported only having speech deficits when asked.) Problem Solving: Impaired Problem Solving Impairment: Functional basic Safety/Judgment: Impaired Sensation Sensation Light Touch: Appears Intact Hot/Cold: Appears Intact Proprioception: Appears Intact Stereognosis: Appears Intact Additional Comments: LE intact Coordination Gross Motor Movements are Fluid and Coordinated: No Fine Motor Movements are Fluid and Coordinated: No Coordination and Movement Description: Noted tremors bilaterally at rest.  Decreased finger to nose accuracy on both sides with more  impairment on the right compared to the left.  Motor  Motor Motor: Hemiplegia Motor - Skilled Clinical Observations: mild R hemi; motor planning deficits Mobility  Bed Mobility Bed Mobility: Rolling Right;Rolling Left;Supine to Sit Rolling Right: Minimal Assistance - Patient > 75% Rolling Left: Minimal Assistance - Patient > 75% Supine to Sit: Minimal Assistance - Patient > 75% Transfers Sit to Stand: Minimal Assistance - Patient > 75%  Trunk/Postural Assessment  Cervical Assessment Cervical Assessment: Within Functional Limits Thoracic Assessment Thoracic Assessment: Within Functional Limits Lumbar Assessment Lumbar Assessment: Within Functional Limits Postural Control Postural Control: Deficits on evaluation(delayed righting reactions)  Balance Balance Balance Assessed: Yes Static Sitting Balance Static Sitting - Balance Support: Feet supported Static Sitting - Level of Assistance: 5: Stand by assistance Dynamic Sitting Balance Dynamic Sitting - Balance Support: Feet supported Dynamic Sitting - Level of Assistance: 4: Min assist Static Standing Balance Static Standing - Balance Support: Bilateral upper extremity supported;During functional activity Static Standing - Level of Assistance: 4: Min assist Dynamic Standing Balance Dynamic Standing - Balance Support: Bilateral upper extremity supported Dynamic Standing - Level of Assistance: 4: Min assist Extremity/Trunk Assessment RUE Assessment RUE Assessment: Exceptions to [facility] Active Range of Motion (AROM) Comments: AROM WFLs for shoulder, elbow, and gross digit flexion and extension.  Increased tremors noted as well as dysmetria with finger to nose.  She was only able to complete opposition of thumb to the first digit.   General Strength Comments: strength 3+/5 in the shoulder, elbow, and grip LUE Assessment LUE Assessment: Exceptions to [facility] Active Range of Motion (AROM) Comments: WFLs for all joints, decreased ability to  oppose the thumb to the last 2 digits.  Increased tremor and dysmetria in the left hand with finger to nose and during ADL.  General Strength Comments: strength 4/5 throughout     Refer to Care Plan for Long Term Goals  Recommendations for other services: None    Discharge Criteria: Patient will be discharged from OT if patient refuses treatment 3 consecutive times without medical reason, if treatment goals not met, if there is a change in medical status, if patient makes no progress towards goals or if patient is discharged from hospital.  The above assessment, treatment plan, treatment alternatives and goals were discussed and mutually agreed upon: by patient  Ludia Gartland OTR/L 07/11/2018, 5:13 PM

## 2018-07-11 NOTE — Evaluation (Signed)
Physical Therapy Assessment and Plan  Patient Details  Name: Kaitlyn Underwood MRN: [id number] Date of Birth: 13-Oct-1940  PT Diagnosis: Abnormal posture, Abnormality of gait, Hemiplegia dominant, Impaired cognition and Muscle weakness Rehab Potential: Good ELOS: 8-10 days   Today's Date: 07/11/2018 PT Individual Time: 0800-0900 PT Individual Time Calculation (min): 60 min    Problem List:  Patient Active Problem List   Diagnosis Date Noted  . Left middle cerebral artery stroke ([place]) 07/10/2018  . Aphasia, post-stroke   . Acute ischemic left MCA stroke ([place]) 07/06/2018  . Prediabetes 07/06/2018  . Chronic systolic heart failure ([place]) 07/06/2018  . Endotracheally intubated 07/06/2018  . Hyperkalemia 07/06/2018  . Hypercalcemia 07/06/2018  . CKD (chronic kidney disease), stage III ([place]) 07/06/2018  . COPD (chronic obstructive pulmonary disease) ([place]) 07/06/2018  . Middle cerebral artery embolism, left 07/06/2018  . Bilateral carotid bruits 04/03/2017  . Deformity of toe of left foot 12/16/2016  . Acute idiopathic gout involving toe of left foot 11/18/2016  . Contracture of right ankle 03/25/2016  . Foot pain, bilateral 03/25/2016  . Paresthesia of left foot 03/25/2016  . Plantar fasciitis of right foot 03/25/2016  . Chronic atrial fibrillation 05/16/2015  . Coronary artery disease involving native coronary artery of native heart without angina pectoris 05/16/2015  . Diabetes mellitus due to underlying condition with unspecified complications ([place]) [id number]/[id number]  . Dyslipidemia 05/16/2015  . Essential hypertension 05/16/2015  . Carotid stenosis, left 05/16/2015  . Obesities, morbid ([place]) 05/16/2015    Past Medical History:  Past Medical History:  Diagnosis Date  . Anxiety   . Atrial fibrillation ([place])   . Chronic systolic heart failure ([place])   . COPD (chronic obstructive pulmonary disease) ([place])   . Coronary artery disease   . Hypertension   . Hypothyroidism   . Left renal  mass    unconfirmed documentation of "kidney cancer", possibly cyst  . Pre-diabetes    Past Surgical History:  Past Surgical History:  Procedure Laterality Date  . IR CT HEAD LTD  07/06/2018  . IR PERCUTANEOUS ART THROMBECTOMY/INFUSION INTRACRANIAL INC DIAG ANGIO  07/06/2018  . LEFT HEART CATH AND CORONARY ANGIOGRAPHY N/A 04/29/2017   Procedure: LEFT HEART CATH AND CORONARY ANGIOGRAPHY;  Surgeon: Belva Crome, MD;  Location: [place] [facility];  Service: Cardiovascular;  Laterality: N/A;  . RADIOLOGY WITH ANESTHESIA N/A 07/05/2018   Procedure: RADIOLOGY WITH ANESTHESIA;  Surgeon: Luanne Bras, MD;  Location: [facility];  Service: Radiology;  Laterality: N/A;  . ULTRASOUND GUIDANCE FOR VASCULAR ACCESS  04/29/2017   Procedure: Ultrasound Guidance For Vascular Access;  Surgeon: Belva Crome, MD;  Location: [place] [facility];  Service: Cardiovascular;;    Assessment & Plan Clinical Impression: YBO:FBPZ Kaitlyn Underwood is a 77 year old right handed female history of hypertension, Pre-diabetes, hyperlipidemia,atrial fibrillation maintained on Lanoxin, chronic systolic congestive heart failure, COPD, CAD maintained on aspirin. Patient reportedly lives with spouse. Presented to [facility]2018 with right-sided weakness and aphasia. CT/CTA outside hospital showed occlusion of substantial vessel in the left MCA distribution. Patient didn't receive TPA and was transferred to [facility] for further evaluation. MRI the brain showed small to moderate evolving acute left MCA territory infarction with associated petechial hemorrhage without frank hemorrhagic transformation. Negative MRA. Underwent revascularization thrombectomy per interventional radiology. EEG negative for seizure. Echocardiogram with ejection fraction of 30% diffuse hypokinesis. Dysphagia #1 pudding thick liquid diet. Currently maintained on Eliquis for CVA prophylaxis. Therapy evaluations completed with recommendations of  physical  medicine rehabilitation consult. Patient was admitted for a comprehensive rehabilitation program. Patient transferred to CIR on 07/10/2018 .   Patient currently requires min with mobility secondary to muscle weakness, impaired timing and sequencing, motor apraxia, decreased coordination and decreased motor planning, decreased attention to right and decreased motor planning, decreased attention, decreased problem solving and decreased safety awareness and decreased standing balance, decreased postural control, hemiplegia and decreased balance strategies.  Prior to hospitalization, patient was modified independent  with mobility and lived with Spouse in a one level home.  Home access is 2Stairs to enter.  Patient will benefit from skilled PT intervention to maximize safe functional mobility, minimize fall risk and decrease caregiver burden for planned discharge home with intermittent assist.  Anticipate patient will benefit from follow up [facility] at discharge.  PT - End of Session Activity Tolerance: Tolerates 30+ min activity with multiple rests Endurance Deficit: Yes PT Assessment Rehab Potential (ACUTE/IP ONLY): Good PT Barriers to Discharge: [place] home environment;Decreased caregiver support;Home environment access/layout;Lack of/limited family support PT Patient demonstrates impairments in the following area(s): Balance;Behavior;Edema;Endurance;Motor;Perception;Safety;Sensory PT Transfers Functional Problem(s): Bed Mobility;Bed to Chair;Car PT Locomotion Functional Problem(s): Ambulation;Wheelchair Mobility;Stairs PT Plan PT Intensity: Minimum of 1-2 x/day ,45 to 90 minutes PT Duration Estimated Length of Stay: 8-10 days PT Treatment/Interventions: Ambulation/gait training;Community reintegration;DME/adaptive equipment instruction;Neuromuscular re-education;Psychosocial support;Stair training;UE/LE Strength taining/ROM;Wheelchair propulsion/positioning;Balance/vestibular  training;Discharge planning;Therapeutic Activities;UE/LE Coordination activities;Cognitive remediation/compensation;Disease management/prevention;Functional mobility training;Patient/family education;Splinting/orthotics;Therapeutic Exercise;Visual/perceptual remediation/compensation PT Transfers Anticipated Outcome(s): supervision level with LRAD PT Locomotion Anticipated Outcome(s): supervision level up to 150' with LRAD PT Recommendation Recommendations for Other Services: Therapeutic Recreation consult Therapeutic Recreation Interventions: Pet therapy;Kitchen group;Stress management Follow Up Recommendations: Home health PT Patient destination: Home Equipment Recommended: To be determined  Skilled Therapeutic Intervention Pt educated on rehab process, plan of care, and safety precautions with pt verbalized understanding/agreement. Completed evaluation and initiated treatment; see details below.   Car transfer performed with RW. Min assist from SPT. Mod cuing for safe sequencing of LE/AD and placement of UE to push up from chair.   Ended session with pt seated in WC, call bell within reach, and all needs met.  PT Evaluation Precautions/Restrictions Precautions Precautions: Fall Precaution Comments: mild R inattention Restrictions Weight Bearing Restrictions: No General   Vital SignsTherapy Vitals Pulse Rate: 60 BP: (!) 172/89 Pain Pain Assessment Pain Scale: 0-10 Pain Score: 0-No pain Home Living/Prior Functioning Home Living Available Help at Discharge: Family;Available PRN/intermittently Type of Home: House Home Access: Stairs to enter [employer] of Steps: 2 Home Layout: One level Bathroom Toilet: Standard Bathroom Accessibility: Yes  Lives With: Spouse Prior Function Level of Independence: Independent with basic ADLs;Requires assistive device for independence(pt reports use of a RW around house as PLOF) Vision/Perception  Perception Perception:  Impaired(R inattention) Praxis Praxis: Impaired Praxis Impairment Details: Motor planning Praxis-Other Comments: motor planning deficits and impulsiveness noted  Cognition Overall Cognitive Status: Difficult to assess(difficult to assess secondary to aphasia) Sensation Sensation Light Touch: Appears Intact Additional Comments: LE intact Coordination Gross Motor Movements are Fluid and Coordinated: No Coordination and Movement Description: impaired Motor  Motor Motor: Hemiplegia;Motor apraxia Motor - Skilled Clinical Observations: mild R hemi; motor planning deficits  Mobility Bed Mobility Bed Mobility: Rolling Right;Rolling Left;Supine to Sit Rolling Right: Contact Guard/Touching assist Rolling Left: Minimal Assistance - Patient > 75% Supine to Sit: Minimal Assistance - Patient > 75% Transfers Transfers: Sit to Stand;Squat Pivot Transfers;Stand Pivot Transfers Sit to Stand: Minimal Assistance - Patient > 75% Stand Pivot Transfers: Minimal Assistance - Patient >  75% Stand Pivot Transfer Details: Tactile cues for initiation;Tactile cues for posture;Verbal cues for sequencing;Manual facilitation for placement;Tactile cues for sequencing;Tactile cues for placement;Verbal cues for technique;Verbal cues for safe use of DME/AE;Verbal cues for precautions/safety Stand Pivot Transfer Details (indicate cue type and reason): min A with RW; impulsive Squat Pivot Transfers: Minimal Assistance - Patient > 75% Transfer (Assistive device): Rolling walker Locomotion  Gait Ambulation: Yes Gait Assistance: Minimal Assistance - Patient > 75% Gait Distance (Feet): 50 Feet Assistive device: Rolling walker Gait Assistance Details: Tactile cues for posture;Verbal cues for sequencing;Verbal cues for technique;Verbal cues for safe use of DME/AE;Tactile cues for weight shifting;Verbal cues for precautions/safety;Manual facilitation for weight shifting Gait Gait: Yes Gait Pattern: Impaired Gait  Pattern: Decreased step length - left;Decreased stance time - right;Decreased hip/knee flexion - right;Decreased dorsiflexion - right;Decreased weight shift to right;Narrow base of support;Step-to pattern;Right flexed knee in stance;Decreased trunk rotation Gait velocity: diminished  Stairs / Additional Locomotion Stairs: Yes Stairs Assistance: Minimal Assistance - Patient > 75% Stair Management Technique: Two rails Number of Stairs: 4 Height of Stairs: 3 Wheelchair Mobility Wheelchair Mobility: Yes Wheelchair Assistance: Development worker, international aid: Both upper extremities Wheelchair Parts Management: Needs assistance Distance: 50  Trunk/Postural Assessment  Cervical Assessment Cervical Assessment: Within Functional Limits Thoracic Assessment Thoracic Assessment: Within Functional Limits Lumbar Assessment Lumbar Assessment: Within Functional Limits Postural Control Postural Control: Deficits on evaluation(delayed righting reactions)  Balance Balance Balance Assessed: Yes Static Sitting Balance Static Sitting - Balance Support: Feet supported Static Sitting - Level of Assistance: 5: Stand by assistance Dynamic Sitting Balance Dynamic Sitting - Balance Support: Feet supported Dynamic Sitting - Level of Assistance: 4: Min Insurance risk surveyor Standing - Balance Support: Bilateral upper extremity supported Static Standing - Level of Assistance: 4: Min assist Dynamic Standing Balance Dynamic Standing - Balance Support: Bilateral upper extremity supported Dynamic Standing - Level of Assistance: 4: Min assist Extremity Assessment      RLE Assessment RLE Assessment: Exceptions to [facility] General Strength Comments: grossly, 4-/5 LLE Assessment LLE Assessment: Exceptions to [facility] General Strength Comments: grossly, 4/5    Refer to Care Plan for Long Term Goals  Recommendations for other services: Therapeutic Recreation  Pet therapy, Kitchen  group and Stress management  Discharge Criteria: Patient will be discharged from PT if patient refuses treatment 3 consecutive times without medical reason, if treatment goals not met, if there is a change in medical status, if patient makes no progress towards goals or if patient is discharged from hospital.  The above assessment, treatment plan, treatment alternatives and goals were discussed and mutually agreed upon: by patient  Amador Cunas 07/11/2018, 1:33 PM

## 2018-07-12 ENCOUNTER — Inpatient Hospital Stay (HOSPITAL_COMMUNITY): Payer: PPO

## 2018-07-12 ENCOUNTER — Inpatient Hospital Stay (HOSPITAL_COMMUNITY): Payer: PPO | Admitting: Physical Therapy

## 2018-07-12 DIAGNOSIS — I5022 Chronic systolic (congestive) heart failure: Secondary | ICD-10-CM

## 2018-07-12 DIAGNOSIS — J410 Simple chronic bronchitis: Secondary | ICD-10-CM

## 2018-07-12 DIAGNOSIS — I10 Essential (primary) hypertension: Secondary | ICD-10-CM

## 2018-07-12 LAB — GLUCOSE, CAPILLARY
GLUCOSE-CAPILLARY: 83 mg/dL (ref 70–99)
GLUCOSE-CAPILLARY: 122 mg/dL — AB (ref 70–99)
Glucose-Capillary: 86 mg/dL (ref 70–99)
Glucose-Capillary: 94 mg/dL (ref 70–99)

## 2018-07-12 MED ORDER — ORAL CARE MOUTH RINSE
15.0000 mL | Freq: Two times a day (BID) | OROMUCOSAL | Status: DC
  Administered 2018-07-12 – 2018-07-25 (×23): 15 mL via OROMUCOSAL

## 2018-07-12 MED ORDER — SODIUM CHLORIDE 0.9 % IV SOLN
Freq: Every day | INTRAVENOUS | Status: DC
  Administered 2018-07-12 – 2018-07-14 (×3): via INTRAVENOUS

## 2018-07-12 NOTE — Progress Notes (Signed)
Occupational Therapy Session Note  Patient Details  Name: Kaitlyn Underwood MRN: [id number] Date of Birth: Jun 15, 1941  Today's Date: 07/12/2018 OT Individual Time: 4469-5072 OT Individual Time Calculation (min): 60 min    Short Term Goals: Week 1:  OT Short Term Goal 1 (Week 1): Pt's STGs are equal to LTGs set at supervision level.    Skilled Therapeutic Interventions/Progress Updates:    Pt resting in bed upon arrival and agreeable to participating in therapy.  Pt requested to don bra before transitioning to gym.  Pt attempted to fasten bra but required assistance to complete tasks.  Pt noted with decreased RUE [place] and slight dysmetria. Pt transitioned to therapy gym for RUE FMC/gross motor tasks seated unsupported on mat.  All functional transfers with min A and max verbal cue for sequencing and safety awareness.  Pt completed tasks of grasping and placing resistive clothes pins on dowels and grasping/placing large pegs on peg board.  Pt requires more than a reasonable amount of time to complete all tasks with rest breaks.  Pt remained seated in w/c for PT handoff.   Therapy Documentation Precautions:  Precautions Precautions: Fall Precaution Comments: expressive aphasia Restrictions Weight Bearing Restrictions: No Pain: Pain Assessment Pain Scale: 0-10 Pain Score: 0-No pain   Therapy/Group: Individual Therapy  Leroy Libman 07/12/2018, 11:05 AM

## 2018-07-12 NOTE — Plan of Care (Signed)
  Problem: Consults Goal: RH STROKE PATIENT EDUCATION Description See Patient Education module for education specifics  Outcome: Progressing Goal: Nutrition Consult-if indicated Outcome: Progressing Goal: Diabetes Guidelines if Diabetic/Glucose > 140 Description If diabetic or lab glucose is > 140 mg/dl - Initiate Diabetes/Hyperglycemia Guidelines & Document Interventions  Outcome: Progressing   Problem: RH BOWEL ELIMINATION Goal: RH STG MANAGE BOWEL WITH ASSISTANCE Description STG Manage Bowel with Mod I Assistance.  Outcome: Progressing Goal: RH STG MANAGE BOWEL W/MEDICATION W/ASSISTANCE Description STG Manage Bowel with Medication with Mod I Assistance.  Outcome: Progressing   Problem: RH SAFETY Goal: RH STG ADHERE TO SAFETY PRECAUTIONS W/ASSISTANCE/DEVICE Description STG Adhere to Safety Precautions With Mod I Assistance/Device.  Outcome: Progressing Goal: RH STG DECREASED RISK OF FALL WITH ASSISTANCE Description STG Decreased Risk of Fall With Mod I Assistance.  Outcome: Progressing   Problem: RH KNOWLEDGE DEFICIT Goal: RH STG INCREASE KNOWLEDGE OF DIABETES Description With Min Assistance  Outcome: Progressing Goal: RH STG INCREASE KNOWLEDGE OF HYPERTENSION Description With Min Assistance   Outcome: Progressing Goal: RH STG INCREASE KNOWLEDGE OF DYSPHAGIA/FLUID INTAKE Description With Mod I Assistance   Outcome: Progressing Goal: RH STG INCREASE KNOWLEGDE OF HYPERLIPIDEMIA Description With Min Assistance   Outcome: Progressing Goal: RH STG INCREASE KNOWLEDGE OF STROKE PROPHYLAXIS Description With Min Assistance   Outcome: Progressing   Problem: RH BLADDER ELIMINATION Goal: RH STG MANAGE BLADDER WITH ASSISTANCE Description STG Manage Bladder With Mod I Assistance  Outcome: Not Progressing Goal: RH STG MANAGE BLADDER WITH EQUIPMENT WITH ASSISTANCE Description STG Manage Bladder With Equipment With Mod I Assistance  Outcome: Not Progressing

## 2018-07-12 NOTE — Progress Notes (Signed)
Kaitlyn Underwood is a 77 y.o. female [birth year] [id number]  Subjective: Reports feeling well today.  No breathing problems and no change in level of intake or strength reported  Objective: Vital signs in last 24 hours: Temp:  [98 F (36.7 C)-98.2 F (36.8 C)] 98 F (36.7 C) (11/24 0430) Pulse Rate:  [54-104] 60 (11/24 0430) Resp:  [16-18] 18 (11/23 1942) BP: (148-172)/(53-89) 148/53 (11/24 0430) SpO2:  [94 %-98 %] 98 % (11/24 0831) Weight change:  Last BM Date: 07/11/18  Intake/Output from previous day: 11/23 0701 - 11/24 0700 In: 1081.1 [P.O.:420; I.V.:661.1] Out: -   Physical Exam General: No apparent distress, lying supine in bed, RN in room Lungs: Normal effort. Lungs clear to auscultation with moderate air movement bilaterally, no crackles or wheezes. Cardiovascular: Regular rate and rhythm, no edema Neurological: No new neurological deficits   Lab Results: BMET    Component Value Date/Time   NA 143 07/10/2018 0403   NA 141 05/12/2018 1212   K 4.0 07/10/2018 0403   CL 113 (H) 07/10/2018 0403   CO2 23 07/10/2018 0403   GLUCOSE 88 07/10/2018 0403   BUN 15 07/10/2018 0403   BUN 25 05/12/2018 1212   CREATININE 0.88 07/10/2018 0403   CALCIUM 9.6 07/10/2018 0403   GFRNONAA >60 07/10/2018 0403   GFRAA >60 07/10/2018 0403   CBC    Component Value Date/Time   WBC 8.6 07/10/2018 0403   RBC 3.44 (L) 07/10/2018 0403   HGB 10.3 (L) 07/10/2018 0403   HGB 13.7 05/12/2018 1212   HCT 33.6 (L) 07/10/2018 0403   HCT 40.8 05/12/2018 1212   PLT 377 07/10/2018 0403   PLT 414 05/12/2018 1212   MCV 97.7 07/10/2018 0403   MCV 94 05/12/2018 1212   MCH 29.9 07/10/2018 0403   MCHC 30.7 07/10/2018 0403   RDW 14.0 07/10/2018 0403   RDW 13.6 05/12/2018 1212   LYMPHSABS 2.1 07/06/2018 0703   LYMPHSABS 3.3 (H) 05/12/2018 1212   MONOABS 0.7 07/06/2018 0703   EOSABS 0.0 07/06/2018 0703   EOSABS 0.2 05/12/2018 1212   BASOSABS 0.0 07/06/2018 0703   BASOSABS 0.1 05/12/2018 1212    CBG's (last 3):   Recent Labs    07/11/18 1658 07/11/18 2116 07/12/18 0627  GLUCAP 96 123* 83   LFT's Lab Results  Component Value Date   ALT 19 05/12/2018   AST 25 05/12/2018   ALKPHOS 119 (H) 05/12/2018   BILITOT 0.7 05/12/2018    Studies/Results: No results found.  Medications:  I have reviewed the patient's current medications. Scheduled Medications: . apixaban  5 mg Oral BID  . budesonide (PULMICORT) nebulizer solution  0.5 mg Nebulization BID  . digoxin  0.125 mg Oral Daily  . insulin aspart  0-15 Units Subcutaneous TID PC & HS  . ipratropium-albuterol  3 mL Nebulization BID  . metoprolol tartrate  25 mg Oral BID  . montelukast  10 mg Per Tube QHS  . pantoprazole  40 mg Oral Q1200  . pravastatin  20 mg Oral q1800  . sertraline  25 mg Oral Daily   PRN Medications: acetaminophen **OR** acetaminophen (TYLENOL) oral liquid 160 mg/5 mL **OR** acetaminophen, albuterol, RESOURCE THICKENUP CLEAR  Assessment/Plan: Principal Problem:   Left middle cerebral artery stroke [facility]) Active Problems:   Chronic atrial fibrillation   Coronary artery disease involving native coronary artery of native heart without angina pectoris   Essential hypertension   Chronic systolic heart failure (HCC)   COPD (chronic obstructive pulmonary  disease) ([place])   Aphasia, post-stroke   1.  Acute left MCA stroke with functional deficits related to sam:  right side hemiparesis, facial droop and aphasia.  Continue inpatient rehab for physical therapy, Occupational Therapy and speech therapy as needed.  Continue medical management and support as ongoing. 2.  DVT prophylaxis with Eliquis 3. depression.  Continue sertraline daily and continue ego support. 4.  Dysphasia related to #1 continue dysphasia pudding thick liquid and monitor hydration with follow-up speech therapy as ordered.  We will change IV fluid hydration to overnight only to increase opportunity for untethered mobility during the  day. 5.  Essential hypertension.  Continue Toprol daily, changed to short acting to allow compliance with dysphagia diet (crushed meds) 6.  Paroxysmal A. fib.  On digoxin daily with anticoagulation.  Rate controlled at present. 7.  COPD.  Continue home therapy and nebulizers as ordered. 8.  Chronic systolic heart failure.  Currently appears euvolemic.  Monitor I's/O and daily weights. 9.  Dyslipidemia.  Continue Pravachol 10.  Prediabetes with A1c 5.6.  Monitor glycemic control and cover with sliding scale insulin as needed.  Nutritional support and education to be provided 11.  CAD history.  Continue medical management  Length of stay, days: 2   Valerie A. Asa Lente, MD 07/12/2018, 10:29 AM

## 2018-07-12 NOTE — Progress Notes (Signed)
Occupational Therapy Session Note  Patient Details  Name: Kaitlyn Underwood MRN: [id number] Date of Birth: September 05, 1940  Today's Date: 07/12/2018 OT Individual Time: 0700-0800 OT Individual Time Calculation (min): 60 min    Short Term Goals: Week 1:  OT Short Term Goal 1 (Week 1): Pt's STGs are equal to LTGs set at supervision level.    Skilled Therapeutic Interventions/Progress Updates:    1;1. Pt received seated in bed with no c/o pain. Pt requires MOD A or stand pivot transfer with no AD and manual facilitation of hand placement. Pt hooked up to continuous I and unable to get in shower at this time. Pt bathes sit to stand at EOB with min A for transitional movements and balance while washing peri area and buttocks. Pt completes oral care seated in w/c with set up. Pt able to locate all needed items at sink. Pt requires increased time or all movements d/t mild ataxia and dysmetria RUE. Pt requires VC for hemi techniques and figure 4 for threading BLE into pants. Pt dons shirt with CGA for keeping sleeve from pulling on IV. Pt combs hair with VC fvor combing back of head. PT dons non skid socks with supervision/VC. Exited session with pt seated in bed, call light in reach and all needs met.  Therapy Documentation Precautions:  Precautions Precautions: Fall Precaution Comments: expressive aphasia Restrictions Weight Bearing Restrictions: No General:   Vital Signs: Therapy Vitals Temp: 98 F (36.7 C) Temp Source: Oral Pulse Rate: 60 BP: (!) 148/53 Patient Position (if appropriate): Lying Oxygen Therapy SpO2: 98 % O2 Device: Room Air Pain:   ADL: ADL Eating: Supervision/safety Where Assessed-Eating: Wheelchair Grooming: Supervision/safety Where Assessed-Grooming: Clinical biochemist Bathing: Supervision/safety Where Assessed-Upper Body Bathing: Wheelchair Lower Body Bathing: Minimal assistance Where Assessed-Lower Body Bathing: Wheelchair Upper Body Dressing: Minimal  assistance Where Assessed-Upper Body Dressing: Wheelchair Lower Body Dressing: Minimal assistance Where Assessed-Lower Body Dressing: Wheelchair Toileting: Minimal assistance Where Assessed-Toileting: Bedside Commode Toilet Transfer: Minimal assistance Toilet Transfer Method: Ambulating Vision   Perception    Praxis   Exercises:   Other Treatments:     Therapy/Group: Individual Therapy  Tonny Branch 07/12/2018, 7:13 AM

## 2018-07-12 NOTE — Progress Notes (Signed)
Physical Therapy Session Note  Patient Details  Name: Kaitlyn Underwood MRN: [id number] Date of Birth: April 26, 1941  Today's Date: 07/12/2018 PT Individual Time: 1015-1108 PT Individual Time Calculation (min): 53 min   Short Term Goals: Week 1:  PT Short Term Goal 1 (Week 1): Pt will perform stand pivot transfer with RW at [facility] level PT Short Term Goal 2 (Week 1): Pt will ambulate up to 150' with LRAD at min assist level PT Short Term Goal 3 (Week 1): Pt will ascend/descend 4, 6" stairs using 2 handrails at min assist level  Skilled Therapeutic Interventions/Progress Updates:    pt handed off from OT.  Pt performs sit <> stand with min/mod A with RW, gait with RW with min A, forward flexed trunk, cues for posture throughout. Pt able to perform gait x 50' before fatigued.  Pt performs standing reaching tasks with focus on upright trunk, posture and glute and back strengthening. Pt min A for balance with RW.  [place] exercise program initiated for strength and balance training, pt fatigues easily during this and requires frequent rest breaks.  nustep x 6 minutes for LE/UE strength and coordination.  Pt left in room with alarm set, needs at hand, family present.  Therapy Documentation Precautions:  Precautions Precautions: Fall Precaution Comments: expressive aphasia Restrictions Weight Bearing Restrictions: No Pain: Pain Assessment Pain Scale: 0-10 Pain Score: 0-No pain   Therapy/Group: Individual Therapy  DONAWERTH,KAREN 07/12/2018, 11:09 AM

## 2018-07-13 ENCOUNTER — Inpatient Hospital Stay (HOSPITAL_COMMUNITY): Payer: PPO

## 2018-07-13 ENCOUNTER — Inpatient Hospital Stay (HOSPITAL_COMMUNITY): Payer: PPO | Admitting: Physical Therapy

## 2018-07-13 ENCOUNTER — Inpatient Hospital Stay (HOSPITAL_COMMUNITY): Payer: PPO | Admitting: Speech Pathology

## 2018-07-13 DIAGNOSIS — I482 Chronic atrial fibrillation, unspecified: Secondary | ICD-10-CM

## 2018-07-13 LAB — CBC WITH DIFFERENTIAL/PLATELET
ABS IMMATURE GRANULOCYTES: 0.04 10*3/uL (ref 0.00–0.07)
BASOS ABS: 0 10*3/uL (ref 0.0–0.1)
Basophils Relative: 0 %
EOS PCT: 1 %
Eosinophils Absolute: 0.1 10*3/uL (ref 0.0–0.5)
HEMATOCRIT: 36.3 % (ref 36.0–46.0)
HEMOGLOBIN: 11.6 g/dL — AB (ref 12.0–15.0)
Immature Granulocytes: 0 %
LYMPHS ABS: 2.3 10*3/uL (ref 0.7–4.0)
LYMPHS PCT: 22 %
MCH: 30.8 pg (ref 26.0–34.0)
MCHC: 32 g/dL (ref 30.0–36.0)
MCV: 96.3 fL (ref 80.0–100.0)
MONO ABS: 0.7 10*3/uL (ref 0.1–1.0)
Monocytes Relative: 7 %
NEUTROS ABS: 7 10*3/uL (ref 1.7–7.7)
Neutrophils Relative %: 70 %
Platelets: 412 10*3/uL — ABNORMAL HIGH (ref 150–400)
RBC: 3.77 MIL/uL — AB (ref 3.87–5.11)
RDW: 13.6 % (ref 11.5–15.5)
WBC: 10.2 10*3/uL (ref 4.0–10.5)
nRBC: 0 % (ref 0.0–0.2)

## 2018-07-13 LAB — COMPREHENSIVE METABOLIC PANEL
ALBUMIN: 3.3 g/dL — AB (ref 3.5–5.0)
ALK PHOS: 74 U/L (ref 38–126)
ALT: 20 U/L (ref 0–44)
AST: 24 U/L (ref 15–41)
Anion gap: 6 (ref 5–15)
BILIRUBIN TOTAL: 0.9 mg/dL (ref 0.3–1.2)
BUN: 12 mg/dL (ref 8–23)
CALCIUM: 9.4 mg/dL (ref 8.9–10.3)
CO2: 26 mmol/L (ref 22–32)
CREATININE: 0.93 mg/dL (ref 0.44–1.00)
Chloride: 112 mmol/L — ABNORMAL HIGH (ref 98–111)
GFR calc Af Amer: >60 mL/min (ref >60)
GFR calc non Af Amer: 58 mL/min — ABNORMAL LOW (ref >60)
GLUCOSE: 105 mg/dL — AB (ref 70–99)
Potassium: 3.8 mmol/L (ref 3.5–5.1)
SODIUM: 144 mmol/L (ref 135–145)
TOTAL PROTEIN: 5.9 g/dL — AB (ref 6.5–8.1)

## 2018-07-13 LAB — GLUCOSE, CAPILLARY
Glucose-Capillary: 93 mg/dL (ref 70–99)
Glucose-Capillary: 109 mg/dL — ABNORMAL HIGH (ref 70–99)
Glucose-Capillary: 89 mg/dL (ref 70–99)
Glucose-Capillary: 97 mg/dL (ref 70–99)

## 2018-07-13 NOTE — Progress Notes (Signed)
Speech Language Pathology Daily Session Note  Patient Details  Name: Kaitlyn Underwood MRN: [id number] Date of Birth: 12-22-1940  Today's Date: 07/13/2018 SLP Individual Time: 0720-0800 SLP Individual Time Calculation (min): 40 min  Short Term Goals: Week 1: SLP Short Term Goal 1 (Week 1): Pt will name basic, familiar objects for >75% accuracy with min assist verbal cues.   SLP Short Term Goal 2 (Week 1): Pt will recognize and correct verbal errors with mod assist verbal cues.   SLP Short Term Goal 3 (Week 1): Pt will consume therapeutic trials of honey thick liquids with min cues for use of swallowing precautions and minimal overt s/s of aspiration over 2 consecutive sessions prior to repeat MBS.   SLP Short Term Goal 4 (Week 1): Pt will consume therapeutic trials of dys 2 textures with min cues for use of swallowing precuations over 2 consecutive sessions prior to advancement.   SLP Short Term Goal 5 (Week 1): Pt will utilize external aids to recall basic, daily information with mod assist multimodal cues.    Skilled Therapeutic Interventions: Skilled treatment session focused on dysphagia and communication goals. SLP facilitated session by providing overall Mod-Max A verbal cues for use of swallowing compensatory strategies in regards to multiple swallows, slow rate and small bites/sips resulting in intermittent throat clearing. Recommend patient continue current diet. SLP also facilitated session by providing extra time and overall Min A verbal cues for patient to verbalize at the phrase and sentence level in regards to basic wants/needs and mildly abstract thoughts. Patient became tearful and SLP provided support and encouragement. Patient left upright in bed with alarm on and all needs within reach. Continue with current plan of care.      Pain Pain Assessment Pain Scale: 0-10 Pain Score: 0-No pain  Therapy/Group: Individual Therapy  Kenleigh Toback 07/13/2018, 1:17 PM

## 2018-07-13 NOTE — Progress Notes (Signed)
Physical Therapy Session Note  Patient Details  Name: Kaitlyn Underwood MRN: [id number] Date of Birth: 1941/02/20  Today's Date: 07/13/2018 PT Individual Time: 9480-1655 PT Individual Time Calculation (min): 29 min   Short Term Goals: Week 1:  PT Short Term Goal 1 (Week 1): Pt will perform stand pivot transfer with RW at [facility] level PT Short Term Goal 2 (Week 1): Pt will ambulate up to 150' with LRAD at min assist level PT Short Term Goal 3 (Week 1): Pt will ascend/descend 4, 6" stairs using 2 handrails at min assist level  Skilled Therapeutic Interventions/Progress Updates:  Pt received in w/c & agreeable to tx. Transported pt to/from gym via w/c dependent assist for time management. Pt completes sit>stand with min assist with cuing for hand placement and max assist for stand>sit 2/2 decreased safety awareness & fatigue. Pt ambulates 15 ft + 15 ft with RW with heavy min assist with pt demonstrating R knee anterior instability and R lateral lean. Pt requires max assist to safely turn to sit on mat table and no awareness regarding need to reach back with BUE. In BI gym, attempted to have pt stand on foam but pt requires max assist for static standing balance with BUE support on compliant surface therefore had pt stand on floor with 1UE support on RW and min assist while engaging in dynavision with task focusing on standing balance with pt electing to use RUE. Pt only able to engage in task for ~2 minutes + 2 minutes before requiring seated rest break. Pt with no recall of education regarding hand placement for sit>stand transfers and requires max cuing. At end of session pt left sitting in w/c in room with chair alarm donned & call bell in reach.   Pt asking for water and therapist educated husband in room that pt is not allowed to have regular liquids with him voicing understanding. Notified nursing staff of pt's request.   Therapy Documentation Precautions:  Precautions Precautions: Fall Precaution  Comments: expressive aphasia Restrictions Weight Bearing Restrictions: No  Pain: Pt denied c/o pain.    Therapy/Group: Individual Therapy  Waunita Schooner 07/13/2018, 4:04 PM

## 2018-07-13 NOTE — Progress Notes (Signed)
Retta Diones, RN  Rehab Admission Coordinator  Physical Medicine and Rehabilitation  PMR Pre-admission  Signed  Date of Service:  07/10/2018 1:20 PM       Related encounter: Admission (Discharged) from 07/05/2018 in [place] NEURO/TRAUMA/SURGICAL ICU      Signed         Show:Clear all '[x]'$ Manual'[x]'$ Template'[x]'$ Copied  Added by: '[x]'$ [place] Silveria, Evalee Mutton, RN  '[]'$ Hover for details PMR Admission Coordinator Pre-Admission Assessment  Patient: Kaitlyn Underwood is an 77 y.o., female MRN: [id number] DOB: Sep 08, 1940 Height: '5\' 2"'$  (157.5 cm) Weight: 90.7 kg                                                                                                                                                  Insurance Information HMO:     PPO: Yes     PCP:       IPA:       80/20:       OTHER:   PRIMARY: [employer]      Policy#: [id number]      Subscriber: patient CM Name: RN has epic access and can get clinicals      Phone#: [phone]     Fax#: [phone] Pre-Cert#: [id number]      Employer: Not employed Benefits:  Phone #: [phone]     Name: On line portal Eff. Date: 08/19/17     Deduct:  $0      Out of Pocket Max: $3100 (met $239.95)      Life Max: N/A CIR: $250 day 1; $125 days 2-6      SNF: $0 days 1-20; $160 days 21-100 Outpatient:  Medical necessity     Co-Pay: $10/visit Home Health: 100%      Co-Pay: none DME: 80%     Co-Pay: 20% Providers: in network  Medicaid Application Date:        Case Manager:   Disability Application Date:        Case Worker:    Emergency Contact Information         Contact Information    Name Relation Home Work Mobile   [name] Daughter   [phone]   [name], [name] Spouse [phone]       Current Medical History  Patient Admitting Diagnosis:  L MCA infarct  History of Present Illness: A 77 y.o.right handedfemalewith history of hypertension, hyperlipidemia, chronic systolic congestive heart failure, COPD, CAD  maintained on aspirin. Patient reportedly lives with spouse.[facility] 07/06/2018 with right-sided weakness and aphasia. CT/CTA outside hospital showed occlusion of a substantial vessel in the left MCA distribution. Patient didn't receive TPA and was transferred to [facility] for further evaluation. MRI the brain showed small to moderate evolving acute left MCA territory infarction with associated petechial hemorrhage without frank hemorrhagic transformation. Negative MRA. Underwent revascularization thrombectomy per interventional radiology of  left M2 occlusion. EEG negative for seizure. Echocardiogram with ejection fraction of 30% diffuse hypokinesis. Dysphagia #1 pudding thick liquid diet. Currently maintained on Eliquis for CVA prophylaxis. Therapy evaluations completed with recommendations of physical medicine rehabilitation consult.  Complete NIHSS TOTAL: 6  Past Medical History      Past Medical History:  Diagnosis Date  . Anxiety   . Atrial fibrillation ([place])   . Chronic systolic heart failure ([place])   . COPD (chronic obstructive pulmonary disease) ([place])   . Coronary artery disease   . Hypertension   . Hypothyroidism   . Left renal mass    unconfirmed documentation of "kidney cancer", possibly cyst  . Pre-diabetes     Family History  family history includes Arrhythmia in her sister; Heart attack in her maternal uncle; Heart disease in her brother.  Prior Rehab/Hospitalizations: Had 20 outpatient visits for weakness in 01/19  Has the patient had major surgery during 100 days prior to admission? No  Current Medications   Current Facility-Administered Medications:  .  0.9 %  sodium chloride infusion, , Intravenous, Continuous, Rosalin Hawking, MD .  acetaminophen (TYLENOL) tablet 650 mg, 650 mg, Oral, Q4H PRN **OR** acetaminophen (TYLENOL) solution 650 mg, 650 mg, Per Tube, Q4H PRN **OR** acetaminophen (TYLENOL) suppository 650 mg, 650 mg,  Rectal, Q4H PRN, Rosalin Hawking, MD .  albuterol (PROVENTIL) (2.5 MG/3ML) 0.083% nebulizer solution 2.5 mg, 2.5 mg, Nebulization, Q2H PRN, Renee Pain, MD .  apixaban (ELIQUIS) tablet 5 mg, 5 mg, Oral, BID, Rosalin Hawking, MD, 5 mg at 07/10/18 1033 .  budesonide (PULMICORT) nebulizer solution 0.5 mg, 0.5 mg, Nebulization, BID, Jennelle Human B, NP, 0.5 mg at 07/10/18 0736 .  chlorhexidine (PERIDEX) 0.12 % solution 15 mL, 15 mL, Mouth Rinse, BID, Rosalin Hawking, MD, 15 mL at 07/10/18 1035 .  digoxin (LANOXIN) tablet 0.125 mg, 0.125 mg, Oral, Daily, Rosalin Hawking, MD, 0.125 mg at 07/10/18 1048 .  docusate (COLACE) 50 MG/5ML liquid 100 mg, 100 mg, Oral, BID PRN, Rosalin Hawking, MD .  insulin aspart (novoLOG) injection 0-15 Units, 0-15 Units, Subcutaneous, TID PC & HS, Rosalin Hawking, MD .  ipratropium-albuterol (DUONEB) 0.5-2.5 (3) MG/3ML nebulizer solution 3 mL, 3 mL, Nebulization, BID, Rosalin Hawking, MD, 3 mL at 07/10/18 0737 .  labetalol (NORMODYNE,TRANDATE) injection 10 mg, 10 mg, Intravenous, Once PRN **AND** [DISCONTINUED] nicardipine (CARDENE) '20mg'$  in 0.86% saline 260m IV infusion (0.1 mg/ml), 0-15 mg/hr, Intravenous, Continuous PRN, KGreta Doom MD .  MEDLINE mouth rinse, 15 mL, Mouth Rinse, q12n4p, XRosalin Hawking MD, 15 mL at 07/09/18 1810 .  metoprolol succinate (TOPROL-XL) 24 hr tablet 50 mg, 50 mg, Oral, Daily, Agarwala, Ravi, MD, 50 mg at 07/10/18 1034 .  montelukast (SINGULAIR) tablet 10 mg, 10 mg, Per Tube, QHS, SJennelle HumanB, NP, 10 mg at 07/09/18 2113 .  pantoprazole (PROTONIX) EC tablet 40 mg, 40 mg, Oral, Q1200, BErick Colace NP, 40 mg at 07/10/18 1259 .  pravastatin (PRAVACHOL) tablet 20 mg, 20 mg, Oral, q1800, XRosalin Hawking MD, 20 mg at 07/09/18 1809 .  RESOURCE THICKENUP CLEAR, , Oral, PRN, XRosalin Hawking MD .  sertraline (ZOLOFT) tablet 25 mg, 25 mg, Oral, Daily, BErick Colace NP, 25 mg at 07/10/18 1033  Patients Current Diet:     Diet Order                  DIET -  DYS 1 Room service appropriate? Yes; Fluid consistency: Pudding Thick  Diet effective now  Precautions / Restrictions Precautions Precautions: Fall Precaution Comments: left gaze preference, right inattention Restrictions Weight Bearing Restrictions: No   Has the patient had 2 or more falls or a fall with injury in the past year?Yes.  Daughter reports 4 falls with bruising/ minor injury.  Prior Activity Level Limited Community (1-2x/wk): Went out 2-3 X a week, not working, not driving.  Home Assistive Devices / Equipment Home Equipment: Walker - 2 wheels, Shower seat  Prior Device Use: Indicate devices/aids used by the patient prior to current illness, exacerbation or injury? Walker and furniture walks  Prior Functional Level Prior Function Level of Independence: Needs assistance Gait / Transfers Assistance Needed: uses RW for gait and can walk in stores but WC for mall or larger community distances ADL's / Homemaking Assistance Needed: she bathes and dresses with seating rests and family does the homemaking, does not drive Comments: assumed independent, chart did not reveal any prior therapy, no family present to report.   Self Care: Did the patient need help bathing, dressing, using the toilet or eating?  Independent  Indoor Mobility: Did the patient need assistance with walking from room to room (with or without device)? Independent  Stairs: Did the patient need assistance with internal or external stairs (with or without device)? Independent          Functional Cognition: Did the patient need help planning regular tasks such as shopping or remembering to take medications? Independent  Current Functional Level Cognition  Arousal/Alertness: Awake/alert Overall Cognitive Status: Impaired/Different from baseline Current Attention Level: Sustained Orientation Level: Oriented X4 Following Commands: Follows one step commands with increased  time Safety/Judgement: Decreased awareness of safety, Decreased awareness of deficits General Comments: right inattention with aphasia, stating it is monday, unable to state month after education. Difficulty recalling who granddaughter is and required increased time Attention: Focused, Sustained Focused Attention: Appears intact Sustained Attention: Appears intact Memory: (further assess) Awareness: (further assess) Problem Solving: (further assess) Executive Function: Initiating Initiating: Impaired(able to initiate suctioning) Initiating Impairment: Verbal basic Behaviors: Lability Safety/Judgment: Impaired    Extremity Assessment (includes Sensation/Coordination)  Upper Extremity Assessment: RUE deficits/detail RUE Deficits / Details: using funcitonally wiht isolated joint movment; generalized weakness with decreased coordination RUE Coordination: decreased fine motor, decreased gross motor  Lower Extremity Assessment: Defer to PT evaluation RLE Deficits / Details: right leg with functional weakness as observed when moving to EOB (unable to lift R leg significantly against gravity to progress it to EOB without assistance, some instability in standing and stepping around having difficulty progressing it.      ADLs  Overall ADL's : Needs assistance/impaired Grooming: Moderate assistance, Brushing hair Upper Body Bathing: Moderate assistance, Sitting Lower Body Bathing: Maximal assistance, +2 for physical assistance, Sit to/from stand Upper Body Dressing : Moderate assistance, Sitting Lower Body Dressing: +2 for physical assistance, Sit to/from stand, Total assistance Toilet Transfer: Minimal assistance, Ambulation, RW, Grab bars, Comfort height toilet Toilet Transfer Details (indicate cue type and reason): more difficulty with spatial management with turns Toileting- Clothing Manipulation and Hygiene: Moderate assistance Functional mobility during ADLs: Rolling walker, Cueing for  safety, Moderate assistance(running RW into wall but able to correct once wall was hit) General ADL Comments: limited by aphasia, cognition, R sided weakness, and impaired balance     Mobility  Overal bed mobility: Needs Assistance Bed Mobility: Supine to Sit Supine to sit: Mod assist, HOB elevated General bed mobility comments: in chair on arrival     Transfers  Overall transfer level: Needs  assistance Equipment used: Rolling walker (2 wheeled) Transfers: Sit to/from Stand Sit to Stand: Min assist Stand pivot transfers: Mod assist General transfer comment: increased assistance required; vc to position sefl inside of RW    Ambulation / Gait / Stairs / Wheelchair Mobility  Ambulation/Gait Ambulation/Gait assistance: Min assist, +2 safety/equipment Gait Distance (Feet): 60 Feet Assistive device: Rolling walker (2 wheeled) Gait Pattern/deviations: Step-through pattern, Decreased stride length, Trunk flexed General Gait Details: cues for safety, posture, position in RW directing RW toward left and attending to right. Pt veering Right with RW despite therapist on that side and cues, chair to follow with fatigue limiting gait. Per family does not walk long distances at baseline.     Posture / Balance Dynamic Sitting Balance Sitting balance - Comments:  close S and cues due to R lateral lean, anterior lean Balance Overall balance assessment: Needs assistance Sitting-balance support: Feet unsupported Sitting balance-Leahy Scale: Good Sitting balance - Comments:  close S and cues due to R lateral lean, anterior lean Standing balance support: Bilateral upper extremity supported Standing balance-Leahy Scale: Poor Standing balance comment: reliant on external support    Special needs/care consideration BiPAP/CPAP No CPM No Continuous Drip IV No Dialysis No        Life Vest No Oxygen No Special Bed No Trach Size No Wound Vac (area) No       Skin No                             Bowel mgmt: Last BM 07/08/18 Bladder mgmt: Has purwick external catheter in place Diabetic mgmt:  Is prediabetic, has been on insulin in acute hospital setting    Previous Home Environment Living Arrangements: Children  Lives With: Pincus Badder) Available Help at Discharge: Family, Available PRN/intermittently Type of Home: House Home Layout: One level Home Access: Stairs to enter Technical brewer of Steps: 2 Bathroom Toilet: Standard Additional Comments: Pt unable to report due to severe expressive aphasia.    Discharge Living Setting Plans for Discharge Living Setting: Patient's home, House, Lives with (comment)(Lives with husband.) Type of Home at Discharge: Mobile home(Double wide mobile home.) Discharge Home Layout: One level(Has 1 step down to bedroom) Discharge Home Access: Stairs to enter Entrance Stairs-Number of Steps: 2 step entry Discharge Bathroom Shower/Tub: Tub/shower unit, Curtain Discharge Bathroom Toilet: Standard Discharge Bathroom Accessibility: No Does the patient have any problems obtaining your medications?: No  Social/Family/Support Systems Patient Roles: Spouse, Parent, Other (Comment)(Has a husband, 5 daughters, 1 grand daughter.) Contact Information: [name] - daughter - [phone] Anticipated Caregiver: All five daughters and grand daughter Ability/Limitations of Caregiver: Dtr Maudie Mercury lives next door and not working.  Dtr Santiago Glad in [place].  Dtr Juliann Pulse is first call and coordinates care.  Husband defers to his daughters.  Angie daughter is in [place]. Caregiver Availability: 24/7 Discharge Plan Discussed with Primary Caregiver: Yes Is Caregiver In Agreement with Plan?: Yes Does Caregiver/Family have Issues with Lodging/Transportation while Pt is in Rehab?: No  Goals/Additional Needs Patient/Family Goal for Rehab: PT/OT/SLP min assist goals Expected length of stay: 18-25 days Cultural Considerations: [employer] Dietary Needs: Dys 1,  pudding thick liquids Equipment Needs: TBD Pt/Family Agrees to Admission and willing to participate: Yes Program Orientation Provided & Reviewed with Pt/Caregiver Including Roles  & Responsibilities: Yes  Decrease burden of Care through IP rehab admission: N/A  Possible need for SNF placement upon discharge: Not anticipated  Patient Condition: This patient's condition remains as  documented in the consult dated 07/09/18, in which the Rehabilitation Physician determined and documented that the patient's condition is appropriate for intensive rehabilitative care in an inpatient rehabilitation facility. Will admit to inpatient rehab today.  Preadmission Screen Completed By:  Retta Diones, 07/10/2018 1:37 PM ______________________________________________________________________   Discussed status with Dr. Naaman Plummer on 07/10/18 at 1337 and received telephone approval for admission today.  Admission Coordinator:  Retta Diones, time 1337/Date 07/10/18           Cosigned by: Meredith Staggers, MD at 07/10/2018 1:43 PM  Revision History

## 2018-07-13 NOTE — Progress Notes (Signed)
Occupational Therapy Session Note  Patient Details  Name: Kaitlyn Underwood MRN: [id number] Date of Birth: 06/14/1941  Today's Date: 07/13/2018 OT Individual Time: 1300-1355 OT Individual Time Calculation (min): 55 min    Short Term Goals: Week 1:  OT Short Term Goal 1 (Week 1): Pt's STGs are equal to LTGs set at supervision level.    Skilled Therapeutic Interventions/Progress Updates:    Pt resting in bed upon arrival with RN present.  Pt declined bathing/dressing stating she prefers a female therapist assist.  Notation on schedule board for future OT sessions. OT intervention with focus on functional transfers, sitting balance, sit<>stand, standing balance, BUE FMC tasks, activity tolerance, and safety awareness to increase independence with BADLs.  Response to questions inconsistent secondary to language deficits.  Pt follows one step functional commands with min verbal cues but exhibited difficulty following commands for peg board activity.  Pt able to open and fasten variety of medicine bottles with no difficulty but exhibited difficulty grasping and placing large pegs on peg board.  Pt required mod verbal cues for replicating moderately difficult pattern on peg board.  Pt performed sit<>stand X 5 with RW at CGA level.  Functional transfers varied from mod A to min A.  Mod A without verbal cues for sequencing and safety and min A with min verbal cues for sequencing and safety.  Pt returned to room and remained seated in w/c with belt alarm activated and needs within reach.   Therapy Documentation Precautions:  Precautions Precautions: Fall Precaution Comments: expressive aphasia Restrictions Weight Bearing Restrictions: No Pain:  Pt denies pain   Therapy/Group: Individual Therapy  Leroy Libman 07/13/2018, 2:00 PM

## 2018-07-13 NOTE — Progress Notes (Signed)
[place] PHYSICAL MEDICINE & REHABILITATION PROGRESS NOTE   Subjective/Complaints:  Feels "bad " but cannot state why Pt is oriented to self and rehab Had BM this am ROS  Neg for CP, SOB, N/V/D, or body aches or pains  Objective:   No results found. No results for input(s): WBC, HGB, HCT, PLT in the last 72 hours. No results for input(s): NA, K, CL, CO2, GLUCOSE, BUN, CREATININE, CALCIUM in the last 72 hours.  Intake/Output Summary (Last 24 hours) at 07/13/2018 0839 Last data filed at 07/13/2018 0827 Gross per 24 hour  Intake 530 ml  Output -  Net 530 ml     Physical Exam: Vital Signs Blood pressure (!) 151/64, pulse (!) 54, temperature 98.9 F (37.2 C), temperature source Oral, resp. rate 18, height 5\' 2"  (1.575 m), weight 90.4 kg, SpO2 99 %.   General: No acute distress Mood and affect are appropriate Heart: Regular rate and rhythm no rubs murmurs or extra sounds Lungs: Clear to auscultation, breathing unlabored, no rales or wheezes Abdomen: Positive bowel sounds, soft nontender to palpation, nondistended Extremities: No clubbing, cyanosis, or edema Skin: No evidence of breakdown, no evidence of rash Neurologic: Cranial nerves II through XII intact, motor strength is 5/5 in Left and 4/5 on Right  deltoid, bicep, tricep, grip, hip flexor, knee extensors, ankle dorsiflexor and plantar flexor Sensory exam normal sensation to light touch and proprioception in bilateral upper and lower extremities Cerebellar exam normal finger to nose to finger as well as heel to shin in bilateral upper and lower extremities Musculoskeletal: Full range of motion in all 4 extremities. No joint swelling   Assessment/Plan: 1. Functional deficits secondary to Left MCA infarct  which require 3+ hours per day of interdisciplinary therapy in a comprehensive inpatient rehab setting.  Physiatrist is providing close team supervision and 24 hour management of active medical problems listed  below.  Physiatrist and rehab team continue to assess barriers to discharge/monitor patient progress toward functional and medical goals  Care Tool:  Bathing    Body parts bathed by patient: Right arm, Left arm, Chest, Abdomen, Right upper leg, Left upper leg, Right lower leg, Left lower leg, Face     Body parts n/a: Buttocks, Front perineal area(did not attempt secondary to female therapist)   Bathing assist Assist Level: Total Assistance - Patient < 25%     Upper Body Dressing/Undressing Upper body dressing   What is the patient wearing?: Hospital gown only    Upper body assist Assist Level: Moderate Assistance - Patient 50 - 74%    Lower Body Dressing/Undressing Lower body dressing      What is the patient wearing?: Incontinence brief     Lower body assist Assist for lower body dressing: Maximal Assistance - Patient 25 - 49%     Toileting Toileting    Toileting assist Assist for toileting: Moderate Assistance - Patient 50 - 74%     Transfers Chair/bed transfer  Transfers assist  Chair/bed transfer activity did not occur: Safety/medical concerns  Chair/bed transfer assist level: Moderate Assistance - Patient 50 - 74%     Locomotion Ambulation   Ambulation assist      Assist level: Minimal Assistance - Patient > 75% Assistive device: Walker-rolling Max distance: 50   Walk 10 feet activity   Assist     Assist level: Minimal Assistance - Patient > 75% Assistive device: Walker-rolling   Walk 50 feet activity   Assist    Assist level: Minimal Assistance -  Patient > 75% Assistive device: Walker-rolling    Walk 150 feet activity   Assist Walk 150 feet activity did not occur: Safety/medical concerns         Walk 10 feet on uneven surface  activity   Assist Walk 10 feet on uneven surfaces activity did not occur: Safety/medical concerns         Wheelchair     Assist   Type of Wheelchair: Manual    Wheelchair assist level:  Contact Guard/Touching assist Max wheelchair distance: 50    Wheelchair 50 feet with 2 turns activity    Assist        Assist Level: Contact Guard/Touching assist   Wheelchair 150 feet activity     Assist Wheelchair 150 feet activity did not occur: Safety/medical concerns        Medical Problem List and Plan: 1.Right side weakness with facial droop and aphasiasecondary to left MCA infarction -CIR PT, OT SLP 2. DVT Prophylaxis/Anticoagulation: Eliquis 3. Pain Management:Tylenol as needed 4. Mood:Zoloft 25 mg daily 5. Neuropsych: This patientiscapable of making decisions on herown behalf. 6. Skin/Wound Care:Routine skin checks 7. Fluids/Electrolytes/Nutrition:Routine in and out's with follow-up chemistries 8. Dysphagia. Dysphagia #1 pudding thick liquids. Monitor hydration. Follow-up speech therapy -BMET on admit 9. Hypertension. Toprol 50 mg daily Vitals:   07/12/18 2201 07/13/18 0452  BP:  (!) 151/64  Pulse:  (!) 54  Resp:  18  Temp:  98.9 F (37.2 C)  SpO2: 97% 99%  mildly elevated systolic permissive x 1 wk 10. Atrial fibrillation. Lanoxin 0.125 mg daily. Cardiac rate controlled 11.COPD.Continue nebulizers 12.Chronic systolic congestive heart failure.Monitor for any signs of fluid overload, no peripheral edema or rales  -daily weights 13. Hyperlipidemia. Pravachol 14. Pre-diabetes. Hemoglobin A1c 5.6. SSI    LOS: 3 days A FACE TO FACE EVALUATION WAS PERFORMED  Kaitlyn Underwood 07/13/2018, 8:39 AM

## 2018-07-13 NOTE — Progress Notes (Signed)
Meredith Staggers, MD  Physician  Physical Medicine and Rehabilitation  Consult Note  Signed  Date of Service:  07/09/2018 6:04 AM       Related encounter: Admission (Discharged) from 07/05/2018 in [facility] NEURO/TRAUMA/SURGICAL ICU      Signed      Expand All Collapse All    Show:Clear all [x] Manual[x] Template[] Copied  Added by: [x] Angiulli, Lavon Paganini, PA-C[x] Meredith Staggers, MD  [] Hover for details      Physical Medicine and Rehabilitation Consult Reason for Consult: Right-sided weakness and aphasia Referring Physician: Dr.Xu   HPI: Kaitlyn Underwood is a 77 y.o.right handed female with history of hypertension, hyperlipidemia, chronic systolic congestive heart failure, COPD, CAD maintained on aspirin. Patient reportedly lives with spouse.[facility] 07/06/2018 with right-sided weakness and aphasia. CT/CTA outside hospital showed occlusion of a substantial vessel in the left MCA distribution. Patient didn't receive TPA and was transferred to [facility] for further evaluation. MRI the brain showed small to moderate evolving acute left MCA territory infarction with associated petechial hemorrhage without frank hemorrhagic transformation. Negative MRA. Underwent revascularization thrombectomy per interventional radiology of left M2 occlusion. EEG negative for seizure. Echocardiogram with ejection fraction of 30% diffuse hypokinesis. Dysphagia #1 pudding thick liquid diet. Currently maintained on Eliquis for CVA prophylaxis. Therapy evaluations completed with recommendations of physical medicine rehabilitation consult.   Review of Systems  Unable to perform ROS: Language       Past Medical History:  Diagnosis Date  . Anxiety   . Atrial fibrillation ([place])   . Chronic systolic heart failure ([place])   . COPD (chronic obstructive pulmonary disease) ([place])   . Coronary artery disease   . Hypertension   . Hypothyroidism   . Left renal mass     unconfirmed documentation of "kidney cancer", possibly cyst  . Pre-diabetes         Past Surgical History:  Procedure Laterality Date  . IR CT HEAD LTD  07/06/2018  . IR PERCUTANEOUS ART THROMBECTOMY/INFUSION INTRACRANIAL INC DIAG ANGIO  07/06/2018  . LEFT HEART CATH AND CORONARY ANGIOGRAPHY N/A 04/29/2017   Procedure: LEFT HEART CATH AND CORONARY ANGIOGRAPHY;  Surgeon: Belva Crome, MD;  Location: [place] CV LAB;  Service: Cardiovascular;  Laterality: N/A;  . RADIOLOGY WITH ANESTHESIA N/A 07/05/2018   Procedure: RADIOLOGY WITH ANESTHESIA;  Surgeon: Luanne Bras, MD;  Location: [facility];  Service: Radiology;  Laterality: N/A;  . ULTRASOUND GUIDANCE FOR VASCULAR ACCESS  04/29/2017   Procedure: Ultrasound Guidance For Vascular Access;  Surgeon: Belva Crome, MD;  Location: [place] CV [facility];  Service: Cardiovascular;;        Family History  Problem Relation Age of Onset  . Arrhythmia Sister   . Heart disease Brother   . Heart attack Maternal Uncle    Social History:  reports that she quit smoking about 28 years ago. Her smoking use included cigarettes. She has a 20.00 pack-year smoking history. She has never used smokeless tobacco. She reports that she does not drink alcohol or use drugs. Allergies:  Allergies  Allergen Reactions  . Sulfa Antibiotics Anaphylaxis    tongue         Medications Prior to Admission  Medication Sig Dispense Refill  . albuterol (PROVENTIL HFA;VENTOLIN HFA) 108 (90 Base) MCG/ACT inhaler Inhale 2 puffs into the lungs every 6 (six) hours as needed for wheezing or shortness of breath.     . Ascorbic Acid (VITAMIN C) 1000 MG tablet Take 1,000 mg by  mouth 2 (two) times daily.    Marland Kitchen aspirin EC 325 MG tablet Take 1 tablet (325 mg total) by mouth daily. 30 tablet 0  . budesonide (PULMICORT) 0.5 MG/2ML nebulizer solution Take 0.5 mg by nebulization daily.     . captopril (CAPOTEN) 50 MG tablet TAKE 1 TABLET BY MOUTH THREE TIMES  DAILY (Patient taking differently: Take 50 mg by mouth 3 (three) times daily. ) 270 tablet 1  . digoxin (LANOXIN) 0.125 MG tablet TAKE 1 TABLET BY MOUTH ONCE DAILY (Patient taking differently: Take 0.125 mg by mouth daily. ) 90 tablet 1  . febuxostat (ULORIC) 40 MG tablet Take 40 mg by mouth daily.  12  . furosemide (LASIX) 40 MG tablet Take 40 mg by mouth daily.    Marland Kitchen gabapentin (NEURONTIN) 300 MG capsule Take 300 mg by mouth 3 (three) times daily.  2  . Glucosamine Sulfate 500 MG TABS Take 500 mg by mouth daily.    Marland Kitchen ipratropium-albuterol (DUONEB) 0.5-2.5 (3) MG/3ML SOLN Take 3 mLs by nebulization every 6 (six) hours as needed (shortness of breath/wheezing.).     Marland Kitchen metoprolol succinate (TOPROL-XL) 50 MG 24 hr tablet Take 1 tablet (50 mg total) by mouth daily. 90 tablet 0  . montelukast (SINGULAIR) 10 MG tablet Take 10 mg by mouth daily.    . nitroGLYCERIN (NITROSTAT) 0.4 MG SL tablet Place 0.4 mg under the tongue every 5 (five) minutes as needed for chest pain.     . pantoprazole (PROTONIX) 40 MG tablet Take 40 mg by mouth daily.    . traMADol (ULTRAM) 50 MG tablet Take 50 mg by mouth daily as needed for moderate pain.     . vitamin E 400 UNIT capsule Take 400 Units by mouth daily.    . captopril (CAPOTEN) 50 MG tablet Take 1 tablet (50 mg total) by mouth 3 (three) times daily. (Patient not taking: Reported on 07/06/2018) 270 tablet 3    Home: Home Living Family/patient expects to be discharged to:: Private residence Available Help at Discharge: (UTA) Type of Home: (UTA) Additional Comments: Pt unable to report due to severe expressive aphasia.    Lives With: (UTA)  Functional History: Prior Function Comments: assumed independent, chart did not reveal any prior therapy, no family present to report.  Functional Status:  Mobility: Bed Mobility Overal bed mobility: Needs Assistance Bed Mobility: Supine to Sit Supine to sit: Mod assist, HOB elevated General bed  mobility comments: see PT note Transfers Overall transfer level: Needs assistance Equipment used: 2 person hand held assist Transfers: Sit to/from Stand, Stand Pivot Transfers Sit to Stand: +2 physical assistance, Mod assist Stand pivot transfers: +2 physical assistance, Mod assist General transfer comment: mod assist +2 for transfers to L (strong side) to [facility] then recliner, cueing for sequencing, safety and posture Ambulation/Gait General Gait Details: unable to safely at this time.   ADL: ADL Overall ADL's : Needs assistance/impaired Grooming: Minimal assistance, Sitting Upper Body Bathing: Moderate assistance, Sitting Lower Body Bathing: Maximal assistance, +2 for physical assistance, Sit to/from stand Upper Body Dressing : Moderate assistance, Sitting Lower Body Dressing: +2 for physical assistance, Sit to/from stand, Total assistance Toilet Transfer: Moderate assistance, +2 for physical assistance, BSC, Stand-pivot, Cueing for safety, Cueing for sequencing Toileting- Clothing Manipulation and Hygiene: Total assistance, +2 for physical assistance, Sit to/from stand Functional mobility during ADLs: Moderate assistance, +2 for physical assistance, Cueing for safety, Cueing for sequencing General ADL Comments: limited by aphasia, cognition, R sided weakness, and impaired  balance   Cognition: Cognition Overall Cognitive Status: Difficult to assess Arousal/Alertness: Awake/alert Orientation Level: Oriented to person, Disoriented to place, Disoriented to time, Disoriented to situation Attention: Focused, Sustained Focused Attention: Appears intact Sustained Attention: Appears intact Memory: (further assess) Awareness: (further assess) Problem Solving: (further assess) Executive Function: Initiating Initiating: Impaired(able to initiate suctioning) Initiating Impairment: Verbal basic Behaviors: Lability Safety/Judgment: Impaired Cognition Arousal/Alertness:  Awake/alert Behavior During Therapy: WFL for tasks assessed/performed Overall Cognitive Status: Difficult to assess Area of Impairment: Attention, Memory, Following commands, Safety/judgement, Awareness, Problem solving Current Attention Level: Sustained Memory: Decreased recall of precautions Following Commands: Follows one step commands with increased time Safety/Judgement: Decreased awareness of safety, Decreased awareness of deficits Awareness: Intellectual Problem Solving: Decreased initiation, Difficulty sequencing, Requires verbal cues, Requires tactile cues General Comments: approx 75% accuracy to yes/no questions, difficulty sequencing tasks  Blood pressure (!) 145/75, pulse 60, temperature 98.1 F (36.7 C), temperature source Axillary, resp. rate 12, height 5\' 2"  (1.575 m), weight 90.7 kg, SpO2 98 %. Physical Exam  Vitals reviewed. Constitutional:  obese  HENT:  Head: Normocephalic.  Eyes: Pupils are equal, round, and reactive to light.  Neck: Normal range of motion.  Cardiovascular: Normal rate.  Respiratory: Effort normal.  GI: Soft.  Musculoskeletal: She exhibits edema.  Neurological:  Patient is alert and makes eye contact with examiner. She is expressively aphasic. She did follow one step verbal commands easily. She was able to utter a few simple words spontaneously. Answered yes/no questions about 75% for simple information. RUE 3/5 prox to distal. RLE 3/5. LUE and LLE 4+ to 5/5. No focal sensory findings, sensed pain in all 4's.  Skin:  Multiple ecchymoses on arms  Psychiatric:  Pleasant and cooperative          Assessment/Plan: Diagnosis: left MCA infarct with right hemiparesis 1. Does the need for close, 24 hr/day medical supervision in concert with the patient's rehab needs make it unreasonable for this patient to be served in a less intensive setting? Yes 2. Co-Morbidities requiring supervision/potential complications: HTN, chf, copd, cad 3. Due to  bladder management, bowel management, safety, skin/wound care, disease management, medication administration, pain management and patient education, does the patient require 24 hr/day rehab nursing? Yes 4. Does the patient require coordinated care of a physician, rehab nurse, PT (1-2 hrs/day, 5 days/week), OT (1-2 hrs/day, 5 days/week) and SLP (1-2 hrs/day, 5 days/week) to address physical and functional deficits in the context of the above medical diagnosis(es)? Yes Addressing deficits in the following areas: balance, endurance, locomotion, strength, transferring, bowel/bladder control, bathing, dressing, feeding, grooming, toileting, cognition, language, swallowing and psychosocial support 5. Can the patient actively participate in an intensive therapy program of at least 3 hrs of therapy per day at least 5 days per week? Yes 6. The potential for patient to make measurable gains while on inpatient rehab is excellent 7. Anticipated functional outcomes upon discharge from inpatient rehab are min assist  with PT, min assist with OT, min assist with SLP. 8. Estimated rehab length of stay to reach the above functional goals is: 18-25 days 9. Anticipated D/C setting: Home 10. Anticipated post D/C treatments: HH therapy and Outpatient therapy 11. Overall Rehab/Functional Prognosis: excellent  RECOMMENDATIONS: This patient's condition is appropriate for continued rehabilitative care in the following setting: CIR Patient has agreed to participate in recommended program. Yes Note that insurance prior authorization may be required for reimbursement for recommended care.  Comment: Rehab Admissions Coordinator to follow up.  Thanks,  Alroy Dust T.  Naaman Plummer, MD, Mellody Drown  I have personally performed a face to face diagnostic evaluation of this patient. Additionally, I have reviewed and concur with the physician assistant's documentation above.    Lavon Paganini Angiulli, PA-C 07/09/2018        Revision  History                   Routing History

## 2018-07-13 NOTE — IPOC Note (Addendum)
Overall Plan of Care [facility]) Patient Details Name: Kaitlyn Underwood MRN: [id number] DOB: Dec 18, 1940  Admitting Diagnosis: Left middle cerebral artery stroke [facility])  Hospital Problems: Principal Problem:   Left middle cerebral artery stroke [facility]) Active Problems:   Chronic atrial fibrillation   Coronary artery disease involving native coronary artery of native heart without angina pectoris   Essential hypertension   Chronic systolic heart failure (HCC)   COPD (chronic obstructive pulmonary disease) (HCC)   Aphasia, post-stroke     Functional Problem List: Nursing    PT Balance, Behavior, Edema, Endurance, Motor, Perception, Safety, Sensory  OT Balance, Cognition, Endurance, Motor, Perception, Safety  SLP Linguistic, Nutrition, Cognition  TR         Basic ADL's: OT Eating, Grooming, Bathing, Dressing, Toileting     Advanced  ADL's: OT       Transfers: PT Bed Mobility, Bed to Chair, Teacher, early years/pre, Tub/Shower     Locomotion: PT Ambulation, Emergency planning/management officer, Stairs     Additional Impairments: OT Fuctional Use of Upper Extremity  SLP Communication, Social Cognition, Swallowing expression Memory, Awareness, Problem Solving  TR      Anticipated Outcomes Item Anticipated Outcome  Self Feeding Modified independent  Swallowing  Supervision    Basic self-care  supervision  Toileting  supervision   Bathroom Transfers supervision  Bowel/Bladder     Transfers  supervision level with LRAD  Locomotion  supervision level up to 150' with LRAD  Communication  Min assist   Cognition  Supervision   Pain     Safety/Judgment      Therapy Plan: PT Intensity: Minimum of 1-2 x/day ,45 to 90 minutes PT Duration Estimated Length of Stay: 8-10 days OT Intensity: Minimum of 1-2 x/day, 45 to 90 minutes OT Frequency: 5 out of 7 days OT Duration/Estimated Length of Stay: 7-9 days SLP Intensity: Minumum of 1-2 x/day, 30 to 90 minutes SLP Frequency: 3 to 5 out of 7 days SLP  Duration/Estimated Length of Stay: 21 days     Team Interventions: Nursing Interventions    PT interventions Ambulation/gait training, Academic librarian, Engineer, drilling, Neuromuscular re-education, Psychosocial support, IT trainer, UE/LE Strength taining/ROM, Wheelchair propulsion/positioning, Training and development officer, Discharge planning, Therapeutic Activities, UE/LE Coordination activities, Cognitive remediation/compensation, Disease management/prevention, Functional mobility training, Patient/family education, Splinting/orthotics, Therapeutic Exercise, Visual/perceptual remediation/compensation  OT Interventions Training and development officer, Discharge planning, Neuromuscular re-education, Patient/family education, Disease mangement/prevention, Self Care/advanced ADL retraining, Therapeutic Exercise, UE/LE Coordination activities, DME/adaptive equipment instruction, Functional mobility training, Pain management, Therapeutic Activities, UE/LE Strength taining/ROM  SLP Interventions Cognitive remediation/compensation, Cueing hierarchy, Dysphagia/aspiration precaution training, Environmental controls, Functional tasks, Internal/external aids, Oral motor exercises, Patient/family education, Speech/Language facilitation  TR Interventions    SW/CM Interventions Discharge Planning, Psychosocial Support, Patient/Family Education   Barriers to Discharge MD  Medical stability  Nursing      PT Inaccessible home environment, Decreased caregiver support, Home environment access/layout, Lack of/limited family support    OT      SLP      SW       Team Discharge Planning: Destination: PT-Home ,OT- Home , SLP-Home Projected Follow-up: PT-Home health PT, OT-  Home health OT, SLP-Outpatient SLP, Home Health SLP, 24 hour supervision/assistance, Skilled Nursing facility Projected Equipment Needs: PT-To be determined, OT- None recommended by OT, SLP-To be determined Equipment  Details: PT- , OT-  Patient/family involved in discharge planning: PT- Patient,  OT-Patient, SLP-Patient, Family member/caregiver  MD ELOS: 13-18d Medical Rehab Prognosis:  Good Assessment:  77 year old right handed  female history of hypertension,Pre-diabetes, hyperlipidemia,atrial fibrillation maintained on Lanoxin, chronic systolic congestive heart failure, COPD, CAD maintained on aspirin. Patient reportedly lives with spouse. Presented to [facility] 07/06/2018 with right-sided weakness and aphasia. CT/CTA outside hospital showed occlusion of substantial vessel in the left MCA distribution. Patient didn't receive TPA and was transferred to [facility] for further evaluation. MRI the brain showed small to moderate evolving acute left MCA territory infarction with associated petechial hemorrhage without frank hemorrhagic transformation. Negative MRA. Underwent revascularization thrombectomy per interventional radiology. EEG negative for seizure. Echocardiogram with ejection fraction of 30% diffuse hypokinesis. Dysphagia #1 pudding thick liquid diet. Currently maintained on Eliquis for CVA prophylaxis.    Now requiring 24/7 Rehab RN,MD, as well as CIR level PT, OT and SLP.  Treatment team will focus on ADLs and mobility with goals set at Sup See Team Conference Notes for weekly updates to the plan of care

## 2018-07-13 NOTE — Progress Notes (Signed)
Physical Therapy Session Note  Patient Details  Name: Kaitlyn Underwood MRN: [id number] Date of Birth: Oct 25, 1940  Today's Date: 07/13/2018 PT Individual Time: 0900-1012 PT Individual Time Calculation (min): 72 min   Short Term Goals: Week 1:  PT Short Term Goal 1 (Week 1): Pt will perform stand pivot transfer with RW at [facility] level PT Short Term Goal 2 (Week 1): Pt will ambulate up to 150' with LRAD at min assist level PT Short Term Goal 3 (Week 1): Pt will ascend/descend 4, 6" stairs using 2 handrails at min assist level  Skilled Therapeutic Interventions/Progress Updates:    Pt supine in bed upon PT arrival, agreeable to therapy tx and denies pain. Pt donned pants with assist to loop LEs through, performed sit<>stand with mod assist to pull pants over hips. Pt performed stand pivot to w/c with mod assist and transported to the gym. Pt performed sit<>stand with RW and min assist, attempts to ambulate but sits back down into w/c secondary to fatigue. Pt attempted one more trial, she reports "I can't." Pt also short of breath this session. Pt performed stand pivot to mat with mod assist. Pt worked on standing tolerance/balance to perform card matching activity, min assist with RW. Pt ambulated x 40 ft with RW mod assist initially due to R LE buckling, fading to min assist, verbal cues for upright posture and decreased reliance on UEs. Pt transported to dayroom, stand pivot to nustep with mod assist. Pt used nustep x 8 minutes on workload 4 for global strengthening and endurance. Stand pivot back to w/c with mod assist, transported to gym and transferred to mat mod assist. Pt worked on sit<>stands and standing without AD, requiring mod-max assist to stand, manual facilitation for increased hip extension. Pt standing reaching overhead for horseshoes working on increased hip/trunk extension, mod assist without AD. Pt left seated in w/c in room at end of session, needs in reach and chair alarm set.   Therapy  Documentation Precautions:  Precautions Precautions: Fall Precaution Comments: expressive aphasia Restrictions Weight Bearing Restrictions: No   Therapy/Group: Individual Therapy  Netta Corrigan, PT, DPT 07/13/2018, 7:46 AM

## 2018-07-13 NOTE — Progress Notes (Signed)
Approximately 1cm skin tear observed on patients right lower forearm.  Patient states that she bumped her arm during therapy today. Wound bed is dry and nonbleeding.  Pink foam bandage placed over wound.

## 2018-07-13 NOTE — Progress Notes (Signed)
Patient information reviewed and entered into eRehab system by Melisse Caetano, RN, CRRN, PPS Coordinator.  Information including medical coding, functional ability and quality indicators will be reviewed and updated through discharge.     Per nursing patient was given "Data Collection Information Summary for Patients in Inpatient Rehabilitation Facilities with attached "Privacy Act Statement-Health Care Records" upon admission.  

## 2018-07-14 ENCOUNTER — Inpatient Hospital Stay (HOSPITAL_COMMUNITY): Payer: PPO | Admitting: Speech Pathology

## 2018-07-14 ENCOUNTER — Inpatient Hospital Stay (HOSPITAL_COMMUNITY): Payer: PPO

## 2018-07-14 ENCOUNTER — Inpatient Hospital Stay (HOSPITAL_COMMUNITY): Payer: PPO | Admitting: Physical Therapy

## 2018-07-14 ENCOUNTER — Inpatient Hospital Stay (HOSPITAL_COMMUNITY): Payer: Self-pay | Admitting: Physical Therapy

## 2018-07-14 ENCOUNTER — Inpatient Hospital Stay (HOSPITAL_COMMUNITY): Payer: PPO | Admitting: Occupational Therapy

## 2018-07-14 ENCOUNTER — Inpatient Hospital Stay (HOSPITAL_COMMUNITY): Payer: Self-pay

## 2018-07-14 LAB — GLUCOSE, CAPILLARY
GLUCOSE-CAPILLARY: 94 mg/dL (ref 70–99)
GLUCOSE-CAPILLARY: 88 mg/dL (ref 70–99)
GLUCOSE-CAPILLARY: 86 mg/dL (ref 70–99)
Glucose-Capillary: 83 mg/dL (ref 70–99)

## 2018-07-14 NOTE — Progress Notes (Signed)
Occupational Therapy Session Note  Patient Details  Name: Kaitlyn Underwood MRN: [id number] Date of Birth: [birth year]  Today's Date: 07/14/2018 OT Individual Time: 7654-6503 OT Individual Time Calculation (min): 60 min    Short Term Goals: Week 1:  OT Short Term Goal 1 (Week 1): Pt's STGs are equal to LTGs set at supervision level.    Skilled Therapeutic Interventions/Progress Updates:      Pt seen for BADL retraining of toileting, bathing, and dressing with a focus on R side awareness, use of RUE, sit to stand, standing balance. See ADL documentation below for details.  Pt needed mod cues as she completely forgot to put R arm into sleeve, min cues with grooming to find items on sink.  Initially a heavy mod A to rise to stand from bed. By end of session min A to stand from w/c.  Pt with heavy breathing throughout session needing numerous short rest breaks.  Overall she demonstrated good motivation and responded to cuing well.  Pt resting in bed with bed alarm set and all needs met.  Therapy Documentation Precautions:  Precautions Precautions: Fall Precaution Comments: expressive aphasia Restrictions Weight Bearing Restrictions: No      Pain: Pain Assessment Pain Score: 0-No pain ADL: ADL Eating: Supervision/safety Where Assessed-Eating: Wheelchair Grooming: Supervision/safety Where Assessed-Grooming: Wheelchair Upper Body Bathing: Supervision/safety Where Assessed-Upper Body Bathing: Shower Lower Body Bathing: Contact guard Where Assessed-Lower Body Bathing: Shower Upper Body Dressing: Minimal assistance Where Assessed-Upper Body Dressing: Wheelchair Lower Body Dressing: Moderate assistance Where Assessed-Lower Body Dressing: Wheelchair Toileting: Minimal assistance Where Assessed-Toileting: Glass blower/designer: Moderate assistance Toilet Transfer Method: Stand pivot Toilet Transfer Equipment: Energy manager: Moderate assistance Financial planner Method: Radiographer, therapeutic: Grab bars, Transfer tub bench   Therapy/Group: Individual Therapy  Kaitlyn Underwood 07/14/2018, 12:06 PM

## 2018-07-14 NOTE — Progress Notes (Signed)
Physical Therapy Session Note  Patient Details  Name: Kaitlyn Underwood MRN: [id number] Date of Birth: [birth year]  Today's Date: 07/14/2018 PT Individual Time: 1415-1440 PT Individual Time Calculation (min): 25 min   Short Term Goals: Week 1:  PT Short Term Goal 1 (Week 1): Pt will perform stand pivot transfer with RW at [facility] level PT Short Term Goal 2 (Week 1): Pt will ambulate up to 150' with LRAD at min assist level PT Short Term Goal 3 (Week 1): Pt will ascend/descend 4, 6" stairs using 2 handrails at min assist level  Skilled Therapeutic Interventions/Progress Updates:   Pt in supine and agreeable to therapy, denies pain. Session focused on functional mobility, endurance, and tolerance to upright. Transferred to EOB w/ min assist and to w/c via squat pivot w/ mod assist. Pt needed max cues for technique and sequencing. Total assist w/c transport to/from therapy gym for time management. Ambulated 30' x3 reps w/ min assist-CGA, needed min-mod assist to transition from sitting to standing. Verbal cues for increased step length and foot clearance and to decrease knee flexion bilaterally in all phases of gait. Seated rest break in between 2/2 increased work of breathing that resolves w/ rest. Returned to room and ended session in w/c, all needs in reach, awaiting next therapy session.   Therapy Documentation Precautions:  Precautions Precautions: Fall Precaution Comments: expressive aphasia Restrictions Weight Bearing Restrictions: No Vital Signs: Therapy Vitals Temp: 98.3 F (36.8 C) Pulse Rate: (!) 43 Resp: 20 BP: (!) 151/95 Patient Position (if appropriate): Lying Oxygen Therapy SpO2: 99 % O2 Device: Room Air Pain: Pain Assessment Pain Scale: 0-10 Pain Score: 0-No pain  Therapy/Group: Individual Therapy  Aigner Horseman Clent Demark 07/14/2018, 2:45 PM

## 2018-07-14 NOTE — Plan of Care (Signed)
  Problem: Consults Goal: RH STROKE PATIENT EDUCATION Description See Patient Education module for education specifics  Outcome: Progressing Goal: Nutrition Consult-if indicated Outcome: Progressing Goal: Diabetes Guidelines if Diabetic/Glucose > 140 Description If diabetic or lab glucose is > 140 mg/dl - Initiate Diabetes/Hyperglycemia Guidelines & Document Interventions  Outcome: Progressing   Problem: RH BOWEL ELIMINATION Goal: RH STG MANAGE BOWEL WITH ASSISTANCE Description STG Manage Bowel with Mod I Assistance.  Outcome: Progressing Goal: RH STG MANAGE BOWEL W/MEDICATION W/ASSISTANCE Description STG Manage Bowel with Medication with Mod I Assistance.  Outcome: Progressing   Problem: RH BLADDER ELIMINATION Goal: RH STG MANAGE BLADDER WITH ASSISTANCE Description STG Manage Bladder With Mod I Assistance  Outcome: Progressing Goal: RH STG MANAGE BLADDER WITH EQUIPMENT WITH ASSISTANCE Description STG Manage Bladder With Equipment With Mod I Assistance  Outcome: Progressing   Problem: RH SAFETY Goal: RH STG ADHERE TO SAFETY PRECAUTIONS W/ASSISTANCE/DEVICE Description STG Adhere to Safety Precautions With Mod I Assistance/Device.  Outcome: Progressing Goal: RH STG DECREASED RISK OF FALL WITH ASSISTANCE Description STG Decreased Risk of Fall With Mod I Assistance.  Outcome: Progressing   Problem: RH KNOWLEDGE DEFICIT Goal: RH STG INCREASE KNOWLEDGE OF DIABETES Description With Min Assistance  Outcome: Progressing Goal: RH STG INCREASE KNOWLEDGE OF HYPERTENSION Description With Min Assistance   Outcome: Progressing Goal: RH STG INCREASE KNOWLEDGE OF DYSPHAGIA/FLUID INTAKE Description With Mod I Assistance   Outcome: Progressing Goal: RH STG INCREASE KNOWLEGDE OF HYPERLIPIDEMIA Description With Min Assistance   Outcome: Progressing Goal: RH STG INCREASE KNOWLEDGE OF STROKE PROPHYLAXIS Description With Min Assistance   Outcome: Progressing

## 2018-07-14 NOTE — Progress Notes (Signed)
Inpatient [facility] Individual Statement of Services  Patient Name:  Kaitlyn Underwood  Date:  07/14/2018  Welcome to the [facility].  Our goal is to provide you with an individualized program based on your diagnosis and situation, designed to meet your specific needs.  With this comprehensive rehabilitation program, you will be expected to participate in at least 3 hours of rehabilitation therapies Monday-Friday, with modified therapy programming on the weekends.  Your rehabilitation program will include the following services:  Physical Therapy (PT), Occupational Therapy (OT), Speech Therapy (ST), 24 hour per day rehabilitation nursing, Neuropsychology, Case Management (Social Worker), Rehabilitation Medicine, Nutrition Services and Pharmacy Services  Weekly team conferences will be held on Wednesdays to discuss your progress.  Your Social Worker will talk with you frequently to get your input and to update you on team discussions.  Team conferences with you and your family in attendance may also be held.  Expected length of stay: about 2 weeks  Overall anticipated outcome:  Supervision  Depending on your progress and recovery, your program may change. Your Social Worker will coordinate services and will keep you informed of any changes. Your Social Worker's name and contact numbers are listed  below.  The following services may also be recommended but are not provided by the [facility] will be made to provide these services after discharge if needed.  Arrangements include referral to agencies that provide these services.  Your insurance has been verified to be:  [employer]  Your primary doctor is:  Dr. Nelda Bucks  Pertinent information will be shared with your doctor and your insurance company.  Social Worker:   Alfonse Alpers, LCSW  [phone] or [phone]  Information discussed with and copy given to patient by: Trey Sailors, 07/14/2018, 12:37 PM

## 2018-07-14 NOTE — Progress Notes (Signed)
[facility] PHYSICAL MEDICINE & REHABILITATION PROGRESS NOTE   Subjective/Complaints:  No SOB but wheezing this am, hx COPD did not receive nebs this am per pt, IVF last noc  ROS  Neg for CP, SOB, N/V/D, or body aches or pains  Objective:   No results found. Recent Labs    07/13/18 0807  WBC 10.2  HGB 11.6*  HCT 36.3  PLT 412*   Recent Labs    07/13/18 0807  NA 144  K 3.8  CL 112*  CO2 26  GLUCOSE 105*  BUN 12  CREATININE 0.93  CALCIUM 9.4    Intake/Output Summary (Last 24 hours) at 07/14/2018 0741 Last data filed at 07/14/2018 0723 Gross per 24 hour  Intake 1902.92 ml  Output -  Net 1902.92 ml     Physical Exam: Vital Signs Blood pressure (!) 151/91, pulse (!) 55, temperature 98.5 F (36.9 C), resp. rate 20, height 5\' 2"  (1.575 m), weight 90.4 kg, SpO2 97 %.   General: No acute distress Mood and affect are appropriate Heart: Regular rate and rhythm no rubs murmurs or extra sounds Lungs: Clear to auscultation, breathing unlabored, +wheezes bilaterally Abdomen: Positive bowel sounds, soft nontender to palpation, nondistended Extremities: No clubbing, cyanosis, or edema Skin: No evidence of breakdown, no evidence of rash Neurologic: Cranial nerves II through XII intact, motor strength is 5/5 in Left and 4/5 on Right  deltoid, bicep, tricep, grip, hip flexor, knee extensors, ankle dorsiflexor and plantar flexor Sensory exam normal sensation to light touch and proprioception in bilateral upper and lower extremities Cerebellar exam normal finger to nose to finger as well as heel to shin in bilateral upper and lower extremities Musculoskeletal: Full range of motion in all 4 extremities. No joint swelling   Assessment/Plan: 1. Functional deficits secondary to Left MCA infarct  which require 3+ hours per day of interdisciplinary therapy in a comprehensive inpatient rehab setting.  Physiatrist is providing close team supervision and 24 hour management of active  medical problems listed below.  Physiatrist and rehab team continue to assess barriers to discharge/monitor patient progress toward functional and medical goals  Care Tool:  Bathing    Body parts bathed by patient: Right arm, Left arm, Chest, Abdomen, Right upper leg, Left upper leg, Right lower leg, Left lower leg, Face     Body parts n/a: Buttocks, Front perineal area(did not attempt secondary to female therapist)   Bathing assist Assist Level: Total Assistance - Patient < 25%     Upper Body Dressing/Undressing Upper body dressing   What is the patient wearing?: Hospital gown only    Upper body assist Assist Level: Moderate Assistance - Patient 50 - 74%    Lower Body Dressing/Undressing Lower body dressing      What is the patient wearing?: Incontinence brief     Lower body assist Assist for lower body dressing: Maximal Assistance - Patient 25 - 49%     Toileting Toileting    Toileting assist Assist for toileting: Moderate Assistance - Patient 50 - 74%     Transfers Chair/bed transfer  Transfers assist  Chair/bed transfer activity did not occur: Safety/medical concerns  Chair/bed transfer assist level: Moderate Assistance - Patient 50 - 74%     Locomotion Ambulation   Ambulation assist      Assist level: Minimal Assistance - Patient > 75% Assistive device: Walker-rolling Max distance: 15 ft   Walk 10 feet activity   Assist     Assist level: Minimal Assistance -  Patient > 75% Assistive device: Walker-rolling   Walk 50 feet activity   Assist    Assist level: Minimal Assistance - Patient > 75% Assistive device: Walker-rolling    Walk 150 feet activity   Assist Walk 150 feet activity did not occur: Safety/medical concerns         Walk 10 feet on uneven surface  activity   Assist Walk 10 feet on uneven surfaces activity did not occur: Safety/medical concerns         Wheelchair     Assist   Type of Wheelchair: Manual     Wheelchair assist level: Contact Guard/Touching assist Max wheelchair distance: 50    Wheelchair 50 feet with 2 turns activity    Assist        Assist Level: Contact Guard/Touching assist   Wheelchair 150 feet activity     Assist Wheelchair 150 feet activity did not occur: Safety/medical concerns        Medical Problem List and Plan: 1.Right side weakness with facial droop and aphasiasecondary to left MCA infarction -CIR PT, OT SLP 2. DVT Prophylaxis/Anticoagulation: Eliquis 3. Pain Management:Tylenol as needed 4. Mood:Zoloft 25 mg daily 5. Neuropsych: This patientiscapable of making decisions on herown behalf. 6. Skin/Wound Care:Routine skin checks 7. Fluids/Electrolytes/Nutrition:Routine in and out's with follow-up chemistries 8. Dysphagia. Dysphagia #1 pudding thick liquids. Monitor hydration. Follow-up speech therapy -BMET on admit 9. Hypertension. Toprol 50 mg daily Vitals:   07/13/18 2052 07/14/18 0444  BP:  (!) 151/91  Pulse:  (!) 55  Resp:  20  Temp:  98.5 F (36.9 C)  SpO2: 98% 97%  mildly elevated systolic permissive x 1 wk 10. Atrial fibrillation. Lanoxin 0.125 mg daily. Cardiac rate controlled 11.COPD.pt states she has not received inhaler this am - on Singulair 10mg  qhs, Pulmicort .5 BID neb, prn albuterol  12.Chronic systolic congestive heart failure.Monitor for any signs of fluid overload, no peripheral edema or rales  -daily weights ordered, check CXR given wheezes and IVF at noc, ? pulm edema 13. Hyperlipidemia. Pravachol 14. Pre-diabetes. Hemoglobin A1c 5.6. SSI    LOS: 4 days A FACE TO FACE EVALUATION WAS PERFORMED  Charlett Blake 07/14/2018, 7:41 AM

## 2018-07-14 NOTE — Progress Notes (Addendum)
Social Work Assessment and Plan  Patient Details  Name: Kaitlyn Underwood MRN: [id number] Date of Birth: 1940/12/05  Today's Date: 07/13/2018  Problem List:  Patient Active Problem List   Diagnosis Date Noted  . Left middle cerebral artery stroke ([place]) 07/10/2018  . Aphasia, post-stroke   . Acute ischemic left MCA stroke ([place]) 07/06/2018  . Prediabetes 07/06/2018  . Chronic systolic heart failure ([place]) 07/06/2018  . Endotracheally intubated 07/06/2018  . Hyperkalemia 07/06/2018  . Hypercalcemia 07/06/2018  . CKD (chronic kidney disease), stage III ([place]) 07/06/2018  . COPD (chronic obstructive pulmonary disease) ([place]) 07/06/2018  . Middle cerebral artery embolism, left 07/06/2018  . Bilateral carotid bruits 04/03/2017  . Deformity of toe of left foot 12/16/2016  . Acute idiopathic gout involving toe of left foot 11/18/2016  . Contracture of right ankle 03/25/2016  . Foot pain, bilateral 03/25/2016  . Paresthesia of left foot 03/25/2016  . Plantar fasciitis of right foot 03/25/2016  . Chronic atrial fibrillation 05/16/2015  . Coronary artery disease involving native coronary artery of native heart without angina pectoris 05/16/2015  . Diabetes mellitus due to underlying condition with unspecified complications ([place]) 96/28/3662  . Dyslipidemia 05/16/2015  . Essential hypertension 05/16/2015  . Carotid stenosis, left 05/16/2015  . Obesities, morbid ([place]) 05/16/2015   Past Medical History:  Past Medical History:  Diagnosis Date  . Anxiety   . Atrial fibrillation ([place])   . Chronic systolic heart failure ([place])   . COPD (chronic obstructive pulmonary disease) ([place])   . Coronary artery disease   . Hypertension   . Hypothyroidism   . Left renal mass    unconfirmed documentation of "kidney cancer", possibly cyst  . Pre-diabetes    Past Surgical History:  Past Surgical History:  Procedure Laterality Date  . IR CT HEAD LTD  07/06/2018  . IR PERCUTANEOUS ART THROMBECTOMY/INFUSION  INTRACRANIAL INC DIAG ANGIO  07/06/2018  . LEFT HEART CATH AND CORONARY ANGIOGRAPHY N/A 04/29/2017   Procedure: LEFT HEART CATH AND CORONARY ANGIOGRAPHY;  Surgeon: Belva Crome, MD;  Location: [facility];  Service: Cardiovascular;  Laterality: N/A;  . RADIOLOGY WITH ANESTHESIA N/A 07/05/2018   Procedure: RADIOLOGY WITH ANESTHESIA;  Surgeon: Luanne Bras, MD;  Location: [facility];  Service: Radiology;  Laterality: N/A;  . ULTRASOUND GUIDANCE FOR VASCULAR ACCESS  04/29/2017   Procedure: Ultrasound Guidance For Vascular Access;  Surgeon: Belva Crome, MD;  Location: [facility];  Service: Cardiovascular;;   Social History:  reports that she quit smoking about 28 years ago. Her smoking use included cigarettes. She has a 20.00 pack-year smoking history. She has never used smokeless tobacco. She reports that she does not drink alcohol or use drugs.  Family / Support Systems Marital Status: Married How Long?: 42 years? Patient Roles: Spouse, Parent, Other (Comment)(grandmother) Spouse/Significant Other: Kaitlyn Underwood - husband - [phone] Children: Kaitlyn Underwood - dtr - [phone]; 4 other dtrs Other Supports: granddtr Anticipated Caregiver: dtrs and granddtr Ability/Limitations of Caregiver: Dtr Kaitlyn Underwood lives next door and not working.  Dtr Kaitlyn Underwood in [place].  Dtr Kaitlyn Underwood is first call and coordinates care.  Husband defers to his daughters.  Kaitlyn Underwood daughter is in [place]. Caregiver Availability: 24/7 Family Dynamics: close, supportive family  Social History Preferred language: English Religion: Non-Denominational Read: Yes Write: Yes Employment Status: Retired Date Retired/Disabled/Unemployed: 2011 Age Retired: 88 Legal History/Current Legal Issues: none reported Guardian/Conservator: N/A - MD had determined that pt is capable of making her own decisions.  Abuse/Neglect Abuse/Neglect Assessment Can Be Completed: Yes Physical Abuse: Denies Verbal Abuse:  Denies Sexual Abuse: Denies Exploitation of patient/patient's resources: Denies Self-Neglect: Denies  Emotional Status Pt's affect, behavior and adjustment status: Pt smiles and looks at Kaitlyn Underwood when she is talking, even as she has some difficulty finding her words.  She reports being in good spirits and is motivated to work hard. Recent Psychosocial Issues: none reported Psychiatric History: none reported Substance Abuse History: none reported  Patient / Family Perceptions, Expectations & Goals Pt/Family understanding of illness & functional limitations: Pt/dtr Kaitlyn Underwood) report a good understanding of pt's condition and have already seen progress.  They feel their questions have been answered thus far. Premorbid pt/family roles/activities: Pt likes to spend time with her dog (Kaitlyn Underwood), go shopping, go out to eat, and watch TV. Anticipated changes in roles/activities/participation: Pt would like to resume above activities as she is able. Pt/family expectations/goals: Pt would like to be able to swallow correctly and have her speech improve.  Community Resources Express Scripts: None Premorbid Home Care/DME Agencies: None Transportation available at discharge: family Resource referrals recommended: Neuropsychology, Support group (specify)(stroke support group)  Discharge Planning Living Arrangements: Spouse/significant other Support Systems: Spouse/significant other Type of Residence: Private residence Administrator, sports: Multimedia programmer (specify)([employer]) Museum/gallery curator Resources: Fish farm manager, Family Support Financial Screen Referred: No Money Management: Spouse, Patient Does the patient have any problems obtaining your medications?: No Home Management: Pt's husband and children mainly manage home, as pt needed some help prior to her hospitalization. Patient/Family Preliminary Plans: Pt plans to return to her home where her family is working on a schedule to provide pt with  24/7 supervision/care. Social Work Anticipated Follow Up Needs: HH/OP, Support Group Expected length of stay: about 2 weeks  Clinical Impression CSW met with pt 07-13-18 to introduce self and role of CSW, as well as to complete assessment.  Later, CSW called pt's dtr, Kaitlyn Underwood, to do the same with her and ended up speaking to pt's dtr, Kaitlyn Underwood, as Kaitlyn Underwood was on the phone.  Family plans to schedule someone to be with pt to provide 24/7 supervision.  Kaitlyn Underwood reports already seeing progress in her mother and is pleased.  They do not have any concern, needs, questions at this time and expressed understanding of CSW's role.  CSW will continue to follow and assist as needed.  Kaitlyn Underwood, Kaitlyn Underwood 07/14/2018, 1:41 PM

## 2018-07-14 NOTE — Progress Notes (Signed)
Physical Therapy Session Note  Patient Details  Name: Kaitlyn Underwood MRN: [id number] Date of Birth: May 11, 1941  Today's Date: 07/14/2018 PT Individual Time: 8381-8403 PT Individual Time Calculation (min): 40 min   Short Term Goals: Week 1:  PT Short Term Goal 1 (Week 1): Pt will perform stand pivot transfer with RW at [facility] level PT Short Term Goal 2 (Week 1): Pt will ambulate up to 150' with LRAD at min assist level PT Short Term Goal 3 (Week 1): Pt will ascend/descend 4, 6" stairs using 2 handrails at min assist level  Skilled Therapeutic Interventions/Progress Updates:    Pt seated in w/c upon PT arrival, agreeable to therapy tx and denies pain. Pt transported to the gym. Pt ambulated 3 x 60 ft this session with RW and min assist, verbal cues for upright posture and decreased UE reliance. Pt worked on standing balance with RW while tossing horseshoes, x 2 trials with emphasis on reaching overhead to grab horseshoes working on trunk extension. Pt transported to rehab apartment. Pt worked on w/c<>bed transfer with mod assist and bed mobility with min assist, verbal cues for techniques. Pt supine on bed performed x 10 bridges for LE strengthening. Pt transported back to room, and let seated in w/c with needs in reach and chair alarm set.   Therapy Documentation Precautions:  Precautions Precautions: Fall Precaution Comments: expressive aphasia Restrictions Weight Bearing Restrictions: No   Therapy/Group: Individual Therapy  Netta Corrigan, PT, DPT 07/14/2018, 7:52 AM

## 2018-07-14 NOTE — Plan of Care (Signed)
  Problem: Consults Goal: [facility] STROKE PATIENT EDUCATION Description See Patient Education module for education specifics  07/14/2018 1624 by Adria Devon, LPN Outcome: Progressing 07/14/2018 1623 by Ellison Carwin A, LPN Outcome: Progressing Goal: Nutrition Consult-if indicated 07/14/2018 1624 by Adria Devon, LPN Outcome: Progressing 07/14/2018 1623 by Adria Devon, LPN Outcome: Progressing Goal: Diabetes Guidelines if Diabetic/Glucose > 140 Description If diabetic or lab glucose is > 140 mg/dl - Initiate Diabetes/Hyperglycemia Guidelines & Document Interventions  07/14/2018 1624 by Adria Devon, LPN Outcome: Progressing 07/14/2018 1623 by Adria Devon, LPN Outcome: Progressing   Problem: RH BOWEL ELIMINATION Goal: RH STG MANAGE BOWEL WITH ASSISTANCE Description STG Manage Bowel with Mod I Assistance.  07/14/2018 1624 by Adria Devon, LPN Outcome: Progressing 07/14/2018 1623 by Adria Devon, LPN Outcome: Progressing Goal: RH STG MANAGE BOWEL W/MEDICATION W/ASSISTANCE Description STG Manage Bowel with Medication with Mod I Assistance.  07/14/2018 1624 by Adria Devon, LPN Outcome: Progressing 07/14/2018 1623 by Adria Devon, LPN Outcome: Progressing   Problem: RH BLADDER ELIMINATION Goal: RH STG MANAGE BLADDER WITH ASSISTANCE Description STG Manage Bladder With Mod I Assistance  07/14/2018 1624 by Adria Devon, LPN Outcome: Progressing 07/14/2018 1623 by Adria Devon, LPN Outcome: Progressing Goal: RH STG MANAGE BLADDER WITH EQUIPMENT WITH ASSISTANCE Description STG Manage Bladder With Equipment With Mod I Assistance  07/14/2018 1624 by Adria Devon, LPN Outcome: Progressing 07/14/2018 1623 by Adria Devon, LPN Outcome: Progressing   Problem: RH SAFETY Goal: RH STG ADHERE TO SAFETY PRECAUTIONS W/ASSISTANCE/DEVICE Description STG Adhere to Safety Precautions With Mod I  Assistance/Device.  07/14/2018 1624 by Adria Devon, LPN Outcome: Progressing 07/14/2018 1623 by Adria Devon, LPN Outcome: Progressing Goal: RH STG DECREASED RISK OF FALL WITH ASSISTANCE Description STG Decreased Risk of Fall With Mod I Assistance.  07/14/2018 1624 by Adria Devon, LPN Outcome: Progressing 07/14/2018 1623 by Adria Devon, LPN Outcome: Progressing   Problem: RH KNOWLEDGE DEFICIT Goal: RH STG INCREASE KNOWLEDGE OF DIABETES Description With Min Assistance  07/14/2018 1624 by Adria Devon, LPN Outcome: Progressing 07/14/2018 1623 by Ellison Carwin A, LPN Outcome: Progressing Goal: RH STG INCREASE KNOWLEDGE OF HYPERTENSION Description With Min Assistance   07/14/2018 1624 by Adria Devon, LPN Outcome: Progressing 07/14/2018 1623 by Adria Devon, LPN Outcome: Progressing Goal: RH STG INCREASE KNOWLEDGE OF DYSPHAGIA/FLUID INTAKE Description With Mod I Assistance   07/14/2018 1624 by Adria Devon, LPN Outcome: Progressing 07/14/2018 1623 by Adria Devon, LPN Outcome: Progressing Goal: RH STG INCREASE KNOWLEGDE OF HYPERLIPIDEMIA Description With Min Assistance   07/14/2018 1624 by Adria Devon, LPN Outcome: Progressing 07/14/2018 1623 by Adria Devon, LPN Outcome: Progressing Goal: RH STG INCREASE KNOWLEDGE OF STROKE PROPHYLAXIS Description With Min Assistance   07/14/2018 1624 by Adria Devon, LPN Outcome: Progressing 07/14/2018 1623 by Adria Devon, LPN Outcome: Progressing

## 2018-07-14 NOTE — Progress Notes (Signed)
Speech Language Pathology Daily Session Note  Patient Details  Name: Kaitlyn Underwood MRN: [id number] Date of Birth: 1941-05-29  Today's Date: 07/14/2018 SLP Individual Time: 1100-1155 SLP Individual Time Calculation (min): 55 min  Short Term Goals: Week 1: SLP Short Term Goal 1 (Week 1): Pt will name basic, familiar objects for >75% accuracy with min assist verbal cues.   SLP Short Term Goal 2 (Week 1): Pt will recognize and correct verbal errors with mod assist verbal cues.   SLP Short Term Goal 3 (Week 1): Pt will consume therapeutic trials of honey thick liquids with min cues for use of swallowing precautions and minimal overt s/s of aspiration over 2 consecutive sessions prior to repeat MBS.   SLP Short Term Goal 4 (Week 1): Pt will consume therapeutic trials of dys 2 textures with min cues for use of swallowing precuations over 2 consecutive sessions prior to advancement.   SLP Short Term Goal 5 (Week 1): Pt will utilize external aids to recall basic, daily information with mod assist multimodal cues.    Skilled Therapeutic Interventions: Skilled treatment session focused on dysphagia and communication goals. SLP facilitated session by providing thorough oral care of patient's oral care and dentures. Patient consumed trials of ice chips and thin liquids via tsp without overt s/s of aspiration and what appeared to be an intermittent timely swallow initiation. Patient with h/o of silent aspiration, therefore, recommend repeat MBS tomorrow to assess swallowing function and possible diet advancement. Patient consumed lunch meal of Dys. 1 textures with pudding thick liquids with minimal throat clearing, suspect due to large bites/sips which required Min A verbal cues to self-monitor and correct. Patient asking appropriate questions in regards to swallowing prognosis and cause of stroke with minimal verbal errors that patient self-corrected with extra time and Min A verbal cues. Patient left upright in  bed with alarm on and all needs within reach. Continue with current plan of care.      Pain Pain Assessment Pain Scale: 0-10 Pain Score: 0-No pain  Therapy/Group: Individual Therapy  Kunal Levario 07/14/2018, 1:17 PM

## 2018-07-15 ENCOUNTER — Encounter (HOSPITAL_COMMUNITY): Payer: PPO | Admitting: Speech Pathology

## 2018-07-15 ENCOUNTER — Inpatient Hospital Stay (HOSPITAL_COMMUNITY): Payer: Self-pay

## 2018-07-15 ENCOUNTER — Inpatient Hospital Stay (HOSPITAL_COMMUNITY): Payer: PPO | Admitting: Speech Pathology

## 2018-07-15 ENCOUNTER — Inpatient Hospital Stay (HOSPITAL_COMMUNITY): Payer: PPO | Admitting: *Deleted

## 2018-07-15 ENCOUNTER — Inpatient Hospital Stay (HOSPITAL_COMMUNITY): Payer: PPO | Admitting: Occupational Therapy

## 2018-07-15 ENCOUNTER — Inpatient Hospital Stay (HOSPITAL_COMMUNITY): Payer: PPO

## 2018-07-15 DIAGNOSIS — J42 Unspecified chronic bronchitis: Secondary | ICD-10-CM

## 2018-07-15 LAB — GLUCOSE, CAPILLARY
GLUCOSE-CAPILLARY: 102 mg/dL — AB (ref 70–99)
GLUCOSE-CAPILLARY: 97 mg/dL (ref 70–99)
Glucose-Capillary: 82 mg/dL (ref 70–99)
Glucose-Capillary: 136 mg/dL — ABNORMAL HIGH (ref 70–99)

## 2018-07-15 LAB — DIGOXIN LEVEL: DIGOXIN LVL: 0.8 ng/mL (ref 0.8–2.0)

## 2018-07-15 MED ORDER — METOPROLOL TARTRATE 12.5 MG HALF TABLET
12.5000 mg | ORAL_TABLET | Freq: Two times a day (BID) | ORAL | Status: DC
  Administered 2018-07-15 – 2018-07-25 (×20): 12.5 mg via ORAL
  Filled 2018-07-15 (×20): qty 1

## 2018-07-15 NOTE — Patient Care Conference (Signed)
Inpatient RehabilitationTeam Conference and Plan of Care Update Date: 07/15/2018   Time: 11:15 AM    Patient Name: Kaitlyn Underwood      Medical Record Number: [id number]  Date of Birth: 07-22-1941 Sex: Female         Room/Bed: 4W19C/4W19C-01 Payor Info: Payor: [employer] / Plan: [employer] / Product Type: *No Product type* /    Admitting Diagnosis: L CVA  Admit Date/Time:  07/10/2018  4:06 PM Admission Comments: No comment available   Primary Diagnosis:  Left middle cerebral artery stroke [facility]) Principal Problem: Left middle cerebral artery stroke [facility])  Patient Active Problem List   Diagnosis Date Noted  . Left middle cerebral artery stroke ([place]) 07/10/2018  . Aphasia, post-stroke   . Acute ischemic left MCA stroke ([place]) 07/06/2018  . Prediabetes 07/06/2018  . Chronic systolic heart failure ([place]) 07/06/2018  . Endotracheally intubated 07/06/2018  . Hyperkalemia 07/06/2018  . Hypercalcemia 07/06/2018  . CKD (chronic kidney disease), stage III ([place]) 07/06/2018  . COPD (chronic obstructive pulmonary disease) ([place]) 07/06/2018  . Middle cerebral artery embolism, left 07/06/2018  . Bilateral carotid bruits 04/03/2017  . Deformity of toe of left foot 12/16/2016  . Acute idiopathic gout involving toe of left foot 11/18/2016  . Contracture of right ankle 03/25/2016  . Foot pain, bilateral 03/25/2016  . Paresthesia of left foot 03/25/2016  . Plantar fasciitis of right foot 03/25/2016  . Chronic atrial fibrillation 05/16/2015  . Coronary artery disease involving native coronary artery of native heart without angina pectoris 05/16/2015  . Diabetes mellitus due to underlying condition with unspecified complications ([place]) 65/10/5463  . Dyslipidemia 05/16/2015  . Essential hypertension 05/16/2015  . Carotid stenosis, left 05/16/2015  . Obesities, morbid ([place]) 05/16/2015    Expected Discharge Date: Expected Discharge Date: 07/25/18  Team Members Present: Physician  leading conference: Dr. Alysia Penna Social Worker Present: Alfonse Alpers, LCSW Nurse Present: Heather Roberts, RN PT Present: Michaelene Song, PT OT Present: Simonne Come, OT;Roanna Epley, [place], OT SLP Present: Weston Anna, SLP PPS Coordinator present : Daiva Nakayama, RN, CRRN(Melissa Gertie Fey, SLP)     Current Status/Progress Goal Weekly Team Focus  Medical   problem with adequate fluid intake, IV access is limited as well  maintain med stability, adequate fluid intake  exp muscle strenghtening to improve swallow , lift restriction and improve fluid intake   Bowel/Bladder   Incontinent x2 LBM 11/25  increased bowel and bladder continence  offer assistance with toileting q2hrs and assess B&B needs qshift and PRN   Swallow/Nutrition/ Hydration   Upgraded to Dys. 2 textures with NTL/water protocol, Supervision  Supervision  Tolerance of current diet   ADL's   min to mod A self care, mod A transfers and sit to stand - increased RLE weakness, R inattention  S overall  ADL training, R side awareness, RUE NMR, balance, functional mobility, pt/ family education   Mobility   min assist bed mobility and sit<>stand, mod assist transfers, min-mod assist gait with RW x 60 ft  supervision   endurance, posture, LE strength, activity tolerance, balance, gait   Communication   Mod A   Min A   self-monitor and correct speech errors, verbal expression    Safety/Cognition/ Behavioral Observations  Min A   Supervision  recall with use of strategies    Pain   no c/o pain  Pt to remain free of pain  assess pain qshift and PRN   Skin   skin tear on  right forearm  Pt to remain free of skin infection and no further breakdown  [place] q shift and PRN    Rehab Goals Patient on target to meet rehab goals: Yes Rehab Goals Revised: none *See Care Plan and progress notes for long and short-term goals.     Barriers to Discharge  Current Status/Progress Possible Resolutions Date Resolved    Physician    Medical stability;Nutrition means     slow progress toward goal of increased intake  see above      Nursing                  PT  Inaccessible home environment;Decreased caregiver support;Home environment access/layout;Lack of/limited family support                 OT                  SLP                SW                Discharge Planning/Teaching Needs:  Pt plans to return to her home with multiple family members to provide 24/7 supervision/min A.  Family education to be provided closer to d/c.   Team Discussion:  Pt's blood pressure is low and Dr. Letta Pate is adjusting medications.  IV access is difficult for pt so team was considering PICC line vs. midline for IV fluids at night, but pt's MBS allowed her to be advanced to nectar thick liquids, so team will await her hopeful advance to thin liquids soon and avoid placing new line, in light of being able to have nectar thick, as well. Pt is incontinent of bowel and bladder, but nursing will start timed toileting in an effort to help pt regain continence.  Speech has been focusing on swallowing and with that improving, focus will shift more toward pt's language.  Pt is heavy mod A when she begins working with PT, but improves to min A once up and going.  She relies on her RW a lot.  Pt is contact guard for showering, but needs min/mod A for dressing.  Revisions to Treatment Plan:  none    Continued Need for Acute Rehabilitation Level of Care: The patient requires daily medical management by a physician with specialized training in physical medicine and rehabilitation for the following conditions: Daily direction of a multidisciplinary physical rehabilitation program to ensure safe treatment while eliciting the highest outcome that is of practical value to the patient.: Yes Daily medical management of patient stability for increased activity during participation in an intensive rehabilitation regime.: Yes Daily analysis of  laboratory values and/or radiology reports with any subsequent need for medication adjustment of medical intervention for : Neurological problems;Nutritional problems   I attest that I was present, lead the team conference, and concur with the assessment and plan of the team.   Brailee Riede, Silvestre Mesi 07/15/2018, 11:24 AM

## 2018-07-15 NOTE — Progress Notes (Signed)
[place] PHYSICAL MEDICINE & REHABILITATION PROGRESS NOTE   Subjective/Complaints:  Discussed IV access with nsg and IV RN.  Needs IV access for fluids, no IV meds,  Discussed with SLP- swallow eval today /MBS.   Discussed CXR result with pt.  Wheezing improved today, related to COPD, needing to use prn nebs  ROS  Neg for CP, SOB, N/V/D, or body aches or pains  Objective:   Dg Chest 2 View  Result Date: 07/14/2018 CLINICAL DATA:  Wheezing EXAM: CHEST - 2 VIEW COMPARISON:  07/08/2018 FINDINGS: Cardiomegaly. No confluent opacities, effusions or edema. No acute bony abnormality. IMPRESSION: Cardiomegaly.  No active disease. Electronically Signed   By: Rolm Baptise M.D.   On: 07/14/2018 10:42   Recent Labs    07/13/18 0807  WBC 10.2  HGB 11.6*  HCT 36.3  PLT 412*   Recent Labs    07/13/18 0807  NA 144  K 3.8  CL 112*  CO2 26  GLUCOSE 105*  BUN 12  CREATININE 0.93  CALCIUM 9.4    Intake/Output Summary (Last 24 hours) at 07/15/2018 0841 Last data filed at 07/14/2018 1730 Gross per 24 hour  Intake 140 ml  Output -  Net 140 ml     Physical Exam: Vital Signs Blood pressure (!) 136/50, pulse 60, temperature 98.4 F (36.9 C), resp. rate 16, height '5\' 2"'$  (1.575 m), weight 90.4 kg, SpO2 97 %.   General: No acute distress Mood and affect are appropriate Heart: Regular rate and rhythm no rubs murmurs or extra sounds Lungs: Clear to auscultation, breathing unlabored, +wheezes bilaterally Abdomen: Positive bowel sounds, soft nontender to palpation, nondistended Extremities: No clubbing, cyanosis, or edema Skin: No evidence of breakdown, no evidence of rash Neurologic: Cranial nerves II through XII intact, motor strength is 5/5 in Left and 4/5 on Right  deltoid, bicep, tricep, grip, hip flexor, knee extensors, ankle dorsiflexor and plantar flexor Sensory exam normal sensation to light touch and proprioception in bilateral upper and lower extremities Cerebellar exam  normal finger to nose to finger as well as heel to shin in bilateral upper and lower extremities Musculoskeletal: Full range of motion in all 4 extremities. No joint swelling   Assessment/Plan: 1. Functional deficits secondary to Left MCA infarct  which require 3+ hours per day of interdisciplinary therapy in a comprehensive inpatient rehab setting.  Physiatrist is providing close team supervision and 24 hour management of active medical problems listed below.  Physiatrist and rehab team continue to assess barriers to discharge/monitor patient progress toward functional and medical goals  Care Tool:  Bathing    Body parts bathed by patient: Right arm, Left arm, Chest, Abdomen, Right upper leg, Left upper leg, Right lower leg, Left lower leg, Face, Front perineal area, Buttocks     Body parts n/a: Buttocks, Front perineal area(did not attempt secondary to female therapist)   Bathing assist Assist Level: Contact Guard/Touching assist     Upper Body Dressing/Undressing Upper body dressing   What is the patient wearing?: Bra, Pull over shirt    Upper body assist Assist Level: Minimal Assistance - Patient > 75%    Lower Body Dressing/Undressing Lower body dressing      What is the patient wearing?: Incontinence brief, Pants     Lower body assist Assist for lower body dressing: Moderate Assistance - Patient 50 - 74%     Toileting Toileting    Toileting assist Assist for toileting: Contact Guard/Touching assist     Transfers Chair/bed  transfer  Transfers assist  Chair/bed transfer activity did not occur: Safety/medical concerns  Chair/bed transfer assist level: Moderate Assistance - Patient 50 - 74%     Locomotion Ambulation   Ambulation assist      Assist level: Minimal Assistance - Patient > 75% Assistive device: Walker-rolling Max distance: 60 ft   Walk 10 feet activity   Assist     Assist level: Minimal Assistance - Patient > 75% Assistive device:  Walker-rolling   Walk 50 feet activity   Assist    Assist level: Minimal Assistance - Patient > 75% Assistive device: Walker-rolling    Walk 150 feet activity   Assist Walk 150 feet activity did not occur: Safety/medical concerns         Walk 10 feet on uneven surface  activity   Assist Walk 10 feet on uneven surfaces activity did not occur: Safety/medical concerns         Wheelchair     Assist   Type of Wheelchair: Manual    Wheelchair assist level: Contact Guard/Touching assist Max wheelchair distance: 50    Wheelchair 50 feet with 2 turns activity    Assist        Assist Level: Contact Guard/Touching assist   Wheelchair 150 feet activity     Assist Wheelchair 150 feet activity did not occur: Safety/medical concerns        Medical Problem List and Plan: 1.Right side weakness with facial droop and aphasiasecondary to left MCA infarction Team conference today please see physician documentation under team conference tab, met with team face-to-face to discuss problems,progress, and goals. Formulized individual treatment plan based on medical history, underlying problem and comorbidities. 2. DVT Prophylaxis/Anticoagulation: Eliquis 3. Pain Management:Tylenol as needed 4. Mood:Zoloft 25 mg daily 5. Neuropsych: This patientiscapable of making decisions on herown behalf. 6. Skin/Wound Care:Routine skin checks 7. Fluids/Electrolytes/Nutrition:Routine in and out's with follow-up chemistries 8. Dysphagia. Dysphagia #1 pudding thick liquids. Monitor hydration. Follow-up speech therapy -BMET on admit 9. Hypertension. Toprol '25mg'$ , HR running low will reduce Vitals:   07/15/18 0733 07/15/18 0829  BP:    Pulse: 65 60  Resp:  16  Temp:    SpO2:  97%  136/50 reduce toprol and monitor HR and BP 10. Atrial fibrillation. Lanoxin 0.125 mg daily. Cardiac rate controlled- check dig level 11.COPD.pt states she has  not received inhaler this am - on Singulair '10mg'$  qhs, Pulmicort .5 BID neb, prn albuterol  12.Chronic systolic congestive heart failure.Monitor for any signs of fluid overload, no peripheral edema or rales  -daily weights ordered, check CXR given wheezes and IVF at noc, CXR shows no pulm edema, also has no LE edema 13. Hyperlipidemia. Pravachol 14. Pre-diabetes. Hemoglobin A1c 5.6. SSI    LOS: 5 days A FACE TO FACE EVALUATION WAS PERFORMED  Kaitlyn Underwood 07/15/2018, 8:41 AM

## 2018-07-15 NOTE — Progress Notes (Signed)
Physical Therapy Session Note  Patient Details  Name: Kaitlyn Underwood MRN: [id number] Date of Birth: 01/31/1941  Today's Date: 07/15/2018 PT Individual Time: 1130-1200 and 1515- 1630 PT Individual Time Calculation (min): 30 min and 75 min   Short Term Goals: Week 1:  PT Short Term Goal 1 (Week 1): Pt will perform stand pivot transfer with RW at [facility] level PT Short Term Goal 2 (Week 1): Pt will ambulate up to 150' with LRAD at min assist level PT Short Term Goal 3 (Week 1): Pt will ascend/descend 4, 6" stairs using 2 handrails at min assist level  Skilled Therapeutic Interventions/Progress Updates:   Session 1:  Pt supine in bed upon PT arrival, agreeable to therapy tx and denies pain. Pt transferred to sitting EOB with min assist. Therapist donned shoes total assist for time management. Pt reports having to use bathroom, sit<>stand from EOB with RW and mod assist. Pt ambulated from bed<>bathroom with RW and min assist, verbal cues for AD management when turning. Pt requiring min-mod assist for standing balance with clothing management. Pt ambulated to w/c with RW and min assist, seated in w/c to wash hands with set up assist. Pt transported to the gym. Pt performed 2 x 5 sit<>stands with emphasis on pushing up single UE from w/c rather than pulling up on RW, min assist. Pt transported back to room and left with chair alarm set.   Session 2:  Pt supine in bed upon PT arrival, agreeable to therapy tx and denies pain. Pt transferred to sitting EOB with min assist. Squat pivot transfer to w/c with min assist and transported to the gym. Pt performed squat pivot from w/c<>nustep this session with min assist, used nustep x 6 minutes on workload 4 for global strengthening and endurance. Pt transported to gym. Pt ambulated 3 x 60 ft and 1 x 120 ft this session with RW and min assist working on endurance and activity tolerance, verbal cues for upright posture and decreased UE reliance, increased SOB with longer  distance. Pt worked on standing balance with RW x 2 trials tossing horseshoes, min guard assist. Pt worked on static standing balance without UE support, only able to maintain a few seconds before losing balance posteriorly or reaching for RW. Pt ascended/descended 1 step x 3 this session with B handrails and min assist, step to pattern working on LE strength and endurance. Pt performed seated 2 x 10 LAQ and marches for LE strength. Pt ambulated x 80 ft back towards room with RW and min assist, limited secondary to fatigue and transported the rest of the way. Pt left seated in w/c at end of session with needs in reach and chair alarm set.   Therapy Documentation Precautions:  Precautions Precautions: Fall Precaution Comments: expressive aphasia Restrictions Weight Bearing Restrictions: No   Therapy/Group: Individual Therapy  Netta Corrigan, PT, DPT 07/15/2018, 8:02 AM

## 2018-07-15 NOTE — Progress Notes (Signed)
Unable to obtain PIV access. See flowsheet. MD and RN in room. Discussed options for access. MD states he will order midline.

## 2018-07-15 NOTE — Progress Notes (Signed)
Social Work Patient ID: Artis Flock, female   DOB: [birth year], 77 y.o.   MRN: [id number]   CSW met with pt to update her on today's team conference discussion and targeted d/c date.  Told pt of her diet upgrades and she was pleased, but was hopeful to be discharged today, so was disappointed when CSW told her it would be 07-25-18.  Explained things that still needed to be worked on and she agreed.  Called pt's dtr, Juliann Pulse, at pt/family request to update her and she was pleased pt was progressing, but glad she still has more therapy time.  She thinks pt is disappointed by the date because she misses her dog.  Family plans to bring the dog to pt for Thanksgiving tomorrow.  CSW talked to Juliann Pulse about family education next week prior to d/c and she will schedule that with CSW.  Pt does not have a BSC and may need one.  She has a w/c, rolling walker, and shower seat.  CSW also met husband later in the day and updated him on the above, as well.  CSW will continue to follow and assist as needed.

## 2018-07-15 NOTE — Progress Notes (Signed)
Occupational Therapy Session Note  Patient Details  Name: LUWANA BUTRICK MRN: [id number] Date of Birth: 17-Aug-1941  Today's Date: 07/15/2018 OT Individual Time:   830-845 and  2706-2376 OT Individual Time Calculation (min): 22 min  Total individual time=37 minutes  Skilled Therapeutic Interventions/Progress Updates: Upon approach for OT patient receiving services from IV nurse, trying to locate a viable vein.  Apparently, according to IV nurse, trying to complete IV patient procedure required extra time due to patient vein health for IV placement.  So, this clinician worked in between IV nurse, respiratory therapist, and phlebotomist. Patient missed 23 minutes of her care.  In the middle of helping patient with pericleansing as she requested,  Technicians came up to take patient to her swallowing 9:30 appt.   Patient care was quickly wrapped up.   Patient required max A at bed level, but she likely would have been higher functioning for self care if time would have permitted pericleansing edge of bed or in w/c at sink.  So, patient was quickly assisted with changing brief and donning pants and handed off to the technicians who were trying to help her get to her swallowing test on time.     Therapy Documentation Precautions:  Precautions Precautions: Fall Precaution Comments: expressive aphasia Restrictions Weight Bearing Restrictions: No  Therapy/Group: Individual Therapy  Alfredia Ferguson [facility] 07/15/2018, 7:14 PM

## 2018-07-15 NOTE — Progress Notes (Addendum)
Recreational Therapy Session Note  Patient Details  Name: Kaitlyn Underwood MRN: [id number] Date of Birth: 1940-11-29 Today's Date: 07/15/2018 Time:  06-1114 Pain: no c/o Skilled Therapeutic Interventions/Progress Updates:  MEt with pt briefly today to discuss TR services per team request.  Full eval deferred until next week in which I plan to see during a cotreat to assess appropriateness.  Therapy/Group: Individual Therapy   Kyrin Garn 07/15/2018, 2:10 PM

## 2018-07-15 NOTE — Progress Notes (Signed)
Speech Language Pathology Daily Session Note  Patient Details  Name: Kaitlyn Underwood MRN: [id number] Date of Birth: Nov 15, 1940  Today's Date: 07/15/2018 SLP Individual Time: 1200-1225 SLP Individual Time Calculation (min): 25 min  Short Term Goals: Week 1: SLP Short Term Goal 1 (Week 1): Pt will name basic, familiar objects for >75% accuracy with min assist verbal cues.   SLP Short Term Goal 2 (Week 1): Pt will recognize and correct verbal errors with mod assist verbal cues.   SLP Short Term Goal 3 (Week 1): Pt will consume therapeutic trials of honey thick liquids with min cues for use of swallowing precautions and minimal overt s/s of aspiration over 2 consecutive sessions prior to repeat MBS.   SLP Short Term Goal 4 (Week 1): Pt will consume therapeutic trials of dys 2 textures with min cues for use of swallowing precuations over 2 consecutive sessions prior to advancement.   SLP Short Term Goal 5 (Week 1): Pt will utilize external aids to recall basic, daily information with mod assist multimodal cues.    Skilled Therapeutic Interventions: Skilled treatment session focused on dysphagia and speech goals.  SLP facilitated session by providing Min A verbal cues for use of small bites/sips with lunch meal of Dys. 2 textures with nectar-thick liquids. Patient consumed meal without overt s/s of aspiration. Therefore, recommend patient continue current diet with full supervision. Patient verbalizing at the sentence level with increased word-finding deficits noted, suspect due to attempting to verbalize while eating. With extra time and Min A verbal cues, patient able to make her needs known. Patient left upright in wheelchair with all needs within reach and alarm on. Continue with current plan of care.      Pain No/Denies Pain   Therapy/Group: Individual Therapy  Dorraine Ellender 07/15/2018, 3:37 PM

## 2018-07-15 NOTE — Progress Notes (Signed)
Modified Barium Swallow Progress Note  Patient Details  Name: Kaitlyn Underwood MRN: [id number] Date of Birth: 1941-06-16  Today's Date: 07/15/2018  Modified Barium Swallow completed.  Full report located under Chart Review in the Imaging Section.  Brief recommendations include the following:  Clinical Impression  Patient's swallowing function appears much improved since initial MBS. Patient continues to demonstrate a mild oropharyngeal dysphagia characterized by prolonged mastication with solid textures. Patient demonstrates inconsistent swallow initiation throughout study. However, swallow triggers most consistently at the pyriform sinuses.  Although timing can be inconsistent, patient able to protect her airway with exception of 1 episode of penetration with nectar-thick liquids, suspect due to positioning of head.  Patient with intermittent mild lingual and pharyngeal residuals that clear with a second swallow. Although patient able to protect her airway, patient demonstrates fluctuating arousal and cognitive functioning which can impact swallowing safety. Recommend patient upgrade to Dys. 2 textures with nectar-thick liquids with full supervision and initiate the water protocol.     Swallow Evaluation Recommendations       SLP Diet Recommendations: Dysphagia 2 (Fine chop) solids;Free water protocol after oral care;Nectar thick liquid   Liquid Administration via: Cup;No straw   Medication Administration: Crushed with puree   Supervision: Full supervision/cueing for compensatory strategies;Patient able to self feed   Compensations: Minimize environmental distractions;Slow rate;Small sips/bites;Monitor for anterior loss;Multiple dry swallows after each bite/sip   Postural Changes: Seated upright at 90 degrees   Oral Care Recommendations: Oral care BID   Other Recommendations: Order thickener from pharmacy;Remove water pitcher;Have oral suction available;Prohibited food (jello, ice cream,  thin soups)    [name] 07/15/2018,3:38 PM

## 2018-07-16 ENCOUNTER — Encounter (HOSPITAL_COMMUNITY): Payer: Self-pay

## 2018-07-16 LAB — GLUCOSE, CAPILLARY
GLUCOSE-CAPILLARY: 112 mg/dL — AB (ref 70–99)
Glucose-Capillary: 100 mg/dL — ABNORMAL HIGH (ref 70–99)
Glucose-Capillary: 94 mg/dL (ref 70–99)
Glucose-Capillary: 93 mg/dL (ref 70–99)

## 2018-07-16 NOTE — Progress Notes (Signed)
Pt is in room with family and got up to bathroom without staff assist, bed alarm was not alarming, however, it had been turned on previously.  Instructed family pt is only walking with therapy right now that we get her up with the stedy, pt is c/o sob, resp rate 24, lungs clear with shallow breathing, fine rales to bases bilaterally.  Call to respiratory therapy for pt's  Albuterol neb to be given but states that is really just for wheezing and pt is not wheezing.  Pt repositioned higher in the bed and states she is feeling better.  Family at bedside.

## 2018-07-16 NOTE — Progress Notes (Signed)
[facility] PHYSICAL MEDICINE & REHABILITATION PROGRESS NOTE   Subjective/Complaints:    ROS  Neg for CP, SOB, N/V/D, or body aches or pains  Objective:   Dg Chest 2 View  Result Date: 07/14/2018 CLINICAL DATA:  Wheezing EXAM: CHEST - 2 VIEW COMPARISON:  07/08/2018 FINDINGS: Cardiomegaly. No confluent opacities, effusions or edema. No acute bony abnormality. IMPRESSION: Cardiomegaly.  No active disease. Electronically Signed   By: Rolm Baptise M.D.   On: 07/14/2018 10:42   Dg Swallowing Func-speech Pathology  Result Date: 07/15/2018 Objective Swallowing Evaluation: Type of Study: MBS-Modified Barium Swallow Study  Patient Details Name: Kaitlyn Underwood MRN: [id number] Date of Birth: [birth year] Today's Date: 07/15/2018 Time: SLP Start Time (ACUTE ONLY): 0940 -SLP Stop Time (ACUTE ONLY): 1015 SLP Time Calculation (min) (ACUTE ONLY): 35 min Past Medical History: Past Medical History: Diagnosis Date . Anxiety  . Atrial fibrillation ([place])  . Chronic systolic heart failure ([place])  . COPD (chronic obstructive pulmonary disease) ([place])  . Coronary artery disease  . Hypertension  . Hypothyroidism  . Left renal mass   unconfirmed documentation of "kidney cancer", possibly cyst . Pre-diabetes  Past Surgical History: Past Surgical History: Procedure Laterality Date . IR CT HEAD LTD  07/06/2018 . IR PERCUTANEOUS ART THROMBECTOMY/INFUSION INTRACRANIAL INC DIAG ANGIO  07/06/2018 . LEFT HEART CATH AND CORONARY ANGIOGRAPHY N/A 04/29/2017  Procedure: LEFT HEART CATH AND CORONARY ANGIOGRAPHY;  Surgeon: Belva Crome, MD;  Location: [facility];  Service: Cardiovascular;  Laterality: N/A; . RADIOLOGY WITH ANESTHESIA N/A 07/05/2018  Procedure: RADIOLOGY WITH ANESTHESIA;  Surgeon: Luanne Bras, MD;  Location: [facility];  Service: Radiology;  Laterality: N/A; . ULTRASOUND GUIDANCE FOR VASCULAR ACCESS  04/29/2017  Procedure: Ultrasound Guidance For Vascular Access;  Surgeon: Belva Crome, MD;  Location: [facility];  Service: Cardiovascular;; HPI: See H&P  No data recorded Assessment / Plan / Recommendation CHL IP CLINICAL IMPRESSIONS 07/15/2018 Clinical Impression Patient's swallowing function appears much improved since initial MBS. Patient continues to demonstrate a mild oropharyngeal dysphagia characterized by prolonged mastication with solid textures. Patient demonstrates inconsistent swallow initiation throughout study. However, swallow triggers most consistently at the pyriform sinuses.  Although timing can be inconsistent, patient able to protect her airway with exception of 1 episode of penetration with nectar-thick liquids, suspect due to positioning of head.  Patient with intermittent mild lingual and pharyngeal residuals that clear with a second swallow. Although patient able to protect her airway, patient demonstrates fluctuating arousal and cognitive functioning which can impact swallowing safety. Recommend patient upgrade to Dys. 2 textures with nectar-thick liquids with full supervision and initiate the water protocol.   SLP Visit Diagnosis Dysphagia, unspecified (R13.10);Aphasia (R47.01) Attention and concentration deficit following -- Frontal lobe and executive function deficit following -- Impact on safety and function Moderate aspiration risk;Mild aspiration risk   CHL IP TREATMENT RECOMMENDATION 07/15/2018 Treatment Recommendations Therapy as outlined in treatment plan below   Prognosis 07/15/2018 Prognosis for Safe Diet Advancement Good Barriers to Reach Goals -- Barriers/Prognosis Comment -- CHL IP DIET RECOMMENDATION 07/15/2018 SLP Diet Recommendations Dysphagia 2 (Fine chop) solids;Free water protocol after oral care;Nectar thick liquid Liquid Administration via Cup;No straw Medication Administration Crushed with puree Compensations Minimize environmental distractions;Slow rate;Small sips/bites;Monitor for anterior loss;Multiple dry swallows after each bite/sip Postural Changes Seated upright at 90  degrees   CHL IP OTHER RECOMMENDATIONS 07/15/2018 Recommended Consults -- Oral Care Recommendations Oral care BID Other Recommendations Order thickener from pharmacy;Remove water pitcher;Have oral suction  available;Prohibited food (jello, ice cream, thin soups)   CHL IP FOLLOW UP RECOMMENDATIONS 07/15/2018 Follow up Recommendations Home health SLP;Outpatient SLP   CHL IP FREQUENCY AND DURATION 07/15/2018 Speech Therapy Frequency (ACUTE ONLY) min 5x/week Treatment Duration 2 weeks      CHL IP ORAL PHASE 07/15/2018 Oral Phase Impaired Oral - Pudding Teaspoon -- Oral - Pudding Cup -- Oral - Honey Teaspoon Lingual/palatal residue Oral - Honey Cup Lingual/palatal residue Oral - Nectar Teaspoon Lingual/palatal residue Oral - Nectar Cup Lingual/palatal residue Oral - Nectar Straw -- Oral - Thin Teaspoon Lingual/palatal residue Oral - Thin Cup Right anterior bolus loss Oral - Thin Straw -- Oral - Puree WFL Oral - Mech Soft Impaired mastication Oral - Regular -- Oral - Multi-Consistency -- Oral - Pill -- Oral Phase - Comment --  CHL IP PHARYNGEAL PHASE 07/15/2018 Pharyngeal Phase Impaired Pharyngeal- Pudding Teaspoon -- Pharyngeal -- Pharyngeal- Pudding Cup -- Pharyngeal -- Pharyngeal- Honey Teaspoon Delayed swallow initiation-vallecula;Pharyngeal residue - valleculae Pharyngeal -- Pharyngeal- Honey Cup Delayed swallow initiation-vallecula;Pharyngeal residue - valleculae Pharyngeal -- Pharyngeal- Nectar Teaspoon Delayed swallow initiation-pyriform sinuses;Pharyngeal residue - valleculae Pharyngeal -- Pharyngeal- Nectar Cup Penetration/Aspiration during swallow;Delayed swallow initiation-pyriform sinuses;Pharyngeal residue - valleculae Pharyngeal Material enters airway, CONTACTS cords and then ejected out Pharyngeal- Nectar Straw -- Pharyngeal -- Pharyngeal- Thin Teaspoon Delayed swallow initiation-pyriform sinuses;Pharyngeal residue - valleculae;Pharyngeal residue - pyriform Pharyngeal -- Pharyngeal- Thin Cup Delayed swallow  initiation-pyriform sinuses;Pharyngeal residue - valleculae;Pharyngeal residue - pyriform Pharyngeal -- Pharyngeal- Thin Straw -- Pharyngeal -- Pharyngeal- Puree WFL Pharyngeal -- Pharyngeal- Mechanical Soft WFL Pharyngeal -- Pharyngeal- Regular -- Pharyngeal -- Pharyngeal- Multi-consistency -- Pharyngeal -- Pharyngeal- Pill -- Pharyngeal -- Pharyngeal Comment --  CHL IP CERVICAL ESOPHAGEAL PHASE 07/15/2018 Cervical Esophageal Phase WFL Pudding Teaspoon -- Pudding Cup -- Honey Teaspoon -- Honey Cup -- Nectar Teaspoon -- Nectar Cup -- Nectar Straw -- Thin Teaspoon -- Thin Cup -- Thin Straw -- Puree -- Mechanical Soft -- Regular -- Multi-consistency -- Pill -- Cervical Esophageal Comment -- PAYNE, COURTNEY 07/15/2018, 3:25 PM              No results for input(s): WBC, HGB, HCT, PLT in the last 72 hours. No results for input(s): NA, K, CL, CO2, GLUCOSE, BUN, CREATININE, CALCIUM in the last 72 hours.  Intake/Output Summary (Last 24 hours) at 07/16/2018 0947 Last data filed at 07/16/2018 0745 Gross per 24 hour  Intake 822 ml  Output -  Net 822 ml     Physical Exam: Vital Signs Blood pressure (!) 149/66, pulse (!) 59, temperature 98.3 F (36.8 C), temperature source Oral, resp. rate 18, height 5\' 2"  (1.575 m), weight 90.4 kg, SpO2 98 %.   General: No acute distress Mood and affect are appropriate Heart: Regular rate and rhythm no rubs murmurs or extra sounds Lungs: Clear to auscultation, breathing unlabored, +wheezes bilaterally Abdomen: Positive bowel sounds, soft nontender to palpation, nondistended Extremities: No clubbing, cyanosis, or edema Skin: No evidence of breakdown, no evidence of rash Neurologic: Cranial nerves II through XII intact, motor strength is 5/5 in Left and 4/5 on Right  deltoid, bicep, tricep, grip, hip flexor, knee extensors, ankle dorsiflexor and plantar flexor Sensory exam normal sensation to light touch and proprioception in bilateral upper and lower  extremities Cerebellar exam normal finger to nose to finger as well as heel to shin in bilateral upper and lower extremities Musculoskeletal: Full range of motion in all 4 extremities. No joint swelling   Assessment/Plan: 1. Functional deficits secondary to  Left MCA infarct  which require 3+ hours per day of interdisciplinary therapy in a comprehensive inpatient rehab setting.  Physiatrist is providing close team supervision and 24 hour management of active medical problems listed below.  Physiatrist and rehab team continue to assess barriers to discharge/monitor patient progress toward functional and medical goals  Care Tool:  Bathing    Body parts bathed by patient: Right arm, Left arm, Chest, Abdomen, Right upper leg, Left upper leg, Right lower leg, Left lower leg, Face, Front perineal area, Buttocks     Body parts n/a: Buttocks, Front perineal area(did not attempt secondary to female therapist)   Bathing assist Assist Level: Contact Guard/Touching assist     Upper Body Dressing/Undressing Upper body dressing   What is the patient wearing?: Bra, Pull over shirt    Upper body assist Assist Level: Minimal Assistance - Patient > 75%    Lower Body Dressing/Undressing Lower body dressing      What is the patient wearing?: Incontinence brief, Pants     Lower body assist Assist for lower body dressing: Moderate Assistance - Patient 50 - 74%     Toileting Toileting    Toileting assist Assist for toileting: Contact Guard/Touching assist     Transfers Chair/bed transfer  Transfers assist  Chair/bed transfer activity did not occur: Safety/medical concerns  Chair/bed transfer assist level: Moderate Assistance - Patient 50 - 74%     Locomotion Ambulation   Ambulation assist      Assist level: Minimal Assistance - Patient > 75% Assistive device: Walker-rolling Max distance: 120 ft   Walk 10 feet activity   Assist     Assist level: Minimal Assistance -  Patient > 75% Assistive device: Walker-rolling   Walk 50 feet activity   Assist    Assist level: Minimal Assistance - Patient > 75% Assistive device: Walker-rolling    Walk 150 feet activity   Assist Walk 150 feet activity did not occur: Safety/medical concerns         Walk 10 feet on uneven surface  activity   Assist Walk 10 feet on uneven surfaces activity did not occur: Safety/medical concerns         Wheelchair     Assist   Type of Wheelchair: Manual    Wheelchair assist level: Contact Guard/Touching assist Max wheelchair distance: 50    Wheelchair 50 feet with 2 turns activity    Assist        Assist Level: Contact Guard/Touching assist   Wheelchair 150 feet activity     Assist Wheelchair 150 feet activity did not occur: Safety/medical concerns        Medical Problem List and Plan: 1.Right side weakness with facial droop and aphasiasecondary to left MCA infarction tent d/c 12/7 2. DVT Prophylaxis/Anticoagulation: Eliquis 3. Pain Management:Tylenol as needed 4. Mood:Zoloft 25 mg daily 5. Neuropsych: This patientiscapable of making decisions on herown behalf. 6. Skin/Wound Care:Routine skin checks 7. Fluids/Electrolytes/Nutrition:Routine in and out's with follow-up chemistries in am trying to avoid IV since pt would require a midline 8. Dysphagia. Dysphagia #1 pudding thick liquids. Upgrade to D2 nectar on 11/27 Monitor hydration. Follow-up speech therapy I 739ml yesterday 9. Hypertension. Toprol 25mg , HR running low will reduce Vitals:   07/16/18 0447 07/16/18 0909  BP: (!) 149/66   Pulse: (!) 57 (!) 59  Resp: 20 18  Temp: 98.3 F (36.8 C)   SpO2: 99% 98%  BP in desired range 11/28 10. Atrial fibrillation. Lanoxin 0.125 mg daily. Cardiac  rate controlled- check dig level 11.COPD.pt states she has not received inhaler this am - on Singulair 10mg  qhs, Pulmicort .5 BID neb, prn albuterol   12.Chronic systolic congestive heart failure.Monitor for any signs of fluid overload, no peripheral edema or rales  -daily weights ordered, check CXR given wheezes and IVF at noc, CXR shows no pulm edema, also has no LE edema 13. Hyperlipidemia. Pravachol 14. Pre-diabetes. Hemoglobin A1c 5.6. SSI    LOS: 6 days A FACE TO FACE EVALUATION WAS PERFORMED  Charlett Blake 07/16/2018, 9:47 AM

## 2018-07-17 ENCOUNTER — Inpatient Hospital Stay (HOSPITAL_COMMUNITY): Payer: Self-pay

## 2018-07-17 ENCOUNTER — Inpatient Hospital Stay (HOSPITAL_COMMUNITY): Payer: PPO | Admitting: Speech Pathology

## 2018-07-17 ENCOUNTER — Inpatient Hospital Stay (HOSPITAL_COMMUNITY): Payer: PPO

## 2018-07-17 LAB — BASIC METABOLIC PANEL
ANION GAP: 9 (ref 5–15)
BUN: 12 mg/dL (ref 8–23)
CO2: 22 mmol/L (ref 22–32)
Calcium: 9.1 mg/dL (ref 8.9–10.3)
Chloride: 108 mmol/L (ref 98–111)
Creatinine, Ser: 0.79 mg/dL (ref 0.44–1.00)
GFR calc Af Amer: >60 mL/min (ref >60)
Glucose, Bld: 87 mg/dL (ref 70–99)
POTASSIUM: 4.7 mmol/L (ref 3.5–5.1)
SODIUM: 139 mmol/L (ref 135–145)

## 2018-07-17 LAB — GLUCOSE, CAPILLARY
GLUCOSE-CAPILLARY: 93 mg/dL (ref 70–99)
GLUCOSE-CAPILLARY: 94 mg/dL (ref 70–99)
GLUCOSE-CAPILLARY: 95 mg/dL (ref 70–99)
GLUCOSE-CAPILLARY: 97 mg/dL (ref 70–99)

## 2018-07-17 NOTE — Progress Notes (Signed)
Speech Language Pathology Weekly Progress and Session Note  Patient Details  Name: Kaitlyn Underwood MRN: [id number] Date of Birth: 1940/10/20  Beginning of progress report period: July 10, 2018 End of progress report period: July 17, 2018  Today's Date: 07/17/2018 SLP Individual Time: 0700-0730 SLP Individual Time Calculation (min): 30 min  Short Term Goals: Week 1: SLP Short Term Goal 1 (Week 1): Pt will name basic, familiar objects for >75% accuracy with min assist verbal cues.   SLP Short Term Goal 1 - Progress (Week 1): Met SLP Short Term Goal 2 (Week 1): Pt will recognize and correct verbal errors with mod assist verbal cues.   SLP Short Term Goal 2 - Progress (Week 1): Met SLP Short Term Goal 3 (Week 1): Pt will consume therapeutic trials of honey thick liquids with min cues for use of swallowing precautions and minimal overt s/s of aspiration over 2 consecutive sessions prior to repeat MBS.   SLP Short Term Goal 3 - Progress (Week 1): Met SLP Short Term Goal 4 (Week 1): Pt will consume therapeutic trials of dys 2 textures with min cues for use of swallowing precuations over 2 consecutive sessions prior to advancement.   SLP Short Term Goal 4 - Progress (Week 1): Met SLP Short Term Goal 5 (Week 1): Pt will utilize external aids to recall basic, daily information with mod assist multimodal cues.   SLP Short Term Goal 5 - Progress (Week 1): Met    New Short Term Goals: Week 2: SLP Short Term Goal 1 (Week 2): Patient will utilize external aids to recall basic, daily information with min assist multimodal cues.   SLP Short Term Goal 2 (Week 2): Patient will recognize and correct verbal errors with min assist verbal cues.   SLP Short Term Goal 3 (Week 2): Patient will name basic, familiar objects for ~90% accuracy with supervision verbal cues.   SLP Short Term Goal 4 (Week 2): Patient will consume current diet with minimal overt s/s of aspiration and supervision verbal cues for use  of swallowing compensatory strategies.  SLP Short Term Goal 5 (Week 2): Patient will consume thin liquids via cup with minimal overt s/s of aspiration over 2 sessions with supervision verbal cues prior to upgrade.   Weekly Progress Updates:Patient has made excellent gains and has met 5 of 5 STGs this reporting period. Currently, patient is consuming Dys. 2 textures with nectar-thick liquids with minimal overt s/s of aspiration and requires overall Min A verbal cues for use of swallowing compensatory strategies. Patient demonstrates improved recall of functional information with Mod A verbal and visual cues. Patient's verbal expression has also improved but patient requires overall Mod A verbal cues to self-monitor and correct verbal errors. Patient and family education ongoing. Patient would benefit from continued skilled SLP intervention to maximize her cognitive-linguistic and swallowing function prior to discharge.      Intensity: Minumum of 1-2 x/day, 30 to 90 minutes Frequency: 3 to 5 out of 7 days Duration/Length of Stay: 12/7 Treatment/Interventions: Cognitive remediation/compensation;Cueing hierarchy;Dysphagia/aspiration precaution training;Environmental controls;Functional tasks;Internal/external aids;Oral motor exercises;Patient/family education;Speech/Language facilitation   Daily Session  Skilled Therapeutic Interventions:  Skilled treatment session focused on dysphagia and communication goals. SLP facilitated session by providing supervision verbal cues for use of swallowing compensatotry strategies with breakfast meal of Dys. 2 textures with nectar-thick liquids. Patient consumed meal without overt s/s of aspiration, therefore, recommend patient continue her current diet. SLP also facilitated session by providing Min A sentence completion cues for  naming of functional items with 100% accuracy. However, moderate verbal cues needed to self-monitor and correct verbal errors at the sentence  level during a basic conversation. Patient left upright in bed with alarm on and all needs within reach. Continue with current plan of care.      Pain No/Denies Pain   Therapy/Group: Individual Therapy  Bolton Canupp 07/17/2018, 10:03 AM

## 2018-07-17 NOTE — Progress Notes (Signed)
Physical Therapy Weekly Progress Note  Patient Details  Name: Kaitlyn Underwood MRN: [id number] Date of Birth: April 28, 1941  Beginning of progress report period: July 11, 2018 End of progress report period: July 17, 2018  Today's Date: 07/17/2018 PT Individual Time: 1751-0258 and 1300-1355 PT Individual Time Calculation (min): 30 min   Patient has met 3 of 3 short term goals.  Pt is progressing with all functional mobility and currently requires up to min assist over all for ambulation with a RW. Pt continues to mostly be limited by decreased endurance and LE weakness. Pt fatigues very quickly with all functional mobility requiring multiple therapeutic rest breaks throughout sessions.    Patient continues to demonstrate the following deficits muscle weakness, decreased coordination and decreased motor planning and decreased standing balance, decreased postural control, hemiplegia and decreased balance strategies and therefore will continue to benefit from skilled PT intervention to increase functional independence with mobility.  Patient progressing toward long term goals..  Continue plan of care.  PT Short Term Goals Week 1:  PT Short Term Goal 1 (Week 1): Pt will perform stand pivot transfer with RW at [facility] level PT Short Term Goal 1 - Progress (Week 1): Met PT Short Term Goal 2 (Week 1): Pt will ambulate up to 150' with LRAD at min assist level PT Short Term Goal 2 - Progress (Week 1): Met PT Short Term Goal 3 (Week 1): Pt will ascend/descend 4, 6" stairs using 2 handrails at min assist level PT Short Term Goal 3 - Progress (Week 1): Met Week 2:  PT Short Term Goal 1 (Week 2): STG=LTG due to ELOS  Skilled Therapeutic Interventions/Progress Updates:  Ambulation/gait training;Community reintegration;DME/adaptive equipment instruction;Neuromuscular re-education;Psychosocial support;Stair training;UE/LE Strength taining/ROM;Wheelchair propulsion/positioning;Balance/vestibular  training;Discharge planning;Therapeutic Activities;UE/LE Coordination activities;Cognitive remediation/compensation;Disease management/prevention;Functional mobility training;Patient/family education;Splinting/orthotics;Therapeutic Exercise;Visual/perceptual remediation/compensation   Session 1: Pt supine in bed upon PT arrival, agreeable to therapy tx and denies pain. Pt transferred to sitting EOB with supervision, verbal cues for anterior scoot to EOB with supervision. Pt donned shirt with supervision, looped legs through pants with supervision, total assist to don teds/shoes. Pt performed sit<>stand with RW and mod assist to pull pants over hips. Stand pivot to w/c with CGA. Pt propelled w/c from room>gym x 100 ft using B UEs/LEs with supervision. Pt ambulated x 150 ft with min assist and RW working on endurance/activity tolerance. Pt performed x 5 sit<>stands with emphasis on pushing up from w/c arm rest with single UE, working on LE strength, min assist. Pt transported back to room and left seated in w/c with needs in reach and chair alarm set.   Session 2:  Pt seated in w/c upon PT arrival, agreeable to therapy tx and denies pain. Pt transported from room>gym. Pt ambulated 3 x 80 ft this session with RW and CGA working on endurance/activity tolerance, verbal cues for upright posture, pt with increased work of breathing following. Pt worked on standing activity tolerance and balance in order to complete peg board puzzle, pt requires single UE support at all times on RW otherwise loses balance posteriorly and increased fear of falling. Pt performed x 10 sit<>stands pushing up from the mat with min guard assist, working on LE strength and endurance. Pt performed 2 x 30 sec hamstring stretches bilaterally, propping LE up on a stool. Pt performed 2 x 10 LAQ with 3# ankle weights for LE strengthening. Pt transferred to w/c min guard and transported to dayroom. Pt transferred w/c<>nustep with min guard stand  pivot and  used nustep x 5 minutes on workload 5 for global strengthening and endurance. Pt transferred to bed CGA and left supine with needs in reach and alarm set.     Therapy Documentation Precautions:  Precautions Precautions: Fall Precaution Comments: expressive aphasia Restrictions Weight Bearing Restrictions: No   Therapy/Group: Individual Therapy  Netta Corrigan, PT, DPT 07/17/2018, 7:55 AM

## 2018-07-17 NOTE — Progress Notes (Signed)
[place] PHYSICAL MEDICINE & REHABILITATION PROGRESS NOTE   Subjective/Complaints:  Pt is coughing when drinking while in supine position, both SLP and MD discussed importance of being in the full upright position to drink or eat.  Also discussed her susceptiblity to pneumonia given her hx of COPD  ROS  Neg for CP, SOB, N/V/D, or body aches or pains  Objective:   Dg Swallowing Func-speech Pathology  Result Date: 07/15/2018 Objective Swallowing Evaluation: Type of Study: MBS-Modified Barium Swallow Study  Patient Details Name: Kaitlyn Underwood MRN: [id number] Date of Birth: June 16, 1941 Today's Date: 07/15/2018 Time: SLP Start Time (ACUTE ONLY): 0940 -SLP Stop Time (ACUTE ONLY): 1015 SLP Time Calculation (min) (ACUTE ONLY): 35 min Past Medical History: Past Medical History: Diagnosis Date . Anxiety  . Atrial fibrillation ([place])  . Chronic systolic heart failure ([place])  . COPD (chronic obstructive pulmonary disease) ([place])  . Coronary artery disease  . Hypertension  . Hypothyroidism  . Left renal mass   unconfirmed documentation of "kidney cancer", possibly cyst . Pre-diabetes  Past Surgical History: Past Surgical History: Procedure Laterality Date . IR CT HEAD LTD  07/06/2018 . IR PERCUTANEOUS ART THROMBECTOMY/INFUSION INTRACRANIAL INC DIAG ANGIO  07/06/2018 . LEFT HEART CATH AND CORONARY ANGIOGRAPHY N/A 04/29/2017  Procedure: LEFT HEART CATH AND CORONARY ANGIOGRAPHY;  Surgeon: Belva Crome, MD;  Location: [place] [facility];  Service: Cardiovascular;  Laterality: N/A; . RADIOLOGY WITH ANESTHESIA N/A 07/05/2018  Procedure: RADIOLOGY WITH ANESTHESIA;  Surgeon: Luanne Bras, MD;  Location: [place];  Service: Radiology;  Laterality: N/A; . ULTRASOUND GUIDANCE FOR VASCULAR ACCESS  04/29/2017  Procedure: Ultrasound Guidance For Vascular Access;  Surgeon: Belva Crome, MD;  Location: [place] [facility];  Service: Cardiovascular;; HPI: See H&P  No data recorded Assessment / Plan / Recommendation CHL IP CLINICAL  IMPRESSIONS 07/15/2018 Clinical Impression Patient's swallowing function appears much improved since initial MBS. Patient continues to demonstrate a mild oropharyngeal dysphagia characterized by prolonged mastication with solid textures. Patient demonstrates inconsistent swallow initiation throughout study. However, swallow triggers most consistently at the pyriform sinuses.  Although timing can be inconsistent, patient able to protect her airway with exception of 1 episode of penetration with nectar-thick liquids, suspect due to positioning of head.  Patient with intermittent mild lingual and pharyngeal residuals that clear with a second swallow. Although patient able to protect her airway, patient demonstrates fluctuating arousal and cognitive functioning which can impact swallowing safety. Recommend patient upgrade to Dys. 2 textures with nectar-thick liquids with full supervision and initiate the water protocol.   SLP Visit Diagnosis Dysphagia, unspecified (R13.10);Aphasia (R47.01) Attention and concentration deficit following -- Frontal lobe and executive function deficit following -- Impact on safety and function Moderate aspiration risk;Mild aspiration risk   CHL IP TREATMENT RECOMMENDATION 07/15/2018 Treatment Recommendations Therapy as outlined in treatment plan below   Prognosis 07/15/2018 Prognosis for Safe Diet Advancement Good Barriers to Reach Goals -- Barriers/Prognosis Comment -- CHL IP DIET RECOMMENDATION 07/15/2018 SLP Diet Recommendations Dysphagia 2 (Fine chop) solids;Free water protocol after oral care;Nectar thick liquid Liquid Administration via Cup;No straw Medication Administration Crushed with puree Compensations Minimize environmental distractions;Slow rate;Small sips/bites;Monitor for anterior loss;Multiple dry swallows after each bite/sip Postural Changes Seated upright at 90 degrees   CHL IP OTHER RECOMMENDATIONS 07/15/2018 Recommended Consults -- Oral Care Recommendations Oral care BID  Other Recommendations Order thickener from pharmacy;Remove water pitcher;Have oral suction available;Prohibited food (jello, ice cream, thin soups)   CHL IP FOLLOW UP RECOMMENDATIONS 07/15/2018 Follow up  Recommendations Home health SLP;Outpatient SLP   CHL IP FREQUENCY AND DURATION 07/15/2018 Speech Therapy Frequency (ACUTE ONLY) min 5x/week Treatment Duration 2 weeks      CHL IP ORAL PHASE 07/15/2018 Oral Phase Impaired Oral - Pudding Teaspoon -- Oral - Pudding Cup -- Oral - Honey Teaspoon Lingual/palatal residue Oral - Honey Cup Lingual/palatal residue Oral - Nectar Teaspoon Lingual/palatal residue Oral - Nectar Cup Lingual/palatal residue Oral - Nectar Straw -- Oral - Thin Teaspoon Lingual/palatal residue Oral - Thin Cup Right anterior bolus loss Oral - Thin Straw -- Oral - Puree WFL Oral - Mech Soft Impaired mastication Oral - Regular -- Oral - Multi-Consistency -- Oral - Pill -- Oral Phase - Comment --  CHL IP PHARYNGEAL PHASE 07/15/2018 Pharyngeal Phase Impaired Pharyngeal- Pudding Teaspoon -- Pharyngeal -- Pharyngeal- Pudding Cup -- Pharyngeal -- Pharyngeal- Honey Teaspoon Delayed swallow initiation-vallecula;Pharyngeal residue - valleculae Pharyngeal -- Pharyngeal- Honey Cup Delayed swallow initiation-vallecula;Pharyngeal residue - valleculae Pharyngeal -- Pharyngeal- Nectar Teaspoon Delayed swallow initiation-pyriform sinuses;Pharyngeal residue - valleculae Pharyngeal -- Pharyngeal- Nectar Cup Penetration/Aspiration during swallow;Delayed swallow initiation-pyriform sinuses;Pharyngeal residue - valleculae Pharyngeal Material enters airway, CONTACTS cords and then ejected out Pharyngeal- Nectar Straw -- Pharyngeal -- Pharyngeal- Thin Teaspoon Delayed swallow initiation-pyriform sinuses;Pharyngeal residue - valleculae;Pharyngeal residue - pyriform Pharyngeal -- Pharyngeal- Thin Cup Delayed swallow initiation-pyriform sinuses;Pharyngeal residue - valleculae;Pharyngeal residue - pyriform Pharyngeal --  Pharyngeal- Thin Straw -- Pharyngeal -- Pharyngeal- Puree WFL Pharyngeal -- Pharyngeal- Mechanical Soft WFL Pharyngeal -- Pharyngeal- Regular -- Pharyngeal -- Pharyngeal- Multi-consistency -- Pharyngeal -- Pharyngeal- Pill -- Pharyngeal -- Pharyngeal Comment --  CHL IP CERVICAL ESOPHAGEAL PHASE 07/15/2018 Cervical Esophageal Phase WFL Pudding Teaspoon -- Pudding Cup -- Honey Teaspoon -- Honey Cup -- Nectar Teaspoon -- Nectar Cup -- Nectar Straw -- Thin Teaspoon -- Thin Cup -- Thin Straw -- Puree -- Mechanical Soft -- Regular -- Multi-consistency -- Pill -- Cervical Esophageal Comment -- PAYNE, COURTNEY 07/15/2018, 3:25 PM              No results for input(s): WBC, HGB, HCT, PLT in the last 72 hours. Recent Labs    07/17/18 0624  NA 139  K 4.7  CL 108  CO2 22  GLUCOSE 87  BUN 12  CREATININE 0.79  CALCIUM 9.1    Intake/Output Summary (Last 24 hours) at 07/17/2018 0931 Last data filed at 07/17/2018 0834 Gross per 24 hour  Intake 460 ml  Output -  Net 460 ml     Physical Exam: Vital Signs Blood pressure 110/80, pulse 70, temperature 97.7 F (36.5 C), temperature source Oral, resp. rate 18, height 5\' 2"  (1.575 m), weight 90.4 kg, SpO2 98 %.   General: No acute distress Mood and affect are appropriate Heart: Regular rate and rhythm no rubs murmurs or extra sounds Lungs: Clear to auscultation, breathing unlabored, +wheezes bilaterally Abdomen: Positive bowel sounds, soft nontender to palpation, nondistended Extremities: No clubbing, cyanosis, or edema Skin: No evidence of breakdown, no evidence of rash Neurologic: Cranial nerves II through XII intact, motor strength is 5/5 in Left and 4/5 on Right  deltoid, bicep, tricep, grip, hip flexor, knee extensors, ankle dorsiflexor and plantar flexor Sensory exam normal sensation to light touch and proprioception in bilateral upper and lower extremities Cerebellar exam normal finger to nose to finger as well as heel to shin in bilateral upper  and lower extremities Musculoskeletal: Full range of motion in all 4 extremities. No joint swelling   Assessment/Plan: 1. Functional deficits secondary to Left MCA infarct  which require 3+ hours per day of interdisciplinary therapy in a comprehensive inpatient rehab setting.  Physiatrist is providing close team supervision and 24 hour management of active medical problems listed below.  Physiatrist and rehab team continue to assess barriers to discharge/monitor patient progress toward functional and medical goals  Care Tool:  Bathing    Body parts bathed by patient: Right arm, Left arm, Chest, Abdomen, Right upper leg, Left upper leg, Right lower leg, Left lower leg, Face, Front perineal area, Buttocks     Body parts n/a: Buttocks, Front perineal area(did not attempt secondary to female therapist)   Bathing assist Assist Level: Contact Guard/Touching assist     Upper Body Dressing/Undressing Upper body dressing   What is the patient wearing?: Bra, Pull over shirt    Upper body assist Assist Level: Minimal Assistance - Patient > 75%    Lower Body Dressing/Undressing Lower body dressing      What is the patient wearing?: Incontinence brief, Pants     Lower body assist Assist for lower body dressing: Moderate Assistance - Patient 50 - 74%     Toileting Toileting    Toileting assist Assist for toileting: Contact Guard/Touching assist     Transfers Chair/bed transfer  Transfers assist  Chair/bed transfer activity did not occur: Safety/medical concerns  Chair/bed transfer assist level: Moderate Assistance - Patient 50 - 74%     Locomotion Ambulation   Ambulation assist      Assist level: Minimal Assistance - Patient > 75% Assistive device: Walker-rolling Max distance: 120 ft   Walk 10 feet activity   Assist     Assist level: Minimal Assistance - Patient > 75% Assistive device: Walker-rolling   Walk 50 feet activity   Assist    Assist level:  Minimal Assistance - Patient > 75% Assistive device: Walker-rolling    Walk 150 feet activity   Assist Walk 150 feet activity did not occur: Safety/medical concerns         Walk 10 feet on uneven surface  activity   Assist Walk 10 feet on uneven surfaces activity did not occur: Safety/medical concerns         Wheelchair     Assist   Type of Wheelchair: Manual    Wheelchair assist level: Contact Guard/Touching assist Max wheelchair distance: 50    Wheelchair 50 feet with 2 turns activity    Assist        Assist Level: Contact Guard/Touching assist   Wheelchair 150 feet activity     Assist Wheelchair 150 feet activity did not occur: Safety/medical concerns        Medical Problem List and Plan: 1.Right side weakness with facial droop and aphasiasecondary to left MCA infarction tent d/c 12/7- Cont CIR PT, OT, SLP 2. DVT Prophylaxis/Anticoagulation: Eliquis 3. Pain Management:Tylenol as needed 4. Mood:Zoloft 25 mg daily 5. Neuropsych: This patientiscapable of making decisions on herown behalf. 6. Skin/Wound Care:Routine skin checks 7. Fluids/Electrolytes/Nutrition:I 680ml on 11/28BMET normal 11/29, hold off on IV fluid supplementation    8. Dysphagia. Dysphagia #1 pudding thick liquids. Upgrade to D2 nectar on 11/27 Monitor hydration. Follow-up speech therapy I 784ml yesterday 9. Hypertension. Toprol 25mg , HR running low will reduce Vitals:   07/17/18 0525 07/17/18 0848  BP: 110/80   Pulse: 60 70  Resp: 18 18  Temp: 97.7 F (36.5 C)   SpO2: 98% 98%  BP in desired range 11/29 10. Atrial fibrillation. Lanoxin 0.125 mg daily. Cardiac rate controlled- check dig level 11.COPD.pt  states she has not received inhaler this am - on Singulair 10mg  qhs, Pulmicort .5 BID neb, prn albuterol  12.Chronic systolic congestive heart failure.Monitor for any signs of fluid overload, no peripheral edema or rales  -daily  weights ordered, check CXR given wheezes and IVF at noc, CXR shows no pulm edema, also has no LE edema 13. Hyperlipidemia. Pravachol 14. Pre-diabetes. Hemoglobin A1c 5.6. SSI    LOS: 7 days A FACE TO FACE EVALUATION WAS PERFORMED  Charlett Blake 07/17/2018, 9:31 AM

## 2018-07-17 NOTE — Plan of Care (Signed)
  Problem: Consults Goal: RH STROKE PATIENT EDUCATION Description See Patient Education module for education specifics  Outcome: Progressing Goal: Nutrition Consult-if indicated Outcome: Progressing Goal: Diabetes Guidelines if Diabetic/Glucose > 140 Description If diabetic or lab glucose is > 140 mg/dl - Initiate Diabetes/Hyperglycemia Guidelines & Document Interventions  Outcome: Progressing   Problem: RH BOWEL ELIMINATION Goal: RH STG MANAGE BOWEL WITH ASSISTANCE Description STG Manage Bowel with Mod I Assistance.  Outcome: Progressing Goal: RH STG MANAGE BOWEL W/MEDICATION W/ASSISTANCE Description STG Manage Bowel with Medication with Mod I Assistance.  Outcome: Progressing   Problem: RH BLADDER ELIMINATION Goal: RH STG MANAGE BLADDER WITH ASSISTANCE Description STG Manage Bladder With Mod I Assistance  Outcome: Progressing Goal: RH STG MANAGE BLADDER WITH EQUIPMENT WITH ASSISTANCE Description STG Manage Bladder With Equipment With Mod I Assistance  Outcome: Progressing   Problem: RH SAFETY Goal: RH STG ADHERE TO SAFETY PRECAUTIONS W/ASSISTANCE/DEVICE Description STG Adhere to Safety Precautions With Mod I Assistance/Device.  Outcome: Progressing Goal: RH STG DECREASED RISK OF FALL WITH ASSISTANCE Description STG Decreased Risk of Fall With Mod I Assistance.  Outcome: Progressing   Problem: RH KNOWLEDGE DEFICIT Goal: RH STG INCREASE KNOWLEDGE OF DIABETES Description With Min Assistance  Outcome: Progressing Goal: RH STG INCREASE KNOWLEDGE OF HYPERTENSION Description With Min Assistance   Outcome: Progressing Goal: RH STG INCREASE KNOWLEDGE OF DYSPHAGIA/FLUID INTAKE Description With Mod I Assistance   Outcome: Progressing Goal: RH STG INCREASE KNOWLEGDE OF HYPERLIPIDEMIA Description With Min Assistance   Outcome: Progressing Goal: RH STG INCREASE KNOWLEDGE OF STROKE PROPHYLAXIS Description With Min Assistance   Outcome: Progressing

## 2018-07-17 NOTE — Progress Notes (Signed)
Occupational Therapy Session Note  Patient Details  Name: Kaitlyn Underwood MRN: [id number] Date of Birth: 09-27-1940  Today's Date: 07/17/2018 OT Individual Time: 1445-1555 OT Individual Time Calculation (min): 70 min    Short Term Goals: Week 1:  OT Short Term Goal 1 (Week 1): Pt's STGs are equal to LTGs set at supervision level.    Skilled Therapeutic Interventions/Progress Updates:    1:1. Pt received seated in bed asleep, easily aroused. Pt completes ambualtion to bathroom with RW and min A to transfer to toilet. Pt requires min increasing to MOD A for standing balance during clothing management. Pt fearful of falling and letting go of grab bar with hunched posture despite VC. Overall pt requries MOD A for toileting components.   Pt completes standing dynavision trials with CGA for standing balance at RW with VC for terminal hip extension to improve standing balance and tolerance required for ADLs. Pt stands 1 min while locating stimulus in all 4 quadrants in standing as follows: Trial 1: RUE- 2.0 seconds L half; 1.6 seconds R half (avg reaction time 1.8) Trial 2: LUE- 1.5 seconds L half; 2.1 seconds R half (average reaction time 1.8 seconds) Trial 3: RUE (red lights) average reaction time 2.4 seconds  LUE (green lights)- missed 1 stimulus- average reaction time 2.8seconds  Pt required min VC for recalling which hand to use for different lights  Pt completes seated and standing FMC activity of making no sew blanket using RUE to cut with scissors along marked lines in fabric with VC for larger opening and closing of hand against resistance of fabric. Pt able to tie knots in fabric to 'sew' the blanket together with VC for pulling knots tighter to improve pinch strength.   Therapy Documentation Precautions:  Precautions Precautions: Fall Precaution Comments: expressive aphasia Restrictions Weight Bearing Restrictions: No General:   Vital Signs:   Pain: Pain Assessment Pain Scale:  0-10 Pain Score: 0-No pain   Therapy/Group: Individual Therapy  Tonny Branch 07/17/2018, 3:07 PM

## 2018-07-18 ENCOUNTER — Inpatient Hospital Stay (HOSPITAL_COMMUNITY): Payer: PPO | Admitting: Occupational Therapy

## 2018-07-18 ENCOUNTER — Inpatient Hospital Stay (HOSPITAL_COMMUNITY): Payer: PPO | Admitting: Speech Pathology

## 2018-07-18 ENCOUNTER — Inpatient Hospital Stay (HOSPITAL_COMMUNITY): Payer: PPO | Admitting: Physical Therapy

## 2018-07-18 LAB — GLUCOSE, CAPILLARY
Glucose-Capillary: 94 mg/dL (ref 70–99)
Glucose-Capillary: 96 mg/dL (ref 70–99)
Glucose-Capillary: 105 mg/dL — ABNORMAL HIGH (ref 70–99)
Glucose-Capillary: 100 mg/dL — ABNORMAL HIGH (ref 70–99)
Glucose-Capillary: 110 mg/dL — ABNORMAL HIGH (ref 70–99)

## 2018-07-18 MED ORDER — PANTOPRAZOLE SODIUM 40 MG PO PACK
40.0000 mg | PACK | Freq: Every day | ORAL | Status: DC
  Administered 2018-07-18 – 2018-07-24 (×7): 40 mg via ORAL
  Filled 2018-07-18 (×6): qty 20

## 2018-07-18 NOTE — Progress Notes (Signed)
Patient ID: Kaitlyn Underwood, female   DOB: July 08, 1941, 77 y.o.   MRN: [id number]   Kaitlyn Underwood is a 77 y.o. female admitted for CIR with functional deficits following a left MCA infarction  Past Medical History:  Diagnosis Date  . Anxiety   . Atrial fibrillation ([place])   . Chronic systolic heart failure ([place])   . COPD (chronic obstructive pulmonary disease) ([place])   . Coronary artery disease   . Hypertension   . Hypothyroidism   . Left renal mass    unconfirmed documentation of "kidney cancer", possibly cyst  . Pre-diabetes      Subjective: No new complaints. No new problems. Slept well.  In good spirits  Objective: Vital signs in last 24 hours: Temp:  [97.5 F (36.4 C)-98.1 F (36.7 C)] 97.6 F (36.4 C) (11/30 0518) Pulse Rate:  [52-65] 62 (11/30 0518) Resp:  [16-20] 16 (11/30 0518) BP: (163-167)/(60-88) 163/60 (11/30 0518) SpO2:  [98 %-99 %] 99 % (11/30 0518) Weight change:  Last BM Date: 07/17/18  Intake/Output from previous day: 11/29 0701 - 11/30 0700 In: 360 [P.O.:360] Out: -  Last cbgs: CBG (last 3)  Recent Labs    07/17/18 2035 07/18/18 0648 07/18/18 0726  GLUCAP 97 94 96   Lab Results  Component Value Date   HGBA1C 5.6 07/06/2018    Patient Vitals for the past 24 hrs:  BP Temp Temp src Pulse Resp SpO2  07/18/18 0518 (!) 163/60 97.6 F (36.4 C) - 62 16 99 %  07/17/18 2116 - - - 65 18 99 %  07/17/18 2036 (!) 167/81 (!) 97.5 F (36.4 C) Oral 60 18 98 %  07/17/18 1635 (!) 165/88 98.1 F (36.7 C) Oral (!) 52 20 99 %     Physical Exam General: No apparent distress   HEENT: Unremarkable Lungs: Normal effort. Lungs clear to auscultation, no crackles or wheezes. Cardiovascular: Irregular rate and rhythm, no edema Abdomen: S/NT/ND; BS(+) Musculoskeletal:  unchanged Neurological: No new neurological deficits with right-sided weakness Wounds: N/A    Skin: clear   Mental state: Alert, oriented, cooperative    Lab Results: BMET    Component Value  Date/Time   NA 139 07/17/2018 0624   NA 141 05/12/2018 1212   K 4.7 07/17/2018 0624   CL 108 07/17/2018 0624   CO2 22 07/17/2018 0624   GLUCOSE 87 07/17/2018 0624   BUN 12 07/17/2018 0624   BUN 25 05/12/2018 1212   CREATININE 0.79 07/17/2018 0624   CALCIUM 9.1 07/17/2018 0624   GFRNONAA >60 07/17/2018 0624   GFRAA >60 07/17/2018 0624   CBC    Component Value Date/Time   WBC 10.2 07/13/2018 0807   RBC 3.77 (L) 07/13/2018 0807   HGB 11.6 (L) 07/13/2018 0807   HGB 13.7 05/12/2018 1212   HCT 36.3 07/13/2018 0807   HCT 40.8 05/12/2018 1212   PLT 412 (H) 07/13/2018 0807   PLT 414 05/12/2018 1212   MCV 96.3 07/13/2018 0807   MCV 94 05/12/2018 1212   MCH 30.8 07/13/2018 0807   MCHC 32.0 07/13/2018 0807   RDW 13.6 07/13/2018 0807   RDW 13.6 05/12/2018 1212   LYMPHSABS 2.3 07/13/2018 0807   LYMPHSABS 3.3 (H) 05/12/2018 1212   MONOABS 0.7 07/13/2018 0807   EOSABS 0.1 07/13/2018 0807   EOSABS 0.2 05/12/2018 1212   BASOSABS 0.0 07/13/2018 0807   BASOSABS 0.1 05/12/2018 1212    Medications: I have reviewed the patient's current medications.  Assessment/Plan:  Status  post left MCA infarction with functional deficits.  Continue CIR DVT prophylaxis.  Continue Eliquis Essential hypertension.  We will continue to monitor systolic readings.  Continue present regimen.  Toprol down titrated due to bradycardia Chronic atrial fibrillation.  Continue rate control and anticoagulation COPD stable Chronic systolic heart failure compensated Dyslipidemia.  Continue statin therapy  Length of stay, days: 8  Marletta Lor , MD 07/18/2018, 9:52 AM

## 2018-07-18 NOTE — Progress Notes (Signed)
Speech Language Pathology Daily Session Note  Patient Details  Name: Kaitlyn Underwood MRN: [id number] Date of Birth: May 31, 1941  Today's Date: 07/18/2018 SLP Individual Time: 0825-0920 SLP Individual Time Calculation (min): 55 min  Short Term Goals: Week 2: SLP Short Term Goal 1 (Week 2): Patient will utilize external aids to recall basic, daily information with min assist multimodal cues.   SLP Short Term Goal 2 (Week 2): Patient will recognize and correct verbal errors with min assist verbal cues.   SLP Short Term Goal 3 (Week 2): Patient will name basic, familiar objects for ~90% accuracy with supervision verbal cues.   SLP Short Term Goal 4 (Week 2): Patient will consume current diet with minimal overt s/s of aspiration and supervision verbal cues for use of swallowing compensatory strategies.  SLP Short Term Goal 5 (Week 2): Patient will consume thin liquids via cup with minimal overt s/s of aspiration over 2 sessions with supervision verbal cues prior to upgrade.   Skilled Therapeutic Interventions: Skilled treatment session focused on cognitive-linguistic goals. SLP facilitated session by providing extra time and Min A verbal cues for problem solving with 4 step picture sequencing cards and Mod A verbal cues which faded to Min A by end of session for word-finding and self-monitoring and correcting verbal errors due to phonemic paraphasias and perseveration while verbally describing the actions within the pictures at the phrase and sentence level. Patient requested to use the bathroom and required Mod-Max A verbal cues for problem solving and safety with task, especially in regards to use of RW. Patient left upright in wheelchair with alarm on and all needs within reach. Continue with current plan of care.      Pain No/Denies Pain   Therapy/Group: Individual Therapy  Traivon Morrical 07/18/2018, 12:42 PM

## 2018-07-18 NOTE — Progress Notes (Signed)
Occupational Therapy Session Note  Patient Details  Name: TERIN DIEROLF MRN: [id number] Date of Birth: 05-24-1941  Today's Date: 07/18/2018 OT Individual Time: 1139-1209 OT Individual Time Calculation (min): 30 min    Skilled Therapeutic Interventions/Progress Updates:patient participated in cognitive skills expressive communication, comprehension during neuro red education with her right UE and standing mildly dynamically at sink (Min A) x 2    Also stood at sink for standing tolerance while complete FM with righ hand and bilateral activities, as well as crossing midline, alternately reach for items on sink or paper towels.  Husband was present attending to his cell phone during session.   Patient as left seated in w/c with safety alarm fastened and call bell within reach.      Therapy Documentation Precautions:  Precautions Precautions: Fall Precaution Comments: expressive aphasia Restrictions Weight Bearing Restrictions: No  Pain:denied    Therapy/Group: Individual Therapy  Alfredia Ferguson [facility] 07/18/2018, 3:21 PM

## 2018-07-18 NOTE — Plan of Care (Signed)
  Problem: RH KNOWLEDGE DEFICIT Goal: RH STG INCREASE KNOWLEDGE OF DIABETES Description With Min Assistance  Outcome: Not Progressing; expressive aphasia   Problem: RH KNOWLEDGE DEFICIT Goal: RH STG INCREASE KNOWLEDGE OF HYPERTENSION Description With Min Assistance   Outcome: Not Progressing; expressive aphasia   Problem: RH KNOWLEDGE DEFICIT Goal: RH STG INCREASE KNOWLEDGE OF DYSPHAGIA/FLUID INTAKE Description With Mod I Assistance   Outcome: Not Progressing; needs coaching   Problem: RH KNOWLEDGE DEFICIT Goal: RH STG INCREASE KNOWLEGDE OF HYPERLIPIDEMIA Description With Min Assistance   Outcome: Not Progressing; expressive aphasia   Problem: RH KNOWLEDGE DEFICIT Goal: RH STG INCREASE KNOWLEDGE OF STROKE PROPHYLAXIS Description With Min Assistance   Outcome: Not Progressing; expressive aphasia

## 2018-07-18 NOTE — Progress Notes (Signed)
Occupational Therapy Session Note  Patient Details  Name: Kaitlyn Underwood MRN: [id number] Date of Birth: Aug 05, 1941  Today's Date: 07/18/2018 OT Individual Time: 1006-1103 OT Individual Time Calculation (min): 57 min   Short Term Goals: Week 1:  OT Short Term Goal 1 (Week 1): Pt's STGs are equal to LTGs set at supervision level.    Skilled Therapeutic Interventions/Progress Updates:    Pt greeted in w/c and agreeable to shower. Tx focus placed on Rt NMR, posture, functional transfers, and standing balance during bathing, dressing, and grooming tasks. All functional transfers completed with Min A using RW at ambulatory level. Manual and tactile cues provided for improving upright alignment. She bathed sit<stand level. Min vcs for scooting Rt hip back onto TTB to improve positioning/safety. Able to wash feet, with Min A required to stabilize R LE in figure 4 position. Steady assist for dynamic standing balance using grab bars for support. OT assisted with posterior perihygiene. Afterwards, dressing completed sit<stand at sink. Pt with Rt lateral lean and we used mirror to increase postural awareness. Assist required for placing B LEs in figure 4 position to don gripper socks. Total A for Teds. Clothing and grooming items were placed on Rt side to assess inattention. Pt required min vcs when locating socks. Question whether difficultly was due to not recognizing different colored gripper socks vs inattention. OT completed oral care using suction toothbrush and then pt washed hands with supervision. Pt remained up in w/c with all needs within reach, safety belt fastened, and spouse present.        Therapy Documentation Precautions:  Precautions Precautions: Fall Precaution Comments: expressive aphasia Restrictions Weight Bearing Restrictions: No Vital Signs: Oxygen Therapy O2 Device: Room Air Pain: Pt denied pain during session    ADL: ADL Eating: Supervision/safety Where Assessed-Eating:  Wheelchair Grooming: Supervision/safety Where Assessed-Grooming: Wheelchair Upper Body Bathing: Supervision/safety Where Assessed-Upper Body Bathing: Shower Lower Body Bathing: Contact guard Where Assessed-Lower Body Bathing: Shower Upper Body Dressing: Minimal assistance Where Assessed-Upper Body Dressing: Wheelchair Lower Body Dressing: Moderate assistance Where Assessed-Lower Body Dressing: Wheelchair Toileting: Minimal assistance Where Assessed-Toileting: Glass blower/designer: Moderate assistance Toilet Transfer Method: Stand pivot Toilet Transfer Equipment: Energy manager: Moderate assistance Social research officer, government Method: Radiographer, therapeutic: Grab bars, Transfer tub bench      Therapy/Group: Individual Therapy  Bryann Mcnealy A Jaidan Prevette 07/18/2018, 12:18 PM

## 2018-07-18 NOTE — Progress Notes (Signed)
Physical Therapy Session Note  Patient Details  Name: Kaitlyn Underwood MRN: [id number] Date of Birth: Apr 26, 1941  Today's Date: 07/18/2018 PT Individual Time: 1300-1345 PT Individual Time Calculation (min): 45 min   Short Term Goals: Week 2:  PT Short Term Goal 1 (Week 2): STG=LTG due to ELOS  Skilled Therapeutic Interventions/Progress Updates:    Pt received seated in w/c in room, agreeable to PT. No complaints of pain. Sit to stand with min A to RW. Ambulation 2 x 150 ft with RW and min A for balance, v/c to keep head upright during gait. Pt exhibits some impulsivity and attempts to seat before fulling in front of chair following gait. Pt also exhibits some confusion when turning around during gait and turns around in her RW instead of bringing RW with her when turning. Standing tolerance 2 x 5 min while scanning R visual field for cards and reaching with alt UE outside BOS for cards, min A and RW for balance. Pt requires max cueing to find correct card. Standing alt L/R 3" step-taps with BUE support on RW and min A for balance, v/c to keep head and trunk upright while standing. Pt left seated in w/c in room with needs in reach, quick release belt and chair alarm in place, family present.  Therapy Documentation Precautions:  Precautions Precautions: Fall Precaution Comments: expressive aphasia Restrictions Weight Bearing Restrictions: No   Therapy/Group: Individual Therapy  Excell Seltzer, PT, DPT  07/18/2018, 4:00 PM

## 2018-07-19 LAB — GLUCOSE, CAPILLARY
Glucose-Capillary: 97 mg/dL (ref 70–99)
Glucose-Capillary: 103 mg/dL — ABNORMAL HIGH (ref 70–99)
Glucose-Capillary: 95 mg/dL (ref 70–99)

## 2018-07-19 NOTE — Progress Notes (Signed)
Patient ID: Kaitlyn Underwood, female   DOB: 1940-08-23, 77 y.o.   MRN: [id number]   Kaitlyn Underwood is a 77 y.o. female who is admitted for CIR following a left MCA infarction with functional deficits  Past Medical History:  Diagnosis Date  . Anxiety   . Atrial fibrillation ([place])   . Chronic systolic heart failure ([place])   . COPD (chronic obstructive pulmonary disease) ([place])   . Coronary artery disease   . Hypertension   . Hypothyroidism   . Left renal mass    unconfirmed documentation of "kidney cancer", possibly cyst  . Pre-diabetes      Subjective: No new complaints. No new problems. Slept well.  Complaining of slightly increase in bowel frequency  Objective: Vital signs in last 24 hours: Temp:  [98.3 F (36.8 C)-98.5 F (36.9 C)] 98.5 F (36.9 C) (12/01 0520) Pulse Rate:  [56-72] 67 (12/01 0520) Resp:  [16-18] 16 (12/01 0520) BP: (154-160)/(70-72) 154/70 (12/01 0520) SpO2:  [96 %-98 %] 96 % (12/01 0520) Weight change:  Last BM Date: 07/17/18  Intake/Output from previous day: 11/30 0701 - 12/01 0700 In: 600 [P.O.:600] Out: -  Last cbgs: CBG (last 3)  Recent Labs    07/18/18 1622 07/18/18 2118 07/19/18 0641  GLUCAP 100* 110* 97   Patient Vitals for the past 24 hrs:  BP Temp Temp src Pulse Resp SpO2  07/19/18 0520 (!) 154/70 98.5 F (36.9 C) Oral 67 16 96 %  07/18/18 2039 - - - 72 16 98 %  07/18/18 1932 (!) 160/70 98.3 F (36.8 C) Oral 60 16 97 %  07/18/18 1426 (!) 160/72 98.5 F (36.9 C) Oral (!) 56 18 98 %     Physical Exam General: No apparent distress   HEENT: Clear Lungs: Normal effort. Lungs clear to auscultation, no crackles or wheezes. Cardiovascular: Irregular rate and rhythm, no edema grade 2/6 systolic murmur Abdomen: S/NT/ND; BS(+) Musculoskeletal:  unchanged Neurological: No new neurological deficits with mild right-sided weakness Wounds: N/A    Skin: clear   Mental state: Alert, oriented, cooperative    Lab Results: BMET    Component  Value Date/Time   NA 139 07/17/2018 0624   NA 141 05/12/2018 1212   K 4.7 07/17/2018 0624   CL 108 07/17/2018 0624   CO2 22 07/17/2018 0624   GLUCOSE 87 07/17/2018 0624   BUN 12 07/17/2018 0624   BUN 25 05/12/2018 1212   CREATININE 0.79 07/17/2018 0624   CALCIUM 9.1 07/17/2018 0624   GFRNONAA >60 07/17/2018 0624   GFRAA >60 07/17/2018 0624   CBC    Component Value Date/Time   WBC 10.2 07/13/2018 0807   RBC 3.77 (L) 07/13/2018 0807   HGB 11.6 (L) 07/13/2018 0807   HGB 13.7 05/12/2018 1212   HCT 36.3 07/13/2018 0807   HCT 40.8 05/12/2018 1212   PLT 412 (H) 07/13/2018 0807   PLT 414 05/12/2018 1212   MCV 96.3 07/13/2018 0807   MCV 94 05/12/2018 1212   MCH 30.8 07/13/2018 0807   MCHC 32.0 07/13/2018 0807   RDW 13.6 07/13/2018 0807   RDW 13.6 05/12/2018 1212   LYMPHSABS 2.3 07/13/2018 0807   LYMPHSABS 3.3 (H) 05/12/2018 1212   MONOABS 0.7 07/13/2018 0807   EOSABS 0.1 07/13/2018 0807   EOSABS 0.2 05/12/2018 1212   BASOSABS 0.0 07/13/2018 0807   BASOSABS 0.1 05/12/2018 1212   Lab Results  Component Value Date   HGBA1C 5.6 07/06/2018    Medications: I have  reviewed the patient's current medications.  Assessment/Plan:  Status post left MCA infarction with functional deficits secondary to right hemiparesis.  Continue CIR Chronic atrial fibrillation.  Continue anticoagulation and rate control Essential hypertension stable.  No change in medical regimen Chronic systolic heart failure.  Remains compensated COPD stable Dyslipidemia continue statin therapy    Length of stay, days: 9  Marletta Lor , MD 07/19/2018, 9:48 AM

## 2018-07-20 ENCOUNTER — Inpatient Hospital Stay (HOSPITAL_COMMUNITY): Payer: Self-pay | Admitting: Occupational Therapy

## 2018-07-20 ENCOUNTER — Inpatient Hospital Stay (HOSPITAL_COMMUNITY): Payer: PPO

## 2018-07-20 ENCOUNTER — Inpatient Hospital Stay (HOSPITAL_COMMUNITY): Payer: Self-pay

## 2018-07-20 ENCOUNTER — Inpatient Hospital Stay (HOSPITAL_COMMUNITY): Payer: PPO | Admitting: Occupational Therapy

## 2018-07-20 LAB — CBC WITH DIFFERENTIAL/PLATELET
ABS IMMATURE GRANULOCYTES: 0.03 10*3/uL (ref 0.00–0.07)
Basophils Absolute: 0 10*3/uL (ref 0.0–0.1)
Basophils Relative: 1 %
Eosinophils Absolute: 0.1 10*3/uL (ref 0.0–0.5)
Eosinophils Relative: 1 %
HCT: 39 % (ref 36.0–46.0)
Hemoglobin: 12.5 g/dL (ref 12.0–15.0)
Immature Granulocytes: 0 %
Lymphocytes Relative: 23 %
Lymphs Abs: 1.9 10*3/uL (ref 0.7–4.0)
MCH: 30.3 pg (ref 26.0–34.0)
MCHC: 32.1 g/dL (ref 30.0–36.0)
MCV: 94.4 fL (ref 80.0–100.0)
Monocytes Absolute: 0.6 10*3/uL (ref 0.1–1.0)
Monocytes Relative: 7 %
NEUTROS ABS: 5.8 10*3/uL (ref 1.7–7.7)
Neutrophils Relative %: 68 %
Platelets: 466 10*3/uL — ABNORMAL HIGH (ref 150–400)
RBC: 4.13 MIL/uL (ref 3.87–5.11)
RDW: 13.2 % (ref 11.5–15.5)
WBC: 8.4 10*3/uL (ref 4.0–10.5)
nRBC: 0 % (ref 0.0–0.2)

## 2018-07-20 LAB — URINALYSIS, ROUTINE W REFLEX MICROSCOPIC
Bilirubin Urine: NEGATIVE
GLUCOSE, UA: NEGATIVE mg/dL
Hgb urine dipstick: NEGATIVE
Ketones, ur: NEGATIVE mg/dL
Nitrite: POSITIVE — AB
PH: 7 (ref 5.0–8.0)
Protein, ur: NEGATIVE mg/dL
Specific Gravity, Urine: 1.014 (ref 1.005–1.030)

## 2018-07-20 LAB — BASIC METABOLIC PANEL
Anion gap: 8 (ref 5–15)
BUN: 10 mg/dL (ref 8–23)
CO2: 29 mmol/L (ref 22–32)
Calcium: 9.6 mg/dL (ref 8.9–10.3)
Chloride: 104 mmol/L (ref 98–111)
Creatinine, Ser: 0.92 mg/dL (ref 0.44–1.00)
GFR calc Af Amer: >60 mL/min (ref >60)
GFR calc non Af Amer: >60 mL/min (ref >60)
Glucose, Bld: 133 mg/dL — ABNORMAL HIGH (ref 70–99)
Potassium: 4.1 mmol/L (ref 3.5–5.1)
Sodium: 141 mmol/L (ref 135–145)

## 2018-07-20 LAB — GLUCOSE, CAPILLARY
GLUCOSE-CAPILLARY: 101 mg/dL — AB (ref 70–99)
Glucose-Capillary: 117 mg/dL — ABNORMAL HIGH (ref 70–99)
Glucose-Capillary: 91 mg/dL (ref 70–99)
Glucose-Capillary: 104 mg/dL — ABNORMAL HIGH (ref 70–99)
Glucose-Capillary: 105 mg/dL — ABNORMAL HIGH (ref 70–99)

## 2018-07-20 MED ORDER — CAPTOPRIL 12.5 MG PO TABS
12.5000 mg | ORAL_TABLET | Freq: Two times a day (BID) | ORAL | Status: DC
  Administered 2018-07-20 – 2018-07-22 (×6): 12.5 mg via ORAL
  Filled 2018-07-20 (×8): qty 1

## 2018-07-20 MED ORDER — CEPHALEXIN 250 MG PO CAPS
250.0000 mg | ORAL_CAPSULE | Freq: Three times a day (TID) | ORAL | Status: DC
  Administered 2018-07-20 – 2018-07-24 (×13): 250 mg via ORAL
  Filled 2018-07-20 (×13): qty 1

## 2018-07-20 NOTE — Progress Notes (Signed)
Speech Language Pathology Daily Session Note  Patient Details  Name: Kaitlyn Underwood MRN: [id number] Date of Birth: 30-Jun-1941  Today's Date: 07/20/2018 SLP Individual Time: 1345-1445 SLP Individual Time Calculation (min): 60 min  Short Term Goals: Week 2: SLP Short Term Goal 1 (Week 2): Patient will utilize external aids to recall basic, daily information with min assist multimodal cues.   SLP Short Term Goal 2 (Week 2): Patient will recognize and correct verbal errors with min assist verbal cues.   SLP Short Term Goal 3 (Week 2): Patient will name basic, familiar objects for ~90% accuracy with supervision verbal cues.   SLP Short Term Goal 4 (Week 2): Patient will consume current diet with minimal overt s/s of aspiration and supervision verbal cues for use of swallowing compensatory strategies.  SLP Short Term Goal 5 (Week 2): Patient will consume thin liquids via cup with minimal overt s/s of aspiration over 2 sessions with supervision verbal cues prior to upgrade.   Skilled Therapeutic Interventions:Skilled ST services focused on swallow and speech skills. SLP facilitated oral care piror to PO consumption, pt required mod  A fade to min A verbal cues to utilize double swallow/multimple swallow during 4oz thin liquid via cup trials, pt demonstrated 1 delayed cough. SLP initiated water protocol and provided education. SLP facilitated use of word finding skills at sentence level given picture cards (simple sequencing cards) pt required mod-min A semantic and phonemic cues for word finding and mod-min A verbal cues for error awareness and correction. Pt's husband entered room and SLP provided education on water protocol, all questions answered to satisfaction. Pt was left in room with call bell within reach and chair alarm set. SLP reccomends to continue skilled services.     Pain Pain Assessment Pain Score: 0-No pain  Therapy/Group: Individual Therapy  [name]  [facility] 07/20/2018, 2:50 PM

## 2018-07-20 NOTE — Progress Notes (Signed)
Physical Therapy Session Note  Patient Details  Name: Kaitlyn Underwood MRN: [id number] Date of Birth: 1940-11-16  Today's Date: 07/20/2018 PT Individual Time: 1000-1100 PT Individual Time Calculation (min): 60 min   Short Term Goals: Week 2:  PT Short Term Goal 1 (Week 2): STG=LTG due to ELOS  Skilled Therapeutic Interventions/Progress Updates:    Pt seated in w/c upon PT arrival, agreeable to therapy tx and denies pain. Pt donned pants in sitting, sit<>stand with min assist and RW to pull pants over hips. Pt dons shirt with supervision. Pt transported to the dayroom. W/c<>nustep transfer with CGA. Pt used nustep x 6 minutes on workload 4 for global strengthening and endurance. Pt ambulated 3 x 80 ft this session with RW and CGA working on endurance and activity tolerance, verbal cues for upright posture and decreased UE reliance. Pt performed 2 x 10 sit<>stands this session pushing from mat with B UEs, working on LE strengthening. Pt worked on standing balance without UE support to perform card matching activity working on standing endurance, min assist for balance, fatigues quickly. Pt transferred back to w/c CGA stand pivot and transported to bathroom. Stand pivot from w/c<>toilet with CGA, verbal cues for techniques, continent of bladder. Pt left sitting in w/c at end of session with needs in reach and chair alarm set.   Therapy Documentation Precautions:  Precautions Precautions: Fall Precaution Comments: expressive aphasia Restrictions Weight Bearing Restrictions: No    Therapy/Group: Individual Therapy  Netta Corrigan, PT, DPT 07/20/2018, 7:49 AM

## 2018-07-20 NOTE — Progress Notes (Addendum)
[place] PHYSICAL MEDICINE & REHABILITATION PROGRESS NOTE   Subjective/Complaints:  Pt talking about a drug store and aneurysm, orient to "[facility] but then corrected to [facility]"  ROS  Neg for CP, SOB, N/V/D, or body aches or pains  Objective:   No results found. No results for input(s): WBC, HGB, HCT, PLT in the last 72 hours. No results for input(s): NA, K, CL, CO2, GLUCOSE, BUN, CREATININE, CALCIUM in the last 72 hours.  Intake/Output Summary (Last 24 hours) at 07/20/2018 0759 Last data filed at 07/19/2018 1657 Gross per 24 hour  Intake 240 ml  Output -  Net 240 ml     Physical Exam: Vital Signs Blood pressure (!) 158/79, pulse 60, temperature 97.6 F (36.4 C), temperature source Oral, resp. rate 18, height 5\' 2"  (1.575 m), weight 42 kg, SpO2 96 %.   General: No acute distress Mood and affect are appropriate Heart: Regular rate and rhythm no rubs murmurs or extra sounds Lungs: Clear to auscultation, breathing unlabored, +wheezes bilaterally Abdomen: Positive bowel sounds, soft nontender to palpation, nondistended Extremities: No clubbing, cyanosis, or edema Skin: No evidence of breakdown, no evidence of rash Neurologic: Cranial nerves II through XII intact, motor strength is 5/5 in Left and 4/5 on Right  deltoid, bicep, tricep, grip, hip flexor, knee extensors, ankle dorsiflexor and plantar flexor Sensory exam normal sensation to light touch and proprioception in bilateral upper and lower extremities Cerebellar exam normal finger to nose to finger as well as heel to shin in bilateral upper and lower extremities Musculoskeletal: Full range of motion in all 4 extremities. No joint swelling   Assessment/Plan: 1. Functional deficits secondary to Left MCA infarct  which require 3+ hours per day of interdisciplinary therapy in a comprehensive inpatient rehab setting.  Physiatrist is providing close team supervision and 24 hour management of active medical problems  listed below.  Physiatrist and rehab team continue to assess barriers to discharge/monitor patient progress toward functional and medical goals  Care Tool:  Bathing    Body parts bathed by patient: Right arm, Left arm, Chest, Abdomen, Right upper leg, Left upper leg, Right lower leg, Left lower leg, Face, Front perineal area   Body parts bathed by helper: Buttocks Body parts n/a: Buttocks, Front perineal area(did not attempt secondary to female therapist)   Bathing assist Assist Level: Minimal Assistance - Patient > 75%     Upper Body Dressing/Undressing Upper body dressing   What is the patient wearing?: Pull over shirt    Upper body assist Assist Level: Supervision/Verbal cueing    Lower Body Dressing/Undressing Lower body dressing      What is the patient wearing?: Incontinence brief, Pants     Lower body assist Assist for lower body dressing: Moderate Assistance - Patient 50 - 74%     Toileting Toileting    Toileting assist Assist for toileting: Moderate Assistance - Patient 50 - 74%     Transfers Chair/bed transfer  Transfers assist  Chair/bed transfer activity did not occur: Safety/medical concerns  Chair/bed transfer assist level: Minimal Assistance - Patient > 75%     Locomotion Ambulation   Ambulation assist      Assist level: Minimal Assistance - Patient > 75% Assistive device: Walker-rolling Max distance: 150 ft   Walk 10 feet activity   Assist     Assist level: Minimal Assistance - Patient > 75% Assistive device: Walker-rolling   Walk 50 feet activity   Assist    Assist level: Minimal Assistance -  Patient > 75% Assistive device: Walker-rolling    Walk 150 feet activity   Assist Walk 150 feet activity did not occur: Safety/medical concerns  Assist level: Minimal Assistance - Patient > 75% Assistive device: Walker-rolling    Walk 10 feet on uneven surface  activity   Assist Walk 10 feet on uneven surfaces activity did  not occur: Safety/medical concerns         Wheelchair     Assist   Type of Wheelchair: Manual    Wheelchair assist level: Contact Guard/Touching assist Max wheelchair distance: 50    Wheelchair 50 feet with 2 turns activity    Assist        Assist Level: Contact Guard/Touching assist   Wheelchair 150 feet activity     Assist Wheelchair 150 feet activity did not occur: Safety/medical concerns        Medical Problem List and Plan: 1.Right side weakness with facial droop and aphasiasecondary to left MCA infarction tent d/c 12/7- Cont CIR PT, OT, SLP 2. DVT Prophylaxis/Anticoagulation: Eliquis 3. Pain Management:Tylenol as needed 4. Mood:Zoloft 25 mg daily 5. Neuropsych: This patientiscapable of making decisions on herown behalf. 6. Skin/Wound Care:Routine skin checks 7. Fluids/Electrolytes/Nutrition:I 652ml on 11/28BMET normal 11/29, hold off on IV fluid supplementation    8. Dysphagia. Dysphagia #1 pudding thick liquids. Upgrade to D2 nectar on 11/27 Monitor hydration. Follow-up speech therapy I 565ml yesterday- recheck BMET  9. Hypertension. Toprol 25mg , HR running low will reduce Vitals:   07/20/18 0429 07/20/18 0741  BP: (!) 158/79   Pulse: 60   Resp: 18   Temp: 97.6 F (36.4 C)   SpO2: 97% 96%  BP elevated sys 12/2, restart captopril but at low dose 10. Atrial fibrillation. Lanoxin 0.125 mg daily. Cardiac rate controlled- check dig level 11.COPD.pt states she has not received inhaler this am - on Singulair 10mg  qhs, Pulmicort .5 BID neb, prn albuterol  12.Chronic systolic congestive heart failure.Monitor for any signs of fluid overload, no peripheral edema or rales  -daily weights ordered, check CXR given wheezes and IVF at noc, CXR shows no pulm edema, also has no LE edema 13. Hyperlipidemia. Pravachol 14. Pre-diabetes. Hemoglobin A1c 5.6. SSI  15.  Appears more confused today likely UTI, afebrile, + UA  Cx pnd but no new focal neuro deficits- check CBC, BMET- start keflex pnd cx  LOS: 10 days A FACE TO FACE EVALUATION WAS PERFORMED  Charlett Blake 07/20/2018, 7:59 AM

## 2018-07-20 NOTE — Progress Notes (Signed)
Occupational Therapy Session Note  Patient Details  Name: Kaitlyn Underwood MRN: [id number] Date of Birth: 1940/12/18  Today's Date: 07/20/2018 OT Individual Time: 0930-1000 OT Individual Time Calculation (min): 30 min    Short Term Goals: Week 1:  OT Short Term Goal 1 (Week 1): Pt's STGs are equal to LTGs set at supervision level.    Skilled Therapeutic Interventions/Progress Updates:    Upon entering the room, pt supine in bed with no c/o pain this session. Pt verbalized need to void. Pt performed bed mobility with min guard for supine >sit and standing from bed with min A and use of RW. Pt ambulating with RW and min A 15' into bathroom with min guard for safety with balance. Pt required min cuing for sequencing to doff clothing prior to sitting on toilet. Pt able to void and performed hygiene and clothing management with min A standing balance. Pt returning to sit in wheelchair at sink for grooming tasks with set up A to obtain all needed items and focus on use of R UE for tasks. Pt remained seated in wheelchair with chair alarm activated and call bell within reach.   Therapy Documentation Precautions:  Precautions Precautions: Fall Precaution Comments: expressive aphasia Restrictions Weight Bearing Restrictions: No Vital Signs: Oxygen Therapy SpO2: 96 % O2 Device: Room Air Pain: Pain Assessment Pain Scale: 0-10 Pain Score: 0-No pain ADL: ADL Eating: Supervision/safety Where Assessed-Eating: Wheelchair Grooming: Supervision/safety Where Assessed-Grooming: Wheelchair Upper Body Bathing: Supervision/safety Where Assessed-Upper Body Bathing: Shower Lower Body Bathing: Contact guard Where Assessed-Lower Body Bathing: Shower Upper Body Dressing: Minimal assistance Where Assessed-Upper Body Dressing: Wheelchair Lower Body Dressing: Moderate assistance Where Assessed-Lower Body Dressing: Wheelchair Toileting: Minimal assistance Where Assessed-Toileting: Glass blower/designer:  Moderate assistance Toilet Transfer Method: Stand pivot Toilet Transfer Equipment: Energy manager: Moderate assistance Social research officer, government Method: Radiographer, therapeutic: Grab bars, Transfer tub bench   Therapy/Group: Individual Therapy  Gypsy Decant 07/20/2018, 10:31 AM

## 2018-07-20 NOTE — Progress Notes (Signed)
Occupational Therapy Session Note  Patient Details  Name: Kaitlyn Underwood MRN: [id number] Date of Birth: 1941-06-30  Today's Date: 07/20/2018 OT Individual Time: 1300-1345 OT Individual Time Calculation (min): 45 min    Short Term Goals: Week 1:  OT Short Term Goal 1 (Week 1): Pt's STGs are equal to LTGs set at supervision level.    Skilled Therapeutic Interventions/Progress Updates:    Pt seen for OT session focusing on ADL re-training and functional activity tolerance. Pt received with hand of from RN having just finished toileting task. PT agreeable to tx session. Completed LB dressing seated in w/c, stood at sink with close supervision to pull pants up, heavy reliance on UEs for balance and upright support. Followning seated rest break, complete grooming tasks in standing in order to address functional activity tolerance. Pt requiring seated rest breaks btwn trials. Pt requiring VCs to recall not swallowing of water during oral care task due to modified diet. Difficulty verbalizing set-up needs due to expressive aphasia.  Pt returned to w/c and taken to therapy day room total A in w/c for time and energy conservation. Standing at high/low table, complete moderately complex pattern replication utilizing peg board. Pt requiring overall mod A for activity, unable to self recognize error and max cuing to correct error when made aware of it. She was able to correctly verbalize colors of pegs with slightly increased time. Pt returned to room at end of session, left seated in w/c with chair alarm belt donned and all needs in reach.    Therapy Documentation Precautions:  Precautions Precautions: Fall Precaution Comments: expressive aphasia Restrictions Weight Bearing Restrictions: No Pain: Pain Assessment Pain Scale: 0-10 Pain Score: 0-No pain   Therapy/Group: Individual Therapy  Lenoria Narine L 07/20/2018, 12:09 PM

## 2018-07-21 ENCOUNTER — Inpatient Hospital Stay (HOSPITAL_COMMUNITY): Payer: PPO | Admitting: Speech Pathology

## 2018-07-21 ENCOUNTER — Inpatient Hospital Stay (HOSPITAL_COMMUNITY): Payer: Self-pay | Admitting: Physical Therapy

## 2018-07-21 ENCOUNTER — Inpatient Hospital Stay (HOSPITAL_COMMUNITY): Payer: Self-pay

## 2018-07-21 ENCOUNTER — Inpatient Hospital Stay (HOSPITAL_COMMUNITY): Payer: PPO | Admitting: Occupational Therapy

## 2018-07-21 LAB — GLUCOSE, CAPILLARY
Glucose-Capillary: 96 mg/dL (ref 70–99)
Glucose-Capillary: 115 mg/dL — ABNORMAL HIGH (ref 70–99)
Glucose-Capillary: 103 mg/dL — ABNORMAL HIGH (ref 70–99)
Glucose-Capillary: 111 mg/dL — ABNORMAL HIGH (ref 70–99)

## 2018-07-21 LAB — URINE CULTURE

## 2018-07-21 NOTE — Progress Notes (Signed)
Occupational Therapy Weekly Progress Note  Patient Details  Name: Kaitlyn Underwood MRN: [id number] Date of Birth: 1940/10/28  Beginning of progress report period: July 11, 2018 End of progress report period: July 21, 2018     Patient has met 0 of 12 long term goals.  Short term goals not set due to estimated length of stay.  Pt is close to meeting S goals as she is at a CGA/ min A level now with self care and a min/mod A level with transfers.  Patient continues to demonstrate the following deficits: muscle weakness, decreased cardiorespiratoy endurance, decreased coordination and decreased motor planning, decreased attention to right, decreased memory and delayed processing and decreased standing balance, decreased postural control, hemiplegia and decreased balance strategies and therefore will continue to benefit from skilled OT intervention to enhance overall performance with BADL.  See Patient's Care Plan for progression toward long term goals.  Patient progressing toward long term goals..  Continue plan of care.  Therapy Documentation Precautions:  Precautions Precautions: Fall Precaution Comments: expressive aphasia Restrictions Weight Bearing Restrictions: No    Vital Signs: Therapy Vitals Temp: 98.3 F (36.8 C) Temp Source: Oral Pulse Rate: 84 Resp: 18 BP: (!) 154/76 Patient Position (if appropriate): Lying Oxygen Therapy SpO2: 99 % O2 Device: Room Air    ADL: ADL Eating: Supervision/safety Where Assessed-Eating: Wheelchair Grooming: Supervision/safety Where Assessed-Grooming: Clinical biochemist Bathing: Supervision/safety Where Assessed-Upper Body Bathing: Shower Lower Body Bathing: Contact guard Where Assessed-Lower Body Bathing: Shower Upper Body Dressing: Minimal assistance Where Assessed-Upper Body Dressing: Wheelchair Lower Body Dressing: Moderate assistance Where Assessed-Lower Body Dressing: Wheelchair Toileting: Minimal assistance Where  Assessed-Toileting: Glass blower/designer: Moderate assistance Toilet Transfer Method: Stand pivot Toilet Transfer Equipment: Energy manager: Moderate assistance Social research officer, government Method: Radiographer, therapeutic: Grab bars, Transfer tub bench     [place] 07/21/2018, 8:26 AM

## 2018-07-21 NOTE — Progress Notes (Signed)
Occupational Therapy Session Note  Patient Details  Name: Kaitlyn Underwood MRN: [id number] Date of Birth: 1941-04-12  Today's Date: 07/21/2018 OT Individual Time: 1045-1130 OT Individual Time Calculation (min): 45 min    Short Term Goals: Week 1:  OT Short Term Goal 1 (Week 1): Pt's STGs are equal to LTGs set at supervision level.       Skilled Therapeutic Interventions/Progress Updates:      Pt seen for BADL retraining of toileting, bathing, and dressing with a focus on balance, motor planning/ sequencing.  Pt received in bed and worked on bed mobility with S, sit to stand from EOB with CGA,  Pt stepped on scale to get weight for RN report and then used RW to ambulate to bathroom.  With ambulation in small areas and with transfers, pt was needing mod A to guide the walker, to guide pt to move closer to sitting surface before sitting, to reach back.  Pt seemed to have motor planning challenges with figuring out how to turn the walker to get around in a 180 angle and she tended to start to sit before she was adequately positioned in front of the seat.  See ADL documentation details below.   Pt completed self care and then rested in w/c with chair belt alarm on and all needs met.  Therapy Documentation Precautions:  Precautions Precautions: Fall Precaution Comments: expressive aphasia Restrictions Weight Bearing Restrictions: No    Vital Signs: Oxygen Therapy SpO2: 98 % O2 Device: Room Air Pain: Pain Assessment Pain Score: 0-No pain ADL: ADL Eating: Supervision/safety Where Assessed-Eating: Wheelchair Grooming: Supervision/safety Where Assessed-Grooming: Wheelchair Upper Body Bathing: Supervision/safety Where Assessed-Upper Body Bathing: Shower Lower Body Bathing: Contact guard Where Assessed-Lower Body Bathing: Shower Upper Body Dressing: Supervision/safety Where Assessed-Upper Body Dressing: Chair Lower Body Dressing: Minimal assistance Where Assessed-Lower Body Dressing:  Chair Toileting: Minimal assistance Where Assessed-Toileting: Glass blower/designer: Moderate assistance Toilet Transfer Method: Counselling psychologist: Energy manager: Moderate assistance Social research officer, government Method: Radiographer, therapeutic: Grab bars, Transfer tub bench      Therapy/Group: Individual Therapy  [place] 07/21/2018, 1:02 PM

## 2018-07-21 NOTE — Plan of Care (Signed)
  Problem: Consults Goal: RH STROKE PATIENT EDUCATION Description See Patient Education module for education specifics  Outcome: Progressing Goal: Nutrition Consult-if indicated Outcome: Progressing Goal: Diabetes Guidelines if Diabetic/Glucose > 140 Description If diabetic or lab glucose is > 140 mg/dl - Initiate Diabetes/Hyperglycemia Guidelines & Document Interventions  Outcome: Progressing   Problem: RH BOWEL ELIMINATION Goal: RH STG MANAGE BOWEL WITH ASSISTANCE Description STG Manage Bowel with Mod I Assistance.  Outcome: Progressing Goal: RH STG MANAGE BOWEL W/MEDICATION W/ASSISTANCE Description STG Manage Bowel with Medication with Mod I Assistance.  Outcome: Progressing   Problem: RH BLADDER ELIMINATION Goal: RH STG MANAGE BLADDER WITH ASSISTANCE Description STG Manage Bladder With Mod I Assistance  Outcome: Progressing Goal: RH STG MANAGE BLADDER WITH EQUIPMENT WITH ASSISTANCE Description STG Manage Bladder With Equipment With Mod I Assistance  Outcome: Progressing   Problem: RH SAFETY Goal: RH STG ADHERE TO SAFETY PRECAUTIONS W/ASSISTANCE/DEVICE Description STG Adhere to Safety Precautions With Mod I Assistance/Device.  Outcome: Progressing Goal: RH STG DECREASED RISK OF FALL WITH ASSISTANCE Description STG Decreased Risk of Fall With Mod I Assistance.  Outcome: Progressing   Problem: RH KNOWLEDGE DEFICIT Goal: RH STG INCREASE KNOWLEDGE OF DIABETES Description With Min Assistance  Outcome: Progressing Goal: RH STG INCREASE KNOWLEDGE OF HYPERTENSION Description With Min Assistance   Outcome: Progressing Goal: RH STG INCREASE KNOWLEDGE OF DYSPHAGIA/FLUID INTAKE Description With Mod I Assistance   Outcome: Progressing Goal: RH STG INCREASE KNOWLEGDE OF HYPERLIPIDEMIA Description With Min Assistance   Outcome: Progressing Goal: RH STG INCREASE KNOWLEDGE OF STROKE PROPHYLAXIS Description With Min Assistance   Outcome: Progressing   Problem: RH  KNOWLEDGE DEFICIT Goal: RH STG INCREASE KNOWLEDGE OF DIABETES Description With Min Assistance  Outcome: Progressing Goal: RH STG INCREASE KNOWLEDGE OF HYPERTENSION Description With Min Assistance   Outcome: Progressing Goal: RH STG INCREASE KNOWLEDGE OF DYSPHAGIA/FLUID INTAKE Description With Mod I Assistance   Outcome: Progressing Goal: RH STG INCREASE KNOWLEGDE OF HYPERLIPIDEMIA Description With Min Assistance   Outcome: Progressing Goal: RH STG INCREASE KNOWLEDGE OF STROKE PROPHYLAXIS Description With Min Assistance   Outcome: Progressing

## 2018-07-21 NOTE — Progress Notes (Signed)
[place] PHYSICAL MEDICINE & REHABILITATION PROGRESS NOTE   Subjective/Complaints:  Pt oriented to [facility], participated in PT  Despite increased confusion yesterday  ROS  Neg for CP, SOB, N/V/D, or body aches or pains  Objective:   No results found. Recent Labs    07/20/18 0856  WBC 8.4  HGB 12.5  HCT 39.0  PLT 466*   Recent Labs    07/20/18 0856  NA 141  K 4.1  CL 104  CO2 29  GLUCOSE 133*  BUN 10  CREATININE 0.92  CALCIUM 9.6    Intake/Output Summary (Last 24 hours) at 07/21/2018 0823 Last data filed at 07/20/2018 1654 Gross per 24 hour  Intake 200 ml  Output -  Net 200 ml     Physical Exam: Vital Signs Blood pressure (!) 154/76, pulse 84, temperature 98.3 F (36.8 C), temperature source Oral, resp. rate 18, height 5\' 2"  (1.575 m), weight 42.3 kg, SpO2 99 %.   General: No acute distress Mood and affect are appropriate Heart: Regular rate and rhythm no rubs murmurs or extra sounds Lungs: Clear to auscultation, breathing unlabored, +wheezes bilaterally Abdomen: Positive bowel sounds, soft nontender to palpation, nondistended Extremities: No clubbing, cyanosis, or edema Skin: No evidence of breakdown, no evidence of rash Neurologic: Cranial nerves II through XII intact, motor strength is 5/5 in Left and 4/5 on Right  deltoid, bicep, tricep, grip, hip flexor, knee extensors, ankle dorsiflexor and plantar flexor Sensory exam normal sensation to light touch and proprioception in bilateral upper and lower extremities Cerebellar exam normal finger to nose to finger as well as heel to shin in bilateral upper and lower extremities Musculoskeletal: Full range of motion in all 4 extremities. No joint swelling   Assessment/Plan: 1. Functional deficits secondary to Left MCA infarct  which require 3+ hours per day of interdisciplinary therapy in a comprehensive inpatient rehab setting.  Physiatrist is providing close team supervision and 24 hour management of  active medical problems listed below.  Physiatrist and rehab team continue to assess barriers to discharge/monitor patient progress toward functional and medical goals  Care Tool:  Bathing    Body parts bathed by patient: Right arm, Left arm, Chest, Abdomen, Right upper leg, Left upper leg, Right lower leg, Left lower leg, Face, Front perineal area   Body parts bathed by helper: Buttocks Body parts n/a: Buttocks, Front perineal area(did not attempt secondary to female therapist)   Bathing assist Assist Level: Minimal Assistance - Patient > 75%     Upper Body Dressing/Undressing Upper body dressing   What is the patient wearing?: Pull over shirt    Upper body assist Assist Level: Supervision/Verbal cueing    Lower Body Dressing/Undressing Lower body dressing      What is the patient wearing?: Incontinence brief, Pants     Lower body assist Assist for lower body dressing: Moderate Assistance - Patient 50 - 74%     Toileting Toileting    Toileting assist Assist for toileting: Moderate Assistance - Patient 50 - 74%     Transfers Chair/bed transfer  Transfers assist  Chair/bed transfer activity did not occur: Safety/medical concerns  Chair/bed transfer assist level: Minimal Assistance - Patient > 75%     Locomotion Ambulation   Ambulation assist      Assist level: Contact Guard/Touching assist Assistive device: Walker-rolling Max distance: 80 ft   Walk 10 feet activity   Assist     Assist level: Contact Guard/Touching assist Assistive device: Walker-rolling  Walk 50 feet activity   Assist    Assist level: Contact Guard/Touching assist Assistive device: Walker-rolling    Walk 150 feet activity   Assist Walk 150 feet activity did not occur: Safety/medical concerns  Assist level: Minimal Assistance - Patient > 75% Assistive device: Walker-rolling    Walk 10 feet on uneven surface  activity   Assist Walk 10 feet on uneven surfaces  activity did not occur: Safety/medical concerns         Wheelchair     Assist   Type of Wheelchair: Manual    Wheelchair assist level: Contact Guard/Touching assist Max wheelchair distance: 50    Wheelchair 50 feet with 2 turns activity    Assist        Assist Level: Contact Guard/Touching assist   Wheelchair 150 feet activity     Assist Wheelchair 150 feet activity did not occur: Safety/medical concerns        Medical Problem List and Plan: 1.Right side weakness with facial droop and aphasiasecondary to left MCA infarction team conf in am Cont CIR PT, OT, SLP 2. DVT Prophylaxis/Anticoagulation: Eliquis 3. Pain Management:Tylenol as needed 4. Mood:Zoloft 25 mg daily 5. Neuropsych: This patientiscapable of making decisions on herown behalf. 6. Skin/Wound Care:Routine skin checks 7. Fluids/Electrolytes/Nutrition:I 638ml on 11/28BMET normal 11/29, hold off on IV fluid supplementation    8. Dysphagia. Dysphagia #1 pudding thick liquids. Upgrade to D2 nectar on 11/27 Monitor hydration. Follow-up speech therapy I 543ml yesterday- recheck BMET  9. Hypertension. Toprol 25mg , HR running low will reduce Vitals:   07/21/18 0502 07/21/18 0804  BP: (!) 154/76   Pulse: 64 84  Resp: 18   Temp: 98.3 F (36.8 C)   SpO2: 99%   BP elevated sys 12/2, restart captopril but at low dose, monitor effect 10. Atrial fibrillation. Lanoxin 0.125 mg daily. Cardiac rate controlled- check dig level 11.COPD.pt states she has not received inhaler this am - on Singulair 10mg  qhs, Pulmicort .5 BID neb, prn albuterol  12.Chronic systolic congestive heart failure.Monitor for any signs of fluid overload, no peripheral edema or rales  -daily weights ordered, check CXR given wheezes and IVF at noc, CXR shows no pulm edema, also has no LE edema 13. Hyperlipidemia. Pravachol 14. Pre-diabetes. Hemoglobin A1c 5.6. SSI  15.  Appears more confused today  likely UTI, afebrile, + UA Cx pnd but no new focal neuro deficits- check CBC, BMET- start keflex pnd cx- multiple organisms , MS appears to be clearing on Keflex  LOS: 11 days A FACE TO FACE EVALUATION WAS PERFORMED  Charlett Blake 07/21/2018, 8:23 AM

## 2018-07-21 NOTE — Progress Notes (Signed)
Physical Therapy Session Note  Patient Details  Name: Kaitlyn Underwood MRN: [id number] Date of Birth: 02-05-1941  Today's Date: 07/21/2018 PT Individual Time: 0800-0855 PT Individual Time Calculation (min): 55 min   Short Term Goals: Week 2:  PT Short Term Goal 1 (Week 2): STG=LTG due to ELOS  Skilled Therapeutic Interventions/Progress Updates:   Pt in supine and agreeable to therapy, denies pain. Transferred to EOB w/ supervision and donned shirt w/ supervision, pants w/ min assist. Verbal and visual cues for technique w/ dressing. Min-mod assist for balance while pulling pants over hips. Total assist w/c transport to/from therapy gym. Worked on standing balance and standing tolerance while performing card matching and peg board tasks. Emphasis on maintaining balance w/ unilateral UE support fading to no UE support. Pt very fearful to take both UEs off RW or table, max encouragement and min-mod assist for balance when doing this, otherwise CGA-min assist w/ UE support. Tactile and verbal cues for upright posture throughout. Pt fatigued quickly, going from standing 2-3 min at a time to only 1 min before needing seated rest break. Returned to room via w/c, ended session in w/c to sit up and eat breakfast, verbal cues needed to attend to task and to swallow before taking another bite. Ended session in w/c and in care of NT.   Therapy Documentation Precautions:  Precautions Precautions: Fall Precaution Comments: expressive aphasia Restrictions Weight Bearing Restrictions: No Vital Signs: Therapy Vitals Temp: 98.3 F (36.8 C) Temp Source: Oral Pulse Rate: 84 Resp: 18 BP: (!) 154/76 Patient Position (if appropriate): Lying Oxygen Therapy SpO2: 99 % O2 Device: Room Air  Therapy/Group: Individual Therapy  [name] [place] 07/21/2018, 8:57 AM

## 2018-07-21 NOTE — Plan of Care (Signed)
  Problem: Consults Goal: RH STROKE PATIENT EDUCATION Description See Patient Education module for education specifics  Outcome: Progressing Goal: Nutrition Consult-if indicated Outcome: Progressing Goal: Diabetes Guidelines if Diabetic/Glucose > 140 Description If diabetic or lab glucose is > 140 mg/dl - Initiate Diabetes/Hyperglycemia Guidelines & Document Interventions  Outcome: Progressing   Problem: RH BOWEL ELIMINATION Goal: RH STG MANAGE BOWEL WITH ASSISTANCE Description STG Manage Bowel with Mod I Assistance.  Outcome: Progressing Goal: RH STG MANAGE BOWEL W/MEDICATION W/ASSISTANCE Description STG Manage Bowel with Medication with Mod I Assistance.  Outcome: Progressing   Problem: RH BLADDER ELIMINATION Goal: RH STG MANAGE BLADDER WITH ASSISTANCE Description STG Manage Bladder With Mod I Assistance  Outcome: Progressing Goal: RH STG MANAGE BLADDER WITH EQUIPMENT WITH ASSISTANCE Description STG Manage Bladder With Equipment With Mod I Assistance  Outcome: Progressing   Problem: RH SAFETY Goal: RH STG ADHERE TO SAFETY PRECAUTIONS W/ASSISTANCE/DEVICE Description STG Adhere to Safety Precautions With Mod I Assistance/Device.  Outcome: Progressing Goal: RH STG DECREASED RISK OF FALL WITH ASSISTANCE Description STG Decreased Risk of Fall With Mod I Assistance.  Outcome: Progressing   Problem: RH KNOWLEDGE DEFICIT Goal: RH STG INCREASE KNOWLEDGE OF DIABETES Description With Min Assistance  Outcome: Progressing Goal: RH STG INCREASE KNOWLEDGE OF HYPERTENSION Description With Min Assistance   Outcome: Progressing Goal: RH STG INCREASE KNOWLEDGE OF DYSPHAGIA/FLUID INTAKE Description With Mod I Assistance   Outcome: Progressing Goal: RH STG INCREASE KNOWLEGDE OF HYPERLIPIDEMIA Description With Min Assistance   Outcome: Progressing Goal: RH STG INCREASE KNOWLEDGE OF STROKE PROPHYLAXIS Description With Min Assistance   Outcome: Progressing

## 2018-07-21 NOTE — Progress Notes (Signed)
Physical Therapy Session Note  Patient Details  Name: Kaitlyn Underwood MRN: [id number] Date of Birth: 1940-08-24  Today's Date: 07/21/2018 PT Individual Time: 1515-1600 PT Individual Time Calculation (min): 45 min   Short Term Goals: Week 2:  PT Short Term Goal 1 (Week 2): STG=LTG due to ELOS  Skilled Therapeutic Interventions/Progress Updates:    Pt seated in w/c upon PT arrival, agreeable to therapy tx and denies pain. Pt transported to the gym. Pt performed stand pivot to mat with RW and min assist. Pt ambulated x 85 ft, x 90 ft and 2 x 60 ft this session working on endurance with RW and CGA, verbal cues for upright posture and obstacle avoidance on R side. Pt performed 2 x 10 sit<>stands without AD pushing from the mat working on LE strength, CGA. Pt ascended/descended 4 steps this session with B handrails and min assist, step to pattern with verbal cues for techniques. Pt transported back to room and left seated with needs in reach and chair alarm set.   Therapy Documentation Precautions:  Precautions Precautions: Fall Precaution Comments: expressive aphasia Restrictions Weight Bearing Restrictions: No   Therapy/Group: Individual Therapy  Netta Corrigan, PT, DPT 07/21/2018, 12:53 PM

## 2018-07-21 NOTE — Progress Notes (Signed)
Speech Language Pathology Daily Session Note  Patient Details  Name: Kaitlyn Underwood MRN: [id number] Date of Birth: July 23, 1941  Today's Date: 07/21/2018 SLP Individual Time: 1300-1355 SLP Individual Time Calculation (min): 55 min  Short Term Goals: Week 2: SLP Short Term Goal 1 (Week 2): Patient will utilize external aids to recall basic, daily information with min assist multimodal cues.   SLP Short Term Goal 2 (Week 2): Patient will recognize and correct verbal errors with min assist verbal cues.   SLP Short Term Goal 3 (Week 2): Patient will name basic, familiar objects for ~90% accuracy with supervision verbal cues.   SLP Short Term Goal 4 (Week 2): Patient will consume current diet with minimal overt s/s of aspiration and supervision verbal cues for use of swallowing compensatory strategies.  SLP Short Term Goal 5 (Week 2): Patient will consume thin liquids via cup with minimal overt s/s of aspiration over 2 sessions with supervision verbal cues prior to upgrade.   Skilled Therapeutic Interventions: Skilled treatment session focused on dysphagia and speech goals. Patient performed oral care and consumed trials of thin liquids via cup with supervision verbal cues without overt s/s of aspiration. Recommend continued trials with solid textures. SLP also facilitated session by providing Mod A verbal cues for word-finding and to self-monitor and correct phonemic paraphasias at the phrase and sentence level. Patient left upright in wheelchair with alarm on and all needs within reach. Continue with current plan of care.      Pain Pain Assessment Pain Score: 0-No pain  Therapy/Group: Individual Therapy  Kaitlyn Underwood 07/21/2018, 2:58 PM

## 2018-07-22 ENCOUNTER — Inpatient Hospital Stay (HOSPITAL_COMMUNITY): Payer: PPO | Admitting: *Deleted

## 2018-07-22 ENCOUNTER — Inpatient Hospital Stay (HOSPITAL_COMMUNITY): Payer: Self-pay

## 2018-07-22 ENCOUNTER — Inpatient Hospital Stay (HOSPITAL_COMMUNITY): Payer: PPO | Admitting: Speech Pathology

## 2018-07-22 ENCOUNTER — Encounter (HOSPITAL_COMMUNITY): Payer: PPO | Admitting: Psychology

## 2018-07-22 ENCOUNTER — Inpatient Hospital Stay (HOSPITAL_COMMUNITY): Payer: PPO

## 2018-07-22 LAB — URINALYSIS, ROUTINE W REFLEX MICROSCOPIC
Bilirubin Urine: NEGATIVE
Glucose, UA: NEGATIVE mg/dL
Hgb urine dipstick: NEGATIVE
Ketones, ur: NEGATIVE mg/dL
Nitrite: NEGATIVE
Protein, ur: NEGATIVE mg/dL
SPECIFIC GRAVITY, URINE: 1.014 (ref 1.005–1.030)
pH: 8 (ref 5.0–8.0)

## 2018-07-22 LAB — GLUCOSE, CAPILLARY
Glucose-Capillary: 91 mg/dL (ref 70–99)
Glucose-Capillary: 107 mg/dL — ABNORMAL HIGH (ref 70–99)
Glucose-Capillary: 94 mg/dL (ref 70–99)

## 2018-07-22 NOTE — Progress Notes (Signed)
Physical Therapy Session Note  Patient Details  Name: Kaitlyn Underwood MRN: [id number] Date of Birth: Nov 01, 1940   Today's Date: 07/22/2018 PT Individual Time: 1130-1200 and 1530- 1638 PT Individual Time Calculation (min): 30 min and 68 minutes  Short Term Goals: Week 2:  PT Short Term Goal 1 (Week 2): STG=LTG due to ELOS  Skilled Therapeutic Interventions/Progress Updates:    Session 1: Pt supine in bed upon PT arrival, agreeable to therapy tx and denies pain. Pt transferred to sitting EOB with supervision. Pt ambulated 2 x 80 ft this session with RW and CGA working on endurance and activity tolerance, verbal cues for upright posture and AD management. Pt performed 2 x 5 sit<>stands with emphasis on UE placement, working on LE strength, supervision with RW. Pt transported back to room and left seated in w/c with needs in reach, chair alarm set.   Session 2: Pt supine in bed upon PT arrival, agreeable to therapy tx and denies pain. Pt transferred to sitting EOB with supervision.Pt transferred to w/c with CGA and transported to the gym. Pt used nustep this session x 7 minutes on workload 5 for global strengthening and endurance. Pt ambulated 3 x 80 ft this session with RW and CGA working on endurance, verbal cues for upright posture and AD management with turns, verbal cues to reach back for w/c with sitting. Pt worked on LE strengthening and balance to perform toe taps on 4 inch step with UE support on RW, x 2 trials. Pt worked on standing balance to perform card matching activity and toss horseshoes. Pt transported back to room, ambulated from w/c<>bathroom 2 x 5 ft with RW and min assist, min assist for balance while performing clothing management. Pt incontinent of bladder this session secondary to urgency, therapist assisted to don clean pants. Pt left seated in w/c at end of session with needs in reach and chair alarm set.    Therapy Documentation Precautions:  Precautions Precautions:  Fall Precaution Comments: expressive aphasia Restrictions Weight Bearing Restrictions: No   Therapy/Group: Individual Therapy  Netta Corrigan, PT, DPT 07/22/2018, 7:52 AM

## 2018-07-22 NOTE — Progress Notes (Signed)
Recreational Therapy Session Note  Patient Details  Name: Kaitlyn Underwood MRN: [id number] Date of Birth: [birth year] Today's Date: 07/22/2018   Full eval deferred as pt is scheduled for discharge home on 12/7 and has limited leisure interests. [place] 07/22/2018, 3:41 PM

## 2018-07-22 NOTE — Progress Notes (Signed)
[place] PHYSICAL MEDICINE & REHABILITATION PROGRESS NOTE   Subjective/Complaints:  No issues overnite per OT seems more confused than last week Pt oriented to person "[facility]" but self corrcted to [place] for CP, SOB, N/V/D, or body aches or pains  Objective:   No results found. Recent Labs    07/20/18 0856  WBC 8.4  HGB 12.5  HCT 39.0  PLT 466*   Recent Labs    07/20/18 0856  NA 141  K 4.1  CL 104  CO2 29  GLUCOSE 133*  BUN 10  CREATININE 0.92  CALCIUM 9.6    Intake/Output Summary (Last 24 hours) at 07/22/2018 0928 Last data filed at 07/21/2018 2020 Gross per 24 hour  Intake 510 ml  Output -  Net 510 ml     Physical Exam: Vital Signs Blood pressure 139/62, pulse 60, temperature 98.4 F (36.9 C), temperature source Oral, resp. rate 18, height '5\' 2"'$  (1.575 m), weight 86.2 kg, SpO2 96 %.   General: No acute distress Mood and affect are appropriate Heart: Regular rate and rhythm no rubs murmurs or extra sounds Lungs: Clear to auscultation, breathing unlabored, neg wheezes  Abdomen: Positive bowel sounds, soft nontender to palpation, nondistended Extremities: No clubbing, cyanosis, or edema Skin: No evidence of breakdown, no evidence of rash Neurologic: Cranial nerves II through XII intact, motor strength is 5/5 in Left and 4/5 on Right  deltoid, bicep, tricep, grip, hip flexor, knee extensors, ankle dorsiflexor and plantar flexor Sensory exam normal sensation to light touch and proprioception in bilateral upper and lower extremities Cerebellar exam normal finger to nose to finger as well as heel to shin in bilateral upper and lower extremities Musculoskeletal: Full range of motion in all 4 extremities. No joint swelling   Assessment/Plan: 1. Functional deficits secondary to Left MCA infarct  which require 3+ hours per day of interdisciplinary therapy in a comprehensive inpatient rehab setting.  Physiatrist is providing close team  supervision and 24 hour management of active medical problems listed below.  Physiatrist and rehab team continue to assess barriers to discharge/monitor patient progress toward functional and medical goals  Care Tool:  Bathing    Body parts bathed by patient: Right arm, Left arm, Chest, Abdomen, Right upper leg, Left upper leg, Right lower leg, Left lower leg, Face, Front perineal area, Buttocks   Body parts bathed by helper: Buttocks Body parts n/a: Buttocks, Front perineal area(did not attempt secondary to female therapist)   Bathing assist Assist Level: Contact Guard/Touching assist     Upper Body Dressing/Undressing Upper body dressing   What is the patient wearing?: Pull over shirt    Upper body assist Assist Level: Supervision/Verbal cueing    Lower Body Dressing/Undressing Lower body dressing      What is the patient wearing?: Incontinence brief, Pants     Lower body assist Assist for lower body dressing: Minimal Assistance - Patient > 75%     Toileting Toileting    Toileting assist Assist for toileting: Moderate Assistance - Patient 50 - 74%     Transfers Chair/bed transfer  Transfers assist  Chair/bed transfer activity did not occur: Safety/medical concerns  Chair/bed transfer assist level: Minimal Assistance - Patient > 75%     Locomotion Ambulation   Ambulation assist      Assist level: Contact Guard/Touching assist Assistive device: Walker-rolling Max distance: 80 ft   Walk 10 feet activity   Assist     Assist level: Contact  Guard/Touching assist Assistive device: Walker-rolling   Walk 50 feet activity   Assist    Assist level: Contact Guard/Touching assist Assistive device: Walker-rolling    Walk 150 feet activity   Assist Walk 150 feet activity did not occur: Safety/medical concerns  Assist level: Minimal Assistance - Patient > 75% Assistive device: Walker-rolling    Walk 10 feet on uneven surface  activity   Assist  Walk 10 feet on uneven surfaces activity did not occur: Safety/medical concerns         Wheelchair     Assist   Type of Wheelchair: Manual    Wheelchair assist level: Contact Guard/Touching assist Max wheelchair distance: 50    Wheelchair 50 feet with 2 turns activity    Assist        Assist Level: Contact Guard/Touching assist   Wheelchair 150 feet activity     Assist Wheelchair 150 feet activity did not occur: Safety/medical concerns        Medical Problem List and Plan: 1.Right side weakness with facial droop and aphasiasecondary to left MCA infarction Team conference today please see physician documentation under team conference tab, met with team face-to-face to discuss problems,progress, and goals. Formulized individual treatment plan based on medical history, underlying problem and comorbidities. 2. DVT Prophylaxis/Anticoagulation: Eliquis 3. Pain Management:Tylenol as needed 4. Mood:Zoloft 25 mg daily 5. Neuropsych: This patientiscapable of making decisions on herown behalf. 6. Skin/Wound Care:Routine skin checks 7. Fluids/Electrolytes/Nutrition:I 673m on 11/28BMET normal 11/29, hold off on IV fluid supplementation    8. Dysphagia. Dysphagia #1 pudding thick liquids. Upgrade to D2 nectar on 11/27 Monitor hydration. Follow-up speech therapy I 7512myesterday- recheck BMET  9. Hypertension. Toprol '25mg'$ , HR running low will reduce Vitals:   07/22/18 0414 07/22/18 0756  BP: 139/62   Pulse: 60   Resp: 18   Temp: 98.4 F (36.9 C)   SpO2: 98% 96%  BP elevated sys 12/2, restart captopril but at low dose, monitor effect 10. Atrial fibrillation. Lanoxin 0.125 mg daily. Cardiac rate controlled- check dig level 11.COPD.pt states she has not received inhaler this am - on Singulair '10mg'$  qhs, Pulmicort .5 BID neb, prn albuterol  12.Chronic systolic congestive heart failure.Monitor for any signs of fluid overload, no peripheral  edema or rales  -daily weights ordered, check CXR given wheezes and IVF at noc, CXR shows no pulm edema, also has no LE edema 13. Hyperlipidemia. Pravachol 14. Pre-diabetes. Hemoglobin A1c 5.6. SSI  15.  Appears more confused over the last several days likely UTI, afebrile, + UA Cx shows multi organisms likely contamination- recheck cath UA C and S , on empiric Keflex LOS: 12 days A FACE TO FACE EVALUATION WAS PERFORMED  AnCharlett Blake2/11/2017, 9:28 AM

## 2018-07-22 NOTE — Progress Notes (Signed)
Speech Language Pathology Daily Session Note  Patient Details  Name: Kaitlyn Underwood MRN: [id number] Date of Birth: 02-Jul-1941  Today's Date: 07/22/2018 SLP Individual Time: 0720-0815 SLP Individual Time Calculation (min): 55 min  Short Term Goals: Week 2: SLP Short Term Goal 1 (Week 2): Patient will utilize external aids to recall basic, daily information with min assist multimodal cues.   SLP Short Term Goal 2 (Week 2): Patient will recognize and correct verbal errors with min assist verbal cues.   SLP Short Term Goal 3 (Week 2): Patient will name basic, familiar objects for ~90% accuracy with supervision verbal cues.   SLP Short Term Goal 4 (Week 2): Patient will consume current diet with minimal overt s/s of aspiration and supervision verbal cues for use of swallowing compensatory strategies.  SLP Short Term Goal 5 (Week 2): Patient will consume thin liquids via cup with minimal overt s/s of aspiration over 2 sessions with supervision verbal cues prior to upgrade.   Skilled Therapeutic Interventions: Skilled treatment session focused on dysphagia and cognitive goals. SLP facilitated session by providing skilled observation with breakfast meal of Dys. 2 textures and trials of thin liquids. Patient consumed meal with overt cough X1 and supervision verbal cues for use of swallowing compensatory strategies. Recommend patient upgrade to thin liquids and continue full supervision. SLP also facilitated session by providing Mod A verbal cues for patient to self-monitor and correct errors and to slow down in order to reduce verbal impulsivity while reading aloud at the phrase level. Patient also required Min A question and phonemic cues for word-finding during phrase completion task. Patient appears to require increased cueing for verbal expression, question if due to UTI and fatigue. Patient left upright in bed with alarm on and all needs within reach. Continue with current plan of care.         Pain No/Denies Pain   Therapy/Group: Individual Therapy  [name] 07/22/2018, 8:21 AM

## 2018-07-22 NOTE — Consult Note (Signed)
Neuropsychological Consultation   Patient:   Kaitlyn Underwood   DOB:   Mar 08, 1941  MR Number:  [id number]  Location:  [facility] [id number] [place] [address] Dept: [place]: [phone]           Date of Service:   07/22/2018  Start Time:   1 PM End Time:   2 PM  Provider/Observer:  Ilean Skill, Psy.D.       Clinical Neuropsychologist       Billing Code/Service: [id number] 4 Units  Chief Complaint:    Kaitlyn Underwood is a 77 year old right handed female with history of hypertension, Pre-diabetes, hyperlipidemia, afib, chronic systolic congestive heart failure, COPD, CAD.  Presented to [facility] on 07/06/2018 with right-sided weakness and aphasia.  CT/CTA showed occlusion of substantial vessel in the left MCA distribution.  Patient did not receive TPA and was transferred to [facility] for further evaluation.  MRI showed small to moderate evolving acute left MCA territory infarction with associated petechial hemorrhage without significant hemorrhagic transformation.  The patient did undergo revascularization thrombectomy per IR.  The patient has had ongoing motor deficits in the right side as well as significant expressive language deficits.  They have at times been improving but have waxed and waned as far as the severity of her nonfluent aphasia.  Reason for Service:  The patient was referred for neuropsychological consult due to coping and adjustment issues following her long hospital course and residual motor and expressive language deficits.  The patient has supportive children but there are some complex issues between the patient and her husband.  Below is the HPI for the current admission.  Kaitlyn Underwood is a 77 year old right handed female history of hypertension,Pre-diabetes, hyperlipidemia,atrial fibrillation maintained on Lanoxin, chronic systolic congestive heart  failure, COPD, CAD maintained on aspirin. Patient reportedly lives with spouse. Presented to [facility] 07/06/2018 with right-sided weakness and aphasia. CT/CTA outside hospital showed occlusion of substantial vessel in the left MCA distribution. Patient didn't receive TPA and was transferred to [facility] for further evaluation. MRI the brain showed small to moderate evolving acute left MCA territory infarction with associated petechial hemorrhage without frank hemorrhagic transformation. Negative MRA. Underwent revascularization thrombectomy per interventional radiology. EEG negative for seizure. Echocardiogram with ejection fraction of 30% diffuse hypokinesis. Dysphagia #1 pudding thick liquid diet. Currently maintained on Eliquis for CVA prophylaxis. Therapy evaluations completed with recommendations of physical medicine rehabilitation consult. Patient was admitted for a comprehensive rehabilitation program.  Current Status:  The patient denies any significant major depressive symptoms but did acknowledge appropriate emotional response to her current deficits.  The patient reports that she is able to fully understand what others are saying and does not feel there are any deficits in receptive language abilities.  She was able to effectively express her thoughts and feelings but she clearly had difficulty with verbal fluency and word finding issues.  The patient reports that she is remain motivated to achieve functional improvements and is appreciative of the help that she is receiving from her children.  Executive functioning, memory do appear to be intact although the patient does appear to have some slowed response time but this is primarily verbal in nature.  Behavioral Observation: Kaitlyn Underwood  presents as a 77 y.o.-year-old Right Caucasian Female who appeared her stated age. her dress was Appropriate and she was Well Groomed and her manners were Appropriate  to the situation.  her  participation was indicative of Appropriate behaviors.  There were any physical disabilities noted.  she displayed an appropriate level of cooperation and motivation.     Interactions:    Active Appropriate and Redirectable  Attention:   abnormal and attention span appeared shorter than expected for age  Memory:   within normal limits; recent and remote memory intact  Visuo-spatial:  not examined  Speech (Volume):  low  Speech:   non-fluent aphasia;   Thought Process:  Coherent and Relevant  Though Content:  WNL; not suicidal and not homicidal  Orientation:   person, place, time/date and situation  Judgment:   Fair  Planning:   Fair  Affect:    Anxious  Mood:    Dysphoric  Insight:   Fair  Intelligence:   normal  Medical History:   Past Medical History:  Diagnosis Date  . Anxiety   . Atrial fibrillation ([place])   . Chronic systolic heart failure ([place])   . COPD (chronic obstructive pulmonary disease) ([place])   . Coronary artery disease   . Hypertension   . Hypothyroidism   . Left renal mass    unconfirmed documentation of "kidney cancer", possibly cyst  . Pre-diabetes    Psychiatric History:  The patient does have a prior history of anxiety but denies any significant major depressive symptoms.  She does report that she is quite upset about the fact that she has had the stroke and the residual functional deficits present.  However, she reports that while her anxiety continues it is not become exacerbated to the point that would have a negative effect on her ability to participate in therapeutic interventions.  Family Med/Psych History:  Family History  Problem Relation Age of Onset  . Arrhythmia Sister   . Heart disease Brother   . Heart attack Maternal Uncle     Risk of Suicide/Violence: low the patient denies any suicidal or homicidal ideation.  Impression/DX:  Kaitlyn Underwood is a 77 year old right handed female with history of hypertension, Pre-diabetes,  hyperlipidemia, afib, chronic systolic congestive heart failure, COPD, CAD.  Presented to [facility] on 07/06/2018 with right-sided weakness and aphasia.  CT/CTA showed occlusion of substantial vessel in the left MCA distribution.  Patient did not receive TPA and was transferred to [facility] for further evaluation.  MRI showed small to moderate evolving acute left MCA territory infarction with associated petechial hemorrhage without significant hemorrhagic transformation.  The patient did undergo revascularization thrombectomy per IR.  The patient has had ongoing motor deficits in the right side as well as significant expressive language deficits.  They have at times been improving but have waxed and waned as far as the severity of her nonfluent aphasia.  The patient denies any significant major depressive symptoms but did acknowledge appropriate emotional response to her current deficits.  The patient reports that she is able to fully understand what others are saying and does not feel there are any deficits in receptive language abilities.  She was able to effectively express her thoughts and feelings but she clearly had difficulty with verbal fluency and word finding issues.  The patient reports that she is remain motivated to achieve functional improvements and is appreciative of the help that she is receiving from her children.  Executive functioning, memory do appear to be intact although the patient does appear to have some slowed response time but this is primarily verbal in nature.  Disposition/Plan:  Today we worked  on coping and adjustment particular to the stress associated with her expressive language deficits.  The patient has expressed some concerns about how much her daughters will need to help and take care of her going forward.  The patient has some worry about the degree of impairments that will be lingering.  We worked on coping and adjusting strategies for dealing with both  her acute issues as well as how to deal with worries about long-term functioning.  Diagnosis:    Left middle cerebral artery stroke [facility]) - Plan: Ambulatory referral to Physical Medicine Rehab  Wheezing - Plan: DG Chest 2 View, DG Chest 2 View         Electronically Signed   _______________________ Ilean Skill, Psy.D.

## 2018-07-22 NOTE — Plan of Care (Signed)
  Problem: Consults Goal: RH STROKE PATIENT EDUCATION Description See Patient Education module for education specifics  Outcome: Progressing Goal: Nutrition Consult-if indicated Outcome: Progressing Goal: Diabetes Guidelines if Diabetic/Glucose > 140 Description If diabetic or lab glucose is > 140 mg/dl - Initiate Diabetes/Hyperglycemia Guidelines & Document Interventions  Outcome: Progressing   Problem: RH BOWEL ELIMINATION Goal: RH STG MANAGE BOWEL WITH ASSISTANCE Description STG Manage Bowel with Mod I Assistance.  Outcome: Progressing Goal: RH STG MANAGE BOWEL W/MEDICATION W/ASSISTANCE Description STG Manage Bowel with Medication with Mod I Assistance.  Outcome: Progressing   Problem: RH BLADDER ELIMINATION Goal: RH STG MANAGE BLADDER WITH ASSISTANCE Description STG Manage Bladder With Mod I Assistance  Outcome: Progressing Goal: RH STG MANAGE BLADDER WITH EQUIPMENT WITH ASSISTANCE Description STG Manage Bladder With Equipment With Mod I Assistance  Outcome: Progressing   Problem: RH SAFETY Goal: RH STG ADHERE TO SAFETY PRECAUTIONS W/ASSISTANCE/DEVICE Description STG Adhere to Safety Precautions With Mod I Assistance/Device.  Outcome: Progressing Goal: RH STG DECREASED RISK OF FALL WITH ASSISTANCE Description STG Decreased Risk of Fall With Mod I Assistance.  Outcome: Progressing   Problem: RH KNOWLEDGE DEFICIT Goal: RH STG INCREASE KNOWLEDGE OF DIABETES Description With Min Assistance  Outcome: Progressing Goal: RH STG INCREASE KNOWLEDGE OF HYPERTENSION Description With Min Assistance   Outcome: Progressing Goal: RH STG INCREASE KNOWLEDGE OF DYSPHAGIA/FLUID INTAKE Description With Mod I Assistance   Outcome: Progressing Goal: RH STG INCREASE KNOWLEGDE OF HYPERLIPIDEMIA Description With Min Assistance   Outcome: Progressing Goal: RH STG INCREASE KNOWLEDGE OF STROKE PROPHYLAXIS Description With Min Assistance   Outcome: Progressing

## 2018-07-22 NOTE — Progress Notes (Signed)
Occupational Therapy Session Note  Patient Details  Name: Kaitlyn Underwood MRN: [id number] Date of Birth: August 05, 1941  Today's Date: 07/22/2018 OT Individual Time: 3536-1443 OT Individual Time Calculation (min): 60 min    Short Term Goals: Week 1:  OT Short Term Goal 1 (Week 1): Pt's STGs are equal to LTGs set at supervision level.    Skilled Therapeutic Interventions/Progress Updates:    1:1. No c/o pain. Pt agreeable to shower sit to stand from TTB in walk in shower. Pt ambulates to/from bathroom with S-CGA and min A for RW managemetn d/t R inatention with VC for looking in R visual field for obstacles. Pt unable to adjust water temp appropriately despite mod VC. Pt requires mod VC for sequencing bathing body parts and CGA for sitting balance while washing RLE/standing balance while washing peri area and buttocks. Pt dresses seated in w/c at sink with A to orient bra as pt attempt to don upside down and unable to fix. Pt dons shirt with supervision. Pt grooms seated in w/c with supervision. Pt dons pants/socks with min A to fully advance pants past hips. Exited session iwht pt seated in bed, exit alarm on and call light in reach  Therapy Documentation Precautions:  Precautions Precautions: Fall Precaution Comments: expressive aphasia Restrictions Weight Bearing Restrictions: No   Therapy/Group: Individual Therapy  Tonny Branch 07/22/2018, 8:34 AM

## 2018-07-23 ENCOUNTER — Inpatient Hospital Stay (HOSPITAL_COMMUNITY): Payer: Self-pay | Admitting: Physical Therapy

## 2018-07-23 ENCOUNTER — Inpatient Hospital Stay (HOSPITAL_COMMUNITY): Payer: PPO | Admitting: Speech Pathology

## 2018-07-23 ENCOUNTER — Inpatient Hospital Stay (HOSPITAL_COMMUNITY): Payer: PPO | Admitting: Occupational Therapy

## 2018-07-23 LAB — GLUCOSE, CAPILLARY
Glucose-Capillary: 106 mg/dL — ABNORMAL HIGH (ref 70–99)
Glucose-Capillary: 100 mg/dL — ABNORMAL HIGH (ref 70–99)
Glucose-Capillary: 101 mg/dL — ABNORMAL HIGH (ref 70–99)

## 2018-07-23 MED ORDER — CAPTOPRIL 12.5 MG PO TABS
12.5000 mg | ORAL_TABLET | Freq: Three times a day (TID) | ORAL | Status: DC
  Administered 2018-07-23 – 2018-07-25 (×6): 12.5 mg via ORAL
  Filled 2018-07-23 (×8): qty 1

## 2018-07-23 NOTE — Progress Notes (Signed)
Occupational Therapy Session Note  Patient Details  Name: Kaitlyn Underwood MRN: [id number] Date of Birth: 1941/07/21  Today's Date: 07/23/2018 OT Individual Time: 1500-1530 OT Individual Time Calculation (min): 30 min    Short Term Goals: Week 1:  OT Short Term Goal 1 (Week 1): Pt's STGs are equal to LTGs set at supervision level.    Skilled Therapeutic Interventions/Progress Updates:    Patient ready for therapy and able to introduce me to her husband.   TA completed this afternoon to include  - standing with unilateral support to focus on reach and turn, upright posture and balance - she was able to reach behind her and turn with cues and encouragement CG A (attemoted standing activity without UE support - she is able to stand with min A but not able to reach and displays limited trunk mobility) UE FMC/dexterity activities with cards, cups with min difficulty. Returned to patient room where she remained in her w/c with seat belt alarm set.   Therapy Documentation Precautions:  Precautions Precautions: Fall Precaution Comments: expressive aphasia Restrictions Weight Bearing Restrictions: No General:   Vital Signs: Therapy Vitals Temp: 97.9 F (36.6 C) Pulse Rate: 70 Resp: 18 BP: (!) 167/90 Patient Position (if appropriate): Sitting Oxygen Therapy SpO2: 98 % O2 Device: Room Air Pain: Pain Assessment Pain Scale: 0-10 Pain Score: 0-No pain   Therapy/Group: Individual Therapy  Carlos Levering 07/23/2018, 3:55 PM

## 2018-07-23 NOTE — Progress Notes (Signed)
Occupational Therapy Session Note  Patient Details  Name: Kaitlyn Underwood MRN: [id number] Date of Birth: 1941-01-22  Today's Date: 07/23/2018 OT Individual Time: 1100-1200 OT Individual Time Calculation (min): 60 min    Short Term Goals: Week 2:     Skilled Therapeutic Interventions/Progress Updates:    Patient pleasant and cooperative, aphasia limits her ability to share her thoughts but she is able to express basic wants and needs.   She denies pain and is ready for therapy session. Standing tolerance and balance - reaching activity with CS, support of table top, able to stand for 3 - 5 minutes x 2 during session. Word game with min A, FMC/strength deficits of right hand limit writing ability but words are legible Core mobility and reaching tasks in SSP with min A for posture ADL - provided scoop plate and dycem - reviewed use and care of these items, practiced scooping activity with min difficulty - she is pleased with this option for meals (nursing aware that this is available) Patient returned to room, seated in w/c with nursing.  Therapy Documentation Precautions:  Precautions Precautions: Fall Precaution Comments: expressive aphasia Restrictions Weight Bearing Restrictions: No General: General PT Missed Treatment Reason: Patient fatigue Vital Signs:  Pain: Pain Assessment Pain Scale: 0-10 Pain Score: 0-No pain   Therapy/Group: Individual Therapy  Carlos Levering 07/23/2018, 12:26 PM

## 2018-07-23 NOTE — Progress Notes (Signed)
Speech Language Pathology Daily Session Note  Patient Details  Name: Kaitlyn Underwood MRN: [id number] Date of Birth: [birth year]  Today's Date: 07/23/2018 SLP Individual Time: 1345-1430 SLP Individual Time Calculation (min): 45 min  Short Term Goals: Week 2: SLP Short Term Goal 1 (Week 2): Patient will utilize external aids to recall basic, daily information with min assist multimodal cues.   SLP Short Term Goal 2 (Week 2): Patient will recognize and correct verbal errors with min assist verbal cues.   SLP Short Term Goal 3 (Week 2): Patient will name basic, familiar objects for ~90% accuracy with supervision verbal cues.   SLP Short Term Goal 4 (Week 2): Patient will consume current diet with minimal overt s/s of aspiration and supervision verbal cues for use of swallowing compensatory strategies.  SLP Short Term Goal 5 (Week 2): Patient will consume thin liquids via cup with minimal overt s/s of aspiration over 2 sessions with supervision verbal cues prior to upgrade.   Skilled Therapeutic Interventions: Skilled treatment session focused on communication and dysphagia goals. SLP facilitated session by providing extra time and overall supervision verbal cues for patient to name functional items with 100% accuracy. Patient generated the function and associated another word with the functional item with extra time and overall Min A verbal cues for word-finding. Patient consumed thin liquids via cup throughout session without overt s/s of aspiration. Patient left upright in wheelchair with alarm on and all needs within reach. Continue with current plan of care.      Pain Pain Assessment Pain Scale: 0-10 Pain Score: 0-No pain  Therapy/Group: Individual Therapy  Eligah Anello 07/23/2018, 3:39 PM

## 2018-07-23 NOTE — Progress Notes (Signed)
[facility] PHYSICAL MEDICINE & REHABILITATION PROGRESS NOTE   Subjective/Complaints: Appreciate neuropsych note  ROS  Neg for CP, SOB, N/V/D, or body aches or pains  Objective:   No results found. Recent Labs    07/20/18 0856  WBC 8.4  HGB 12.5  HCT 39.0  PLT 466*   Recent Labs    07/20/18 0856  NA 141  K 4.1  CL 104  CO2 29  GLUCOSE 133*  BUN 10  CREATININE 0.92  CALCIUM 9.6    Intake/Output Summary (Last 24 hours) at 07/23/2018 0829 Last data filed at 07/22/2018 1850 Gross per 24 hour  Intake 800 ml  Output 100 ml  Net 700 ml     Physical Exam: Vital Signs Blood pressure (!) 154/83, pulse 63, temperature 98.1 F (36.7 C), resp. rate 17, height 5\' 2"  (1.575 m), weight 86.4 kg, SpO2 98 %.   General: No acute distress Mood and affect are appropriate Heart: Regular rate and rhythm no rubs murmurs or extra sounds Lungs: Clear to auscultation, breathing unlabored, neg wheezes  Abdomen: Positive bowel sounds, soft nontender to palpation, nondistended Extremities: No clubbing, cyanosis, or edema Skin: No evidence of breakdown, no evidence of rash Neurologic: Cranial nerves II through XII intact, motor strength is 5/5 in Left and 4/5 on Right  deltoid, bicep, tricep, grip, hip flexor, knee extensors, ankle dorsiflexor and plantar flexor Sensory exam normal sensation to light touch and proprioception in bilateral upper and lower extremities Cerebellar exam normal finger to nose to finger as well as heel to shin in bilateral upper and lower extremities Musculoskeletal: Full range of motion in all 4 extremities. No joint swelling   Assessment/Plan: 1. Functional deficits secondary to Left MCA infarct  which require 3+ hours per day of interdisciplinary therapy in a comprehensive inpatient rehab setting.  Physiatrist is providing close team supervision and 24 hour management of active medical problems listed below.  Physiatrist and rehab team continue to assess  barriers to discharge/monitor patient progress toward functional and medical goals  Care Tool:  Bathing    Body parts bathed by patient: Right arm, Left arm, Chest, Abdomen, Right upper leg, Left upper leg, Right lower leg, Left lower leg, Face, Front perineal area, Buttocks   Body parts bathed by helper: Buttocks Body parts n/a: Buttocks, Front perineal area(did not attempt secondary to female therapist)   Bathing assist Assist Level: Contact Guard/Touching assist     Upper Body Dressing/Undressing Upper body dressing   What is the patient wearing?: Pull over shirt    Upper body assist Assist Level: Supervision/Verbal cueing    Lower Body Dressing/Undressing Lower body dressing      What is the patient wearing?: Incontinence brief, Pants     Lower body assist Assist for lower body dressing: Minimal Assistance - Patient > 75%     Toileting Toileting    Toileting assist Assist for toileting: Moderate Assistance - Patient 50 - 74%     Transfers Chair/bed transfer  Transfers assist  Chair/bed transfer activity did not occur: Safety/medical concerns  Chair/bed transfer assist level: Minimal Assistance - Patient > 75%     Locomotion Ambulation   Ambulation assist      Assist level: Contact Guard/Touching assist Assistive device: Walker-rolling Max distance: 80 ft   Walk 10 feet activity   Assist     Assist level: Contact Guard/Touching assist Assistive device: Walker-rolling   Walk 50 feet activity   Assist    Assist level: Contact Guard/Touching assist  Assistive device: Walker-rolling    Walk 150 feet activity   Assist Walk 150 feet activity did not occur: Safety/medical concerns  Assist level: Minimal Assistance - Patient > 75% Assistive device: Walker-rolling    Walk 10 feet on uneven surface  activity   Assist Walk 10 feet on uneven surfaces activity did not occur: Safety/medical concerns         Wheelchair     Assist    Type of Wheelchair: Manual    Wheelchair assist level: Contact Guard/Touching assist Max wheelchair distance: 50    Wheelchair 50 feet with 2 turns activity    Assist        Assist Level: Contact Guard/Touching assist   Wheelchair 150 feet activity     Assist Wheelchair 150 feet activity did not occur: Safety/medical concerns        Medical Problem List and Plan: 1.Right side weakness with facial droop and aphasiasecondary to left MCA infarction CIR PT, OT 2. DVT Prophylaxis/Anticoagulation: Eliquis 3. Pain Management:Tylenol as needed 4. Mood:Zoloft 25 mg daily 5. Neuropsych: This patientiscapable of making decisions on herown behalf. 6. Skin/Wound Care:Routine skin checks 7. Fluids/Electrolytes/Nutrition:I 632ml on 11/28BMET normal 11/29, hold off on IV fluid supplementation    8. Dysphagia. Dysphagia #1 pudding thick liquids. Upgrade to D2 nectar on 11/27 Monitor hydration. Follow-up speech therapy I 792ml yesterday- recheck BMET  9. Hypertension. Toprol 25mg , HR running low will reduce Vitals:   07/22/18 2230 07/23/18 0525  BP: (!) 146/82 (!) 154/83  Pulse: 62 63  Resp:  17  Temp:  98.1 F (36.7 C)  SpO2:  98%  BP mild elevation increase capoten  to TID   10. Atrial fibrillation. Lanoxin 0.125 mg daily. Cardiac rate controlled- check dig level 11.COPD.pt states she has not received inhaler this am - on Singulair 10mg  qhs, Pulmicort .5 BID neb, prn albuterol  12.Chronic systolic congestive heart failure.Monitor for any signs of fluid overload, no peripheral edema or rales  -daily weights ordered, check CXR given wheezes and IVF at noc, CXR shows no pulm edema, also has no LE edema 13. Hyperlipidemia. Pravachol 14. Pre-diabetes. Hemoglobin A1c 5.6. SSI  15.  Appears more confused over the last several days likely UTI, afebrile, + UA Cx shows multi organisms likely contamination- recheck cath UA neg C and S  Pending ,  continue Keflex LOS: 13 days A FACE TO FACE EVALUATION WAS PERFORMED  Charlett Blake 07/23/2018, 8:29 AM

## 2018-07-23 NOTE — Progress Notes (Signed)
Physical Therapy Session Note  Patient Details  Name: Kaitlyn Underwood MRN: [id number] Date of Birth: Apr 03, 1941  Today's Date: 07/23/2018 PT Individual Time: 0950-1050 PT Individual Time Calculation (min): 60 min   Short Term Goals: Week 2:  PT Short Term Goal 1 (Week 2): STG=LTG due to ELOS  Skilled Therapeutic Interventions/Progress Updates:   Pt in supine and agreeable to therapy, denies pain. Transferred to EOB w/ supervision and donned TED hose, socks, and pants w/ total assist for time management. Total assist w/c transport to/from gym for energy conservation. Session focused on functional strengthening and endurance w/ functional mobility. Negotiated 4 steps w/ bilateral rails (home access is 3 steps w/ bilateral rails), CGA and verbal cues for step pattern and safety. Ambulated 100' x2 w/ RW and CGA-close supervision. Additionally ambulated in household environment by weaving through cones and tight spaces, CGA for safety. Performed NuStep 8 min @ level 4 for global strengthening and endurance. Verbal cues for breathing pattern. Moderate increase in work of breathing w/ all functional mobility. Pt w/ increased fatigue at end of session, requiring prolonged seated rest breaks to recover. Returned to room and ended session in w/c, all needs in reach. Missed 15 min of skilled PT 2/2 fatigue.   Therapy Documentation Precautions:  Precautions Precautions: Fall Precaution Comments: expressive aphasia Restrictions Weight Bearing Restrictions: No Pain: Pain Assessment Pain Scale: 0-10 Pain Score: 0-No pain  Therapy/Group: Individual Therapy  Jenella Craigie K Dalayza Zambrana 07/23/2018, 10:58 AM

## 2018-07-24 ENCOUNTER — Encounter (HOSPITAL_COMMUNITY): Payer: PPO | Admitting: Speech Pathology

## 2018-07-24 ENCOUNTER — Inpatient Hospital Stay (HOSPITAL_COMMUNITY): Payer: PPO | Admitting: Physical Therapy

## 2018-07-24 ENCOUNTER — Encounter (HOSPITAL_COMMUNITY): Payer: PPO | Admitting: Occupational Therapy

## 2018-07-24 ENCOUNTER — Inpatient Hospital Stay (HOSPITAL_COMMUNITY): Payer: PPO

## 2018-07-24 LAB — URINE CULTURE: Culture: 30000 — AB

## 2018-07-24 LAB — GLUCOSE, CAPILLARY
Glucose-Capillary: 129 mg/dL — ABNORMAL HIGH (ref 70–99)
Glucose-Capillary: 99 mg/dL (ref 70–99)
Glucose-Capillary: 111 mg/dL — ABNORMAL HIGH (ref 70–99)
Glucose-Capillary: 94 mg/dL (ref 70–99)
Glucose-Capillary: 120 mg/dL — ABNORMAL HIGH (ref 70–99)

## 2018-07-24 MED ORDER — PRAVASTATIN SODIUM 20 MG PO TABS: 20.0000 mg | ORAL_TABLET | Freq: Every day | ORAL | 1 refills | Status: AC

## 2018-07-24 MED ORDER — DIGOXIN 125 MCG PO TABS: 125.0000 ug | ORAL_TABLET | Freq: Every day | ORAL | 1 refills | Status: DC

## 2018-07-24 MED ORDER — PANTOPRAZOLE SODIUM 40 MG PO PACK: 40.0000 mg | PACK | Freq: Every day | ORAL | 1 refills | Status: DC

## 2018-07-24 MED ORDER — ALBUTEROL SULFATE HFA 108 (90 BASE) MCG/ACT IN AERS: 2.0000 | INHALATION_SPRAY | Freq: Four times a day (QID) | RESPIRATORY_TRACT | 1 refills | Status: AC | PRN

## 2018-07-24 MED ORDER — METOPROLOL TARTRATE 25 MG PO TABS: 12.5000 mg | ORAL_TABLET | Freq: Two times a day (BID) | ORAL | 1 refills | Status: AC

## 2018-07-24 MED ORDER — SERTRALINE HCL 25 MG PO TABS: 25.0000 mg | ORAL_TABLET | Freq: Every day | ORAL | 1 refills | Status: AC

## 2018-07-24 MED ORDER — APIXABAN 5 MG PO TABS: 5.0000 mg | ORAL_TABLET | Freq: Two times a day (BID) | ORAL | 1 refills | Status: AC

## 2018-07-24 MED ORDER — CAPTOPRIL 50 MG PO TABS: 50.0000 mg | ORAL_TABLET | Freq: Three times a day (TID) | ORAL | 3 refills | Status: DC

## 2018-07-24 MED ORDER — CEPHALEXIN 250 MG PO CAPS: 250.0000 mg | ORAL_CAPSULE | Freq: Three times a day (TID) | ORAL | 0 refills | Status: DC

## 2018-07-24 MED ORDER — ACETAMINOPHEN 325 MG PO TABS: 650.0000 mg | ORAL_TABLET | ORAL | Status: AC | PRN

## 2018-07-24 MED ORDER — MONTELUKAST SODIUM 10 MG PO TABS: 10.0000 mg | ORAL_TABLET | Freq: Every day | ORAL | 1 refills | Status: AC

## 2018-07-24 NOTE — Progress Notes (Signed)
Occupational Therapy Discharge Summary  Patient Details  Name: Kaitlyn Underwood MRN: [id number] Date of Birth: 03/10/1941   Patient has met 11 of 12 long term goals due to improved activity tolerance, improved balance, postural control, ability to compensate for deficits, functional use of  RIGHT upper and RIGHT lower extremity, improved attention, improved awareness and improved coordination.  Patient to discharge at overall Supervision to min A level.  Patient's care partner is independent to provide the necessary physical and cognitive assistance at discharge.    Reasons goals not met: Pt did not meet LB dressing goal of S. She needs A to don socks and shoes and occasional assist to pull pants over hips.  Recommendation:  Patient will benefit from ongoing skilled OT services in home health setting to continue to advance functional skills in the area of BADL.  Equipment: transfer tub bench and BSC  Reasons for discharge: treatment goals met  Patient/family agrees with progress made and goals achieved: Yes  OT Discharge Precautions/Restrictions  Precautions Precautions: Fall Restrictions Weight Bearing Restrictions: No  ADL ADL Eating: Set up Where Assessed-Eating: Wheelchair Grooming: Setup Where Assessed-Grooming: Wheelchair Upper Body Bathing: Supervision/safety Where Assessed-Upper Body Bathing: Shower Lower Body Bathing: Close S Where Assessed-Lower Body Bathing: Shower Upper Body Dressing: Supervision/safety Where Assessed-Upper Body Dressing: Chair Lower Body Dressing: Minimal assistance Where Assessed-Lower Body Dressing: Chair Toileting: close S Where Assessed-Toileting: Toilet Toilet Transfer: Close supervision Toilet Transfer Method: Ambulating Toilet Transfer Equipment: Grab bars, Raised toilet seat Tub/Shower Transfer: Close supervison Tub/Shower Transfer Method: Ambulating Tub/Shower Equipment: Transfer tub bench  Vision Baseline Vision/History: Wears  glasses Wears Glasses: Reading only Tracking/Visual Pursuits: Decreased smoothness of horizontal tracking;Decreased smoothness of vertical tracking Visual Fields: No apparent deficits Perception  Perception: Impaired Inattention/Neglect: Does not attend to right side of body;Does not attend to right visual field Praxis Praxis: Impaired Praxis Impairment Details: Motor planning Cognition Overall Cognitive Status: Impaired/Different from baseline Arousal/Alertness: Awake/alert Orientation Level: Oriented X4 Attention: Selective Selective Attention: Impaired Selective Attention Impairment: Verbal complex;Functional complex Memory: Impaired Memory Impairment: Decreased recall of new information Awareness: Impaired Awareness Impairment: Anticipatory impairment Problem Solving: Impaired Problem Solving Impairment: Verbal complex;Functional complex Initiating: Impaired Safety/Judgment: Impaired Comments: decreased environmental awareness Sensation Sensation Light Touch: Appears Intact Coordination Gross Motor Movements are Fluid and Coordinated: No Fine Motor Movements are Fluid and Coordinated: No Motor  Motor Motor: Hemiplegia Motor - Discharge Observations: Mild R hemi remains, though improved Mobility  Bed Mobility Bed Mobility: Rolling Right;Rolling Left;Supine to Sit;Sit to Supine Rolling Right: Supervision/verbal cueing Rolling Left: Supervision/Verbal cueing Supine to Sit: Supervision/Verbal cueing Sit to Supine: Supervision/Verbal cueing Transfers Sit to Stand: Supervision/Verbal cueing Stand to Sit: Supervision/Verbal cueing  Trunk/Postural Assessment  Cervical Assessment Cervical Assessment: Exceptions to WFL(forward head) Thoracic Assessment Thoracic Assessment: Within Functional Limits Lumbar Assessment Lumbar Assessment: Exceptions to WFL(posterior pelvic tilt) Postural Control Postural Control: Within Functional Limits  Balance Balance Balance  Assessed: Yes Static Sitting Balance Static Sitting - Balance Support: Feet supported;No upper extremity supported Static Sitting - Level of Assistance: 5: Stand by assistance Dynamic Sitting Balance Dynamic Sitting - Balance Support: Feet supported;No upper extremity supported Dynamic Sitting - Level of Assistance: 5: Stand by assistance Static Standing Balance Static Standing - Balance Support: During functional activity;No upper extremity supported Static Standing - Level of Assistance: 5: Stand by assistance Dynamic Standing Balance Dynamic Standing - Balance Support: During functional activity;No upper extremity supported Dynamic Standing - Level of Assistance: 4: Min assist Extremity/Trunk Assessment RUE Assessment General Strength   Comments: strength 4-/5 in the shoulder, elbow, and grip LUE Assessment General Strength Comments: strength 4/5 throughout   SAGUIER,JULIA 07/24/2018, 1:44 PM 

## 2018-07-24 NOTE — Progress Notes (Addendum)
[facility] PHYSICAL MEDICINE & REHABILITATION PROGRESS NOTE   Subjective/Complaints: No issues overnite  ROS  Neg for CP, SOB, N/V/D, or body aches or pains  Objective:   No results found. No results for input(s): WBC, HGB, HCT, PLT in the last 72 hours. No results for input(s): NA, K, CL, CO2, GLUCOSE, BUN, CREATININE, CALCIUM in the last 72 hours.  Intake/Output Summary (Last 24 hours) at 07/24/2018 0843 Last data filed at 07/24/2018 0700 Gross per 24 hour  Intake 362 ml  Output -  Net 362 ml     Physical Exam: Vital Signs Blood pressure (!) 160/89, pulse (!) 56, temperature (!) 97 F (36.1 C), temperature source Oral, resp. rate 16, height 5\' 2"  (1.575 m), weight 86.4 kg, SpO2 97 %.   General: No acute distress Mood and affect are appropriate Heart: Regular rate and rhythm no rubs murmurs or extra sounds Lungs: Clear to auscultation, breathing unlabored, neg wheezes  Abdomen: Positive bowel sounds, soft nontender to palpation, nondistended Extremities: No clubbing, cyanosis, or edema Skin: No evidence of breakdown, no evidence of rash Neurologic: Cranial nerves II through XII intact, motor strength is 5/5 in Left and 4/5 on Right  deltoid, bicep, tricep, grip, hip flexor, knee extensors, ankle dorsiflexor and plantar flexor Sensory exam normal sensation to light touch and proprioception in bilateral upper and lower extremities Cerebellar exam normal finger to nose to finger as well as heel to shin in bilateral upper and lower extremities Musculoskeletal: Full range of motion in all 4 extremities. No joint swelling   Assessment/Plan: 1. Functional deficits secondary to Left MCA infarct  which require 3+ hours per day of interdisciplinary therapy in a comprehensive inpatient rehab setting.  Physiatrist is providing close team supervision and 24 hour management of active medical problems listed below.  Physiatrist and rehab team continue to assess barriers to  discharge/monitor patient progress toward functional and medical goals  Care Tool:  Bathing    Body parts bathed by patient: Right arm, Left arm, Chest, Abdomen, Right upper leg, Left upper leg, Right lower leg, Left lower leg, Face, Front perineal area, Buttocks   Body parts bathed by helper: Buttocks Body parts n/a: Buttocks, Front perineal area(did not attempt secondary to female therapist)   Bathing assist Assist Level: Contact Guard/Touching assist     Upper Body Dressing/Undressing Upper body dressing   What is the patient wearing?: Pull over shirt    Upper body assist Assist Level: Supervision/Verbal cueing    Lower Body Dressing/Undressing Lower body dressing      What is the patient wearing?: Incontinence brief, Pants     Lower body assist Assist for lower body dressing: Minimal Assistance - Patient > 75%     Toileting Toileting    Toileting assist Assist for toileting: Moderate Assistance - Patient 50 - 74%     Transfers Chair/bed transfer  Transfers assist  Chair/bed transfer activity did not occur: Safety/medical concerns  Chair/bed transfer assist level: Contact Guard/Touching assist     Locomotion Ambulation   Ambulation assist      Assist level: Contact Guard/Touching assist Assistive device: Walker-rolling Max distance: 100'   Walk 10 feet activity   Assist     Assist level: Contact Guard/Touching assist Assistive device: Walker-rolling   Walk 50 feet activity   Assist    Assist level: Contact Guard/Touching assist Assistive device: Walker-rolling    Walk 150 feet activity   Assist Walk 150 feet activity did not occur: Safety/medical concerns  Assist  level: Minimal Assistance - Patient > 75% Assistive device: Walker-rolling    Walk 10 feet on uneven surface  activity   Assist Walk 10 feet on uneven surfaces activity did not occur: Safety/medical concerns         Wheelchair     Assist   Type of  Wheelchair: Manual    Wheelchair assist level: Contact Guard/Touching assist Max wheelchair distance: 50    Wheelchair 50 feet with 2 turns activity    Assist        Assist Level: Contact Guard/Touching assist   Wheelchair 150 feet activity     Assist Wheelchair 150 feet activity did not occur: Safety/medical concerns        Medical Problem List and Plan: 1.Right side weakness with facial droop and aphasiasecondary to left MCA infarction CIR PT, OT plan d/c in am 2. DVT Prophylaxis/Anticoagulation: Eliquis 3. Pain Management:Tylenol as needed 4. Mood:Zoloft 25 mg daily 5. Neuropsych: This patientiscapable of making decisions on herown behalf. 6. Skin/Wound Care:Routine skin checks 7. Fluids/Electrolytes/Nutrition:I 652ml on 11/28BMET normal 11/29, hold off on IV fluid supplementation    8. Dysphagia. Dysphagia #1 pudding thick liquids. Upgrade to D2 nectar on 11/27 Monitor hydration. Follow-up speech therapy I 360ml yesterday- 9. Hypertension. Toprol 25mg , HR running low will reduce Vitals:   07/23/18 2043 07/24/18 0356  BP: (!) 158/79 (!) 160/89  Pulse: 61 (!) 56  Resp: 17 16  Temp: 97.7 F (36.5 C) (!) 97 F (36.1 C)  SpO2: 97% 97%  BP mild elevation increase capoten  to TID  - may need further adjustment as OP 10. Atrial fibrillation. Lanoxin 0.125 mg daily. Cardiac rate controlled- check dig level 11.COPD.pt states she has not received inhaler this am - on Singulair 10mg  qhs, Pulmicort .5 BID neb, prn albuterol  12.Chronic systolic congestive heart failure.Monitor for any signs of fluid overload, no peripheral edema or rales  -daily weights ordered, check CXR given wheezes and IVF at noc, CXR shows no pulm edema, also has no LE edema 13. Hyperlipidemia. Pravachol 14. Pre-diabetes. Hemoglobin A1c 5.6. SSI  15.  Appears more confused over the last several days likely UTI, afebrile, + UA Cx shows multi organisms likely  contamination- recheck cath UA neg C and S  Shows low colongy count pseudomonas likely colonization no fever  , continue Keflex course  LOS: 14 days A FACE TO FACE EVALUATION WAS PERFORMED  Charlett Blake 07/24/2018, 8:43 AM

## 2018-07-24 NOTE — Progress Notes (Signed)
Occupational Therapy Note  Patient Details  Name: [name] MRN: [id number] Date of Birth: 1940-11-05  Today's Date: 07/24/2018 OT Missed Time: 110 Minutes Missed Time Reason: Patient fatigue  Pt missed 30 mins skilled OT services.  Pt asleep upon arrival and when aroused pt stated she was too tired for any more therapy.    Leotis Shames [facility] 07/24/2018, 1:48 PM

## 2018-07-24 NOTE — Discharge Summary (Signed)
Discharge summary job [id number]

## 2018-07-24 NOTE — Discharge Instructions (Signed)
Inpatient Rehab Discharge Instructions  Kaitlyn Underwood Discharge date and time: No discharge date for patient encounter.   Activities/Precautions/ Functional Status: Activity: activity as tolerated Diet: dysphagia #2 thin liquids Wound Care: none needed Functional status:  ___ No restrictions     ___ Walk up steps independently ___ 24/7 supervision/assistance   ___ Walk up steps with assistance ___ Intermittent supervision/assistance  ___ Bathe/dress independently ___ Walk with walker     _x STROKE/TIA DISCHARGE INSTRUCTIONS SMOKING Cigarette smoking nearly doubles your risk of having a stroke & is the single most alterable risk factor  If you smoke or have smoked in the last 12 months, you are advised to quit smoking for your health.  Most of the excess cardiovascular risk related to smoking disappears within a year of stopping.  Ask you doctor about anti-smoking medications  [place] Quit Line: 1-800-QUIT NOW  Free Smoking Cessation Classes [phone]  CHOLESTEROL Know your levels; limit fat & cholesterol in your diet  Lipid Panel     Component Value Date/Time   CHOL 146 07/06/2018 0704   CHOL 189 05/12/2018 1212   TRIG 124 07/06/2018 0704   HDL 49 07/06/2018 0704   HDL 63 05/12/2018 1212   CHOLHDL 3.0 07/06/2018 0704   VLDL 25 07/06/2018 0704   LDLCALC 72 07/06/2018 0704   LDLCALC 103 (H) 05/12/2018 1212      Many patients benefit from treatment even if their cholesterol is at goal.  Goal: Total Cholesterol (CHOL) less than 160  Goal:  Triglycerides (TRIG) less than 150  Goal:  HDL greater than 40  Goal:  LDL (LDLCALC) less than 100   BLOOD PRESSURE American Stroke Association blood pressure target is less that 120/80 mm/Hg  Your discharge blood pressure is:  BP: (!) 151/64  Monitor your blood pressure  Limit your salt and alcohol intake  Many individuals will require more than one medication for high blood pressure  DIABETES (A1c is a blood sugar average for  last 3 months) Goal HGBA1c is under 7% (HBGA1c is blood sugar average for last 3 months)  Diabetes: No known diagnosis of diabetes    Lab Results  Component Value Date   HGBA1C 5.6 07/06/2018     Your HGBA1c can be lowered with medications, healthy diet, and exercise.  Check your blood sugar as directed by your physician  Call your physician if you experience unexplained or low blood sugars.  PHYSICAL ACTIVITY/REHABILITATION Goal is 30 minutes at least 4 days per week  Activity: Increase activity slowly, Therapies: Physical Therapy: Home Health Return to work:   Activity decreases your risk of heart attack and stroke and makes your heart stronger.  It helps control your weight and blood pressure; helps you relax and can improve your mood.  Participate in a regular exercise program.  Talk with your doctor about the best form of exercise for you (dancing, walking, swimming, cycling).  DIET/WEIGHT Goal is to maintain a healthy weight  Your discharge diet is:  Diet Order            DIET - DYS 1 Room service appropriate? Yes; Fluid consistency: Pudding Thick  Diet effective now              liquids Your height is:  Height: 5\' 2"  (157.5 cm) Your current weight is: Weight: 90.4 kg Your Body Mass Index (BMI) is:  BMI (Calculated): 36.44  Following the type of diet specifically designed for you will help prevent another stroke.  Your goal weight range is:    Your goal Body Mass Index (BMI) is 19-24.  Healthy food habits can help reduce 3 risk factors for stroke:  High cholesterol, hypertension, and excess weight.  RESOURCES Stroke/Support Group:  Call [phone]   STROKE EDUCATION PROVIDED/REVIEWED AND GIVEN TO PATIENT Stroke warning signs and symptoms How to activate emergency medical system (call 911). Medications prescribed at discharge. Need for follow-up after discharge. Personal risk factors for stroke. Pneumonia vaccine given:  Flu vaccine given:  My questions have  been answered, the writing is legible, and I understand these instructions.  I will adhere to these goals & educational materials that have been provided to me after my discharge from the hospital.   __ Bathe/dress with assistance ___ Walk Independently    ___ Shower independently ___ Walk with assistance    ___ Shower with assistance ___ No alcohol     ___ Return to work/school ________  COMMUNITY REFERRALS UPON DISCHARGE:   Home Health:   PT     OT     ST     RN  Agency:  [place] Phone:  [phone] Medical Equipment/Items Ordered:  Bedside commode and tub transfer bench  Agency/Supplier:  [employer]       Phone:  [phone]  GENERAL COMMUNITY RESOURCES FOR PATIENT/FAMILY: Support Groups:  [facility] Stroke Support Group                              Meets the second Thursday of each month from 6 - 7 PM (except June, July, August)                              The . [facility], [address], [place]                              For more information, call [phone]                               [facility] Stroke and Brain Injury Support Group                              Meets the second Thursday of each month at [address] [place], [place], [place]  [address]                              For more information and to register, call Marylyn Ishihara [phone]  Special Instructions:    My questions have been answered and I understand these instructions. I will adhere to these goals and the provided educational materials after my discharge from the hospital.  Patient/Caregiver Signature _______________________________ Date __________  Clinician Signature _______________________________________ Date __________  Please bring this form and your medication list with you to all your follow-up doctor's appointments. Information on my medicine - ELIQUIS  (apixaban)  Why was Eliquis prescribed for you? Eliquis was prescribed for you to reduce the risk of  a blood clot forming that can cause a stroke if you have a medical condition called atrial fibrillation (a type of irregular heartbeat).  What do You need to know about Eliquis ? Take your Eliquis TWICE DAILY - one tablet in the morning and one tablet in the evening with or without food. If you have difficulty swallowing the tablet whole please discuss with your pharmacist how to take the medication safely.  Take Eliquis exactly as prescribed by your doctor and DO NOT stop taking Eliquis without talking to the doctor who prescribed the medication.  Stopping may increase your risk of developing a stroke.  Refill your prescription before you run out.  After discharge, you should have regular check-up appointments with your healthcare provider that is prescribing your Eliquis.  In the future your dose may need to be changed if your kidney function or weight changes by a significant amount or as you get older.  What do you do if you miss a dose? If you miss a dose, take it as soon as you remember on the same day and resume taking twice daily.  Do not take more than one dose of ELIQUIS at the same time to make up a missed dose.  Important Safety Information A possible side effect of Eliquis is bleeding. You should call your healthcare provider right away if you experience any of the following: ? Bleeding from an injury or your nose that does not stop. ? Unusual colored urine (red or dark brown) or unusual colored stools (red or black). ? Unusual bruising for unknown reasons. ? A serious fall or if you hit your head (even if there is no bleeding).  Some medicines may interact with Eliquis and might increase your risk of bleeding or clotting while on Eliquis. To help avoid this, consult your healthcare provider or pharmacist prior to using any new prescription or non-prescription medications,  including herbals, vitamins, non-steroidal anti-inflammatory drugs (NSAIDs) and supplements.  This website has more information on Eliquis (apixaban): http://www.eliquis.com/eliquis/home

## 2018-07-24 NOTE — Plan of Care (Signed)
  Problem: Consults Goal: RH STROKE PATIENT EDUCATION Description See Patient Education module for education specifics  Outcome: Progressing Goal: Nutrition Consult-if indicated Outcome: Progressing Goal: Diabetes Guidelines if Diabetic/Glucose > 140 Description If diabetic or lab glucose is > 140 mg/dl - Initiate Diabetes/Hyperglycemia Guidelines & Document Interventions  Outcome: Progressing   Problem: RH BOWEL ELIMINATION Goal: RH STG MANAGE BOWEL WITH ASSISTANCE Description STG Manage Bowel with Mod I Assistance.  Outcome: Progressing Goal: RH STG MANAGE BOWEL W/MEDICATION W/ASSISTANCE Description STG Manage Bowel with Medication with Mod I Assistance.  Outcome: Progressing   Problem: RH BLADDER ELIMINATION Goal: RH STG MANAGE BLADDER WITH ASSISTANCE Description STG Manage Bladder With Mod I Assistance  Outcome: Progressing Goal: RH STG MANAGE BLADDER WITH EQUIPMENT WITH ASSISTANCE Description STG Manage Bladder With Equipment With Mod I Assistance  Outcome: Progressing   Problem: RH SAFETY Goal: RH STG ADHERE TO SAFETY PRECAUTIONS W/ASSISTANCE/DEVICE Description STG Adhere to Safety Precautions With Mod I Assistance/Device.  Outcome: Progressing Goal: RH STG DECREASED RISK OF FALL WITH ASSISTANCE Description STG Decreased Risk of Fall With Mod I Assistance.  Outcome: Progressing   Problem: RH KNOWLEDGE DEFICIT Goal: RH STG INCREASE KNOWLEDGE OF DIABETES Description With Min Assistance  Outcome: Progressing Goal: RH STG INCREASE KNOWLEDGE OF HYPERTENSION Description With Min Assistance   Outcome: Progressing Goal: RH STG INCREASE KNOWLEDGE OF DYSPHAGIA/FLUID INTAKE Description With Mod I Assistance   Outcome: Progressing Goal: RH STG INCREASE KNOWLEGDE OF HYPERLIPIDEMIA Description With Min Assistance   Outcome: Progressing Goal: RH STG INCREASE KNOWLEDGE OF STROKE PROPHYLAXIS Description With Min Assistance   Outcome: Progressing

## 2018-07-24 NOTE — Progress Notes (Signed)
Occupational Therapy Session Note  Patient Details  Name: Kaitlyn Underwood MRN: [id number] Date of Birth: Mar 10, 1941  Today's Date: 07/24/2018 OT Individual Time: 1115-1200 OT Individual Time Calculation (min): 45 min    Short Term Goals: Week 2:  OT Short Term Goal 1 (Week 2): STGs = LTGs  Skilled Therapeutic Interventions/Progress Updates:    Pt seen this session for family education with pt's 3 daughters.  Due to time constraints, pt demonstrated toilet transfers, tub transfers, and dressing but no bathing today.  Her one daughter assisted her with a shower the other night so she was familiar with the fact that the pt only needs CGA when standing.   Discussed with daughters her motor planning challenges with transfers and mobility and how to cue and guide her.  Pt continues to need cues with UB dressing and min A LB dressing.  Worked on safe sit >< stand techniques.  Tub bench transfers practiced in ADL apt to simulate their home set up.  Daughters want to purchase a tub bench.  They observed and participated in assisting their mom and feel competent to take her home.    Therapy Documentation Precautions:  Precautions Precautions: Fall Precaution Comments: expressive aphasia Restrictions Weight Bearing Restrictions: No  Pain: Pain Assessment Pain Scale: 0-10 Pain Score: 0-No pain ADL: ADL Eating: Supervision/safety Where Assessed-Eating: Wheelchair Grooming: Supervision/safety Where Assessed-Grooming: Wheelchair Upper Body Bathing: Supervision/safety Where Assessed-Upper Body Bathing: Shower Lower Body Bathing: Contact guard Where Assessed-Lower Body Bathing: Shower Upper Body Dressing: Supervision/safety Where Assessed-Upper Body Dressing: Chair Lower Body Dressing: Minimal assistance Where Assessed-Lower Body Dressing: Chair Toileting: Minimal assistance Where Assessed-Toileting: Glass blower/designer: Moderate assistance Toilet Transfer Method: Air traffic controller: Energy manager: Moderate assistance Social research officer, government Method: Radiographer, therapeutic: Grab bars, Transfer tub bench   Therapy/Group: Individual Therapy  [place] 07/24/2018, 1:28 PM

## 2018-07-24 NOTE — Discharge Summary (Signed)
NAME: Kaitlyn Underwood, Kaitlyn Underwood MEDICAL RECORD [id number] ACCOUNT [id number] DATE OF BIRTH:10-14-1940 FACILITY: [facility] LOCATION: [facility] PHYSICIAN:ANDREW Letta Pate, MD  DISCHARGE SUMMARY  DATE OF DISCHARGE:  07/25/2018  ADMISSION DATE:  07/10/2018  DISCHARGE DATE:  07/25/2018  DISCHARGE DIAGNOSES: 1.  Left artery infarction.   2.  Deep venous thrombosis prophylaxis with Eliquis.   3.  Mood. 4.  Dysphagia. 5.  Hypertension. 6.  Atrial fibrillation. 7.  Chronic obstructive pulmonary disease.  8.  Chronic systolic congestive heart failure. 9.  Hyperlipidemia. 10.  Prediabetes.  HOSPITAL COURSE:  A 77 year old right-handed female with history of hypertension, prediabetes, atrial fibrillation, maintained on Lanoxin chronic systolic congestive heart failure, COPD, CAD maintained on aspirin.  Lives with spouse, independent prior to  admission.  Presented to [facility] on 07/06/2018 with right-sided weakness and aphasia.  CT and CTA outside hospital showed occlusion of substantial vessel in the left MCA distribution.  The patient did not receive tPA and was transferred to  [facility] for further evaluation.  MRI of the brain showed small to moderate evolving acute left MCA territory infarction with associated petechial hemorrhage without frank hemorrhagic transformation.  Negative MRA.  Underwent revascularization  thrombectomy per interventional radiology.  EEG negative for seizure.  Echocardiogram with ejection fraction of 30%, diffuse hypokinesis.  Maintained on a dysphagia #1 pudding thick liquid diet.  Currently, maintained on Eliquis for CVA prophylaxis.   The patient was admitted for comprehensive rehabilitation program.  PAST MEDICAL HISTORY:  See discharge diagnoses.  SOCIAL HISTORY:  Lives with spouse, independent prior to admission.  FUNCTIONAL STATUS:  Upon admission to rehab services was +2 physical assist sit to stand, +2 physical assist, stand pivot transfers, mod/max  assist with activities of daily living.  PHYSICAL EXAMINATION: VITAL SIGNS:  Blood pressure 158/82, pulse 58, temperature 98, respirations 17. GENERAL:  Alert female, oriented to person and place.  Able to name simple objects with extra times due to expressive aphasia. HEENT:  EOMs intact. NECK:  Supple, nontender, no JVD. CARDIOVASCULAR:  Rate controlled. ABDOMEN:  Soft, nontender, good bowel sounds. LUNGS:  Clear to auscultation without wheeze.  REHABILITATION HOSPITAL COURSE:  The patient was admitted to inpatient rehabilitation services.  Therapies initiated on a 3-hour daily basis, consisting of physical therapy, occupational therapy, speech therapy and rehabilitation nursing.  The following  issues were addressed during patient's rehabilitation stay.  Pertaining to the patient's left MCA infarction, she would remain on Eliquis for CVA prophylaxis.  Follow up neurology services.  Her diet had been advanced to a dysphagia #2 thin liquid diet.   No signs of aspiration.  Blood pressure, heart rate controlled.  The patient remained on Capoten, Lanoxin as well as Lopressor.  No chest pain or shortness of breath.  She exhibited no other signs of fluid overload and her home Lasix was resumed.  Prediabetes, hemoglobin A1c 5.6.   Diabetic teaching as noted.  A urine culture did show 30,000 pseudomonas.  She was completing a course of Keflex.  She remained afebrile.  No hematuria noted.  The patient received weekly collaborative interdisciplinary team conferences to discuss  estimated length of stay, family teaching, any barriers to discharge.  She transferred edge of bed supervision was able to put on her socks and pants needing total assistance for time management.  Total assist with transport from the rehabilitation gym  with energy conservation techniques.  Ambulates 100 feet x2 rolling walker, contact guard assist.  Additionally, ambulating household environment by weaving through cones and  tight  spaces.  She was able to communicate her needs.  Followup for speech  therapy.  Sessions focused on communication.  DISCHARGE MEDICATIONS:  Included Eliquis 5 mg p.o. b.i.d., Capoten 12.5 mg p.o. t.i.d., Keflex 250 mg q.8 hours to complete course for UTI, Lanoxin 0.125 mg p.o. daily, Lopressor 12.5 mg p.o. b.i.d., Singulair 10 mg p.o. at bedtime, Protonix 40 mg p.o.  Daily,Lasix 40 mg daily, Pravachol 20 mg p.o. daily, Zoloft 25 mg p.o. daily, Tylenol as needed.  DIET:  Dysphagia #2 thin liquids.  FOLLOWUP:  The patient would follow up with Dr. Alysia Penna at the outpatient rehab center as advised; Dr. Erlinda Hong of neurology services, call for appointment; Dr. Estanislado Pandy, interventional radiology call for appointment after thrombectomy; Dr. Nelda Bucks, medical management.  TN/NUANCE D:07/24/2018 T:07/24/2018 JOB:[id number]

## 2018-07-24 NOTE — Progress Notes (Signed)
Speech Language Pathology Discharge Summary  Patient Details  Name: Kaitlyn Underwood MRN: [id number] Date of Birth: 22-Jun-1941  Today's Date: 07/24/2018 SLP Individual Time: 1000-1100 SLP Individual Time Calculation (min): 60 min   Skilled Therapeutic Interventions:  Skilled treatment session foucsed on completing educaiton wiht pt's daughters. Education provided on expressive/receptive language, cognitive funciton and dysphagia. Information shared, handouts given, examples of cues provided and list of appropriate food items provided. All questions answered at this time.  Patient has met 5 of 5 long term goals.  Patient to discharge at overall Supervision;Min level.    Clinical Impression/Discharge Summary:   Pt continues to experience non-fluent expressive aphasia c/b phonemic and semantic paraphasics. Pt required Mod A semantic cues to facilitate word finding deficits during conversation about her children. Pt's daughters able to return demonstrate use of strategies. Pt continues to require skilled ST at discharge to target expressive, receptive and dysphagia goals to increase pt's ability to express her wants/needs.    Care Partner:  Caregiver Able to Provide Assistance: Yes  Type of Caregiver Assistance: Physical;Cognitive  Recommendation:  Outpatient SLP;Home Health SLP;24 hour supervision/assistance  Rationale for SLP Follow Up: Maximize functional communication;Maximize cognitive function and independence;Reduce caregiver burden;Maximize swallowing safety   Equipment:     Reasons for discharge: Treatment goals met;Discharged from hospital   Patient/Family Agrees with Progress Made and Goals Achieved: Yes    Kadia Abaya 07/24/2018, 12:28 PM

## 2018-07-24 NOTE — Progress Notes (Signed)
Physical Therapy Discharge Summary  Patient Details  Name: Kaitlyn Underwood MRN: [id number] Date of Birth: Jan 29, 1941  Today's Date: 07/24/2018 PT Individual Time: 0900-1000 PT Individual Time Calculation (min): 60 min   Pt in supine and agreeable to therapy, denies pain. Pt's 3 daughters present, they will be the primary children providing care for pt. Performed functional mobility as detailed below including bed mobility, transfers, gait, stairs, and car transfer w/ supervision assist. Family members report that pt appears to be at her baseline w/ exception of perceptual deficits, cognition, and overall deconditioning and global weakness. All feel comfortable w/ caring for their mother at her current functional level. Specifically discussed energy conservation strategies and the occasional tactile and verbal cues needed for upright posture, R attention, and RW management w/ upright mobility. All verbalized understanding and in agreement. Intermittent seated rest breaks given 2/2 fatigue and increased work of breathing w/ all functional activity. Returned to room and ended session in w/c and in care of family, all needs met.   Patient has met 5 of 9 long term goals due to improved activity tolerance, improved balance, improved postural control, increased strength, ability to compensate for deficits, improved attention, improved awareness and improved coordination.  Patient to discharge at an ambulatory level Supervision.   Patient's care partner is independent to provide the necessary physical assistance at discharge. Pt has 3 daughters who will primarily be providing care for her and all have been trained on providing supervision to CGA level of care. Pt's husband was not present for scheduled family education.  Reasons goals not met: Pt continues to require 24/7 supervision w/ all functional mobility 2/2 impaired cognition and decreased safety awareness. Dynamic standing balance tasks including stairs  continue to require CGA for safety, per family this is her baseline.   Recommendation:  Patient will benefit from ongoing skilled PT services in home health setting to continue to advance safe functional mobility, address ongoing impairments in functional strength, balance, endurance, and safety awareness, and minimize fall risk.  Equipment: No equipment provided (pt already has RW and w/c)   Reasons for discharge: treatment goals met and discharge from hospital  Patient/family agrees with progress made and goals achieved: Yes  PT Discharge Precautions/Restrictions Precautions Precautions: Fall Restrictions Weight Bearing Restrictions: No Vital Signs Therapy Vitals Pulse Rate: 65 Resp: 17 Patient Position (if appropriate): Lying Oxygen Therapy SpO2: 95 % O2 Device: Room Air Pain Pain Assessment Pain Scale: 0-10 Pain Score: 0-No pain Vision/Perception  Perception Perception: Impaired Inattention/Neglect: Does not attend to right side of body;Does not attend to right visual field Praxis Praxis: Impaired Praxis Impairment Details: Motor planning  Cognition Overall Cognitive Status: Impaired/Different from baseline Arousal/Alertness: Awake/alert Orientation Level: Oriented to place;Oriented to situation;Oriented to person;Oriented to time Memory: Impaired Memory Impairment: Decreased recall of new information Awareness: Impaired Problem Solving: Impaired Initiating: Impaired Safety/Judgment: Impaired Comments: decreased environmental awareness Sensation Sensation Light Touch: Appears Intact Coordination Gross Motor Movements are Fluid and Coordinated: No Fine Motor Movements are Fluid and Coordinated: No Motor  Motor Motor: Hemiplegia Motor - Discharge Observations: Mild R hemi remains, though improved  Mobility Bed Mobility Bed Mobility: Rolling Right;Rolling Left;Supine to Sit;Sit to Supine Rolling Right: Supervision/verbal cueing Rolling Left:  Supervision/Verbal cueing Supine to Sit: Supervision/Verbal cueing Sit to Supine: Supervision/Verbal cueing Transfers Transfers: Sit to Stand;Stand to Sit;Stand Pivot Transfers Sit to Stand: Supervision/Verbal cueing Stand to Sit: Supervision/Verbal cueing Stand Pivot Transfers: Supervision/Verbal cueing Stand Pivot Transfer Details: Verbal cues for safe use of DME/AE  Transfer (Assistive device): Manufacturing systems engineer Ambulation: Yes Gait Assistance: Supervision/Verbal cueing Gait Distance (Feet): 150 Feet Assistive device: Rolling walker Gait Assistance Details: Tactile cues for posture;Tactile cues for placement;Verbal cues for safe use of DME/AE;Verbal cues for precautions/safety Gait Assistance Details: needed multiple cues for obstacle avoidance on R side  Gait Gait: Yes Gait Pattern: Impaired Gait Pattern: Decreased step length - left;Decreased stance time - right;Decreased hip/knee flexion - right;Decreased dorsiflexion - right;Decreased weight shift to right;Narrow base of support;Step-to pattern;Right flexed knee in stance;Decreased trunk rotation Gait velocity: decreased Stairs / Additional Locomotion Stairs: Yes Stairs Assistance: Contact Guard/Touching assist Stair Management Technique: Two rails Number of Stairs: 4 Height of Stairs: 6 Wheelchair Mobility Wheelchair Mobility: No(pt will be primary ambulator in household environment, 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 and has family to propel w/c for her)  Trunk/Postural Assessment  Cervical Assessment Cervical Assessment: Exceptions to WFL(forward head) Thoracic Assessment Thoracic Assessment: Within Functional Limits Lumbar Assessment Lumbar Assessment: Exceptions to WFL(posterior pelvic tilt) Postural Control Postural Control: Within Functional Limits  Balance Balance Balance Assessed: Yes Static Sitting Balance Static Sitting - Balance Support: Feet supported;No upper extremity  supported Static Sitting - Level of Assistance: 5: Stand by assistance Dynamic Sitting Balance Dynamic Sitting - Balance Support: Feet supported;No upper extremity supported Dynamic Sitting - Level of Assistance: 5: Stand by assistance Static Standing Balance Static Standing - Balance Support: During functional activity;No upper extremity supported Static Standing - Level of Assistance: 5: Stand by assistance Dynamic Standing Balance Dynamic Standing - Balance Support: During functional activity;No upper extremity supported Dynamic Standing - Level of Assistance: 4: Min assist Extremity Assessment      RLE Assessment RLE Assessment: Exceptions to [facility] General Strength Comments: globally 4/5 LLE Assessment LLE Assessment: Exceptions to [facility] General Strength Comments: globally 4/5    Charna Neeb K Naiomi Musto 07/24/2018, 12:25 PM

## 2018-07-24 NOTE — Progress Notes (Signed)
Ok to stop keflex for UTI per Dr. Read Drivers. Pseudomonas doesn't warranted treatment.   Onnie Boer, PharmD, BCIDP, AAHIVP, CPP Infectious Disease Pharmacist 07/24/2018 9:23 AM

## 2018-07-25 LAB — GLUCOSE, CAPILLARY
Glucose-Capillary: 91 mg/dL (ref 70–99)
Glucose-Capillary: 124 mg/dL — ABNORMAL HIGH (ref 70–99)

## 2018-07-25 NOTE — Progress Notes (Signed)
[place] PHYSICAL MEDICINE & REHABILITATION PROGRESS NOTE   Subjective/Complaints: No issues overnite, pt is aware of d/c date  ROS  Neg for CP, SOB, N/V/D, or body aches or pains  Objective:   No results found. No results for input(s): WBC, HGB, HCT, PLT in the last 72 hours. No results for input(s): NA, K, CL, CO2, GLUCOSE, BUN, CREATININE, CALCIUM in the last 72 hours.  Intake/Output Summary (Last 24 hours) at 07/25/2018 1000 Last data filed at 07/25/2018 0857 Gross per 24 hour  Intake 140 ml  Output -  Net 140 ml     Physical Exam: Vital Signs Blood pressure 122/60, pulse 82, temperature 97.6 F (36.4 C), temperature source Oral, resp. rate 16, height 5\' 2"  (1.575 m), weight 86.4 kg, SpO2 94 %.   General: No acute distress Mood and affect are appropriate Heart: Regular rate and rhythm no rubs murmurs or extra sounds Lungs: Clear to auscultation, breathing unlabored, neg wheezes  Abdomen: Positive bowel sounds, soft nontender to palpation, nondistended Extremities: No clubbing, cyanosis, or edema Skin: No evidence of breakdown, no evidence of rash Neurologic: Cranial nerves II through XII intact, motor strength is 5/5 in Left and 4/5 on Right  deltoid, bicep, tricep, grip, hip flexor, knee extensors, ankle dorsiflexor and plantar flexor Sensory exam normal sensation to light touch and proprioception in bilateral upper and lower extremities Cerebellar exam normal finger to nose to finger as well as heel to shin in bilateral upper and lower extremities Musculoskeletal: Full range of motion in all 4 extremities. No joint swelling   Assessment/Plan: 1. Functional deficits secondary to Left MCA infarct  Stable for D/C today F/u PCP in 3-4 weeks F/u PM&R 2 weeks See D/C summary See D/C instructionsCare Tool:  Bathing    Body parts bathed by patient: Right arm, Left arm, Chest, Abdomen, Right upper leg, Left upper leg, Right lower leg, Left lower leg, Face, Front  perineal area, Buttocks   Body parts bathed by helper: Buttocks Body parts n/a: Buttocks, Front perineal area(did not attempt secondary to female therapist)   Bathing assist Assist Level: Contact Guard/Touching assist     Upper Body Dressing/Undressing Upper body dressing   What is the patient wearing?: Pull over shirt    Upper body assist Assist Level: Supervision/Verbal cueing    Lower Body Dressing/Undressing Lower body dressing      What is the patient wearing?: Incontinence brief, Pants     Lower body assist Assist for lower body dressing: Minimal Assistance - Patient > 75%     Toileting Toileting    Toileting assist Assist for toileting: Moderate Assistance - Patient 50 - 74%     Transfers Chair/bed transfer  Transfers assist  Chair/bed transfer activity did not occur: Safety/medical concerns  Chair/bed transfer assist level: Supervision/Verbal cueing     Locomotion Ambulation   Ambulation assist      Assist level: Supervision/Verbal cueing Assistive device: Walker-rolling Max distance: 150'   Walk 10 feet activity   Assist     Assist level: Supervision/Verbal cueing Assistive device: Walker-rolling   Walk 50 feet activity   Assist    Assist level: Supervision/Verbal cueing Assistive device: Walker-rolling    Walk 150 feet activity   Assist Walk 150 feet activity did not occur: Safety/medical concerns  Assist level: Supervision/Verbal cueing Assistive device: Walker-rolling    Walk 10 feet on uneven surface  activity   Assist Walk 10 feet on uneven surfaces activity did not occur: Safety/medical concerns  Wheelchair     Assist Will patient use wheelchair at discharge?: No(pt is primarily a household ambulator and has family available to propel w/c in community as they were already doing at baseline) Type of Wheelchair: Educational psychologist activity did not occur: N/A  Wheelchair assist level: Contact  Guard/Touching assist Max wheelchair distance: 59    Wheelchair 50 feet with 2 turns activity    Assist    Wheelchair 50 feet with 2 turns activity did not occur: N/A   Assist Level: Contact Guard/Touching assist   Wheelchair 150 feet activity     Assist Wheelchair 150 feet activity did not occur: N/A        Medical Problem List and Plan: 1.Right side weakness with facial droop and aphasiasecondary to left MCA infarction CIR PT, OT plan d/c today 2. DVT Prophylaxis/Anticoagulation: Eliquis 3. Pain Management:Tylenol as needed 4. Mood:Zoloft 25 mg daily 5. Neuropsych: This patientiscapable of making decisions on herown behalf. 6. Skin/Wound Care:Routine skin checks 7. Fluids/Electrolytes/Nutrition:I 632ml on 11/28BMET normal 11/29, hold off on IV fluid supplementation    8. Dysphagia. Dysphagia #1 pudding thick liquids. Upgrade to D2 nectar on 11/27 Monitor hydration. Follow-up speech therapy  9. Hypertension. Toprol 25mg , HR running low will reduce Vitals:   07/25/18 0827 07/25/18 0908  BP: 122/60   Pulse: 82   Resp: 16   Temp: 97.6 F (36.4 C)   SpO2: 95% 94%  BP mild elevation increase capoten  to TID  - controlled 10. Atrial fibrillation. Lanoxin 0.125 mg daily. Cardiac rate controlled- check dig level 11.COPD.pt states she has not received inhaler this am - on Singulair 10mg  qhs, Pulmicort .5 BID neb, prn albuterol  12.Chronic systolic congestive heart failure.Monitor for any signs of fluid overload, no peripheral edema or rales  -daily weights ordered, check CXR given wheezes and IVF at noc, CXR shows no pulm edema, also has no LE edema 13. Hyperlipidemia. Pravachol 14. Pre-diabetes. Hemoglobin A1c 5.6. SSI  15.  Appears more confused over the last several days likely UTI, afebrile, + UA Cx shows multi organisms likely contamination- recheck cath UA neg C and S  Shows low colongy count pseudomonas likely colonization no fever   , continue Keflex course  LOS: 15 days A FACE TO FACE EVALUATION WAS PERFORMED  Luanna Salk Kirsteins 07/25/2018, 10:00 AM

## 2018-07-25 NOTE — Progress Notes (Signed)
Pt d/c home with husband. No questions. Michela Pitcher that daughters handle all medications and instructions provided to them by provider yesterday.

## 2018-07-25 NOTE — Plan of Care (Signed)
Pt d/c home with husband.

## 2018-07-25 NOTE — Progress Notes (Signed)
Pt taken down, didn't want lunch. Tried to get pt to eat but refused.  Pt had reported that felt nauseous this am and felt much better. Pt still refused eat. Husband ready to go and said that would feel better once she was at home. I took pt down.  Once pt got in vehicle pt vomited approx 106ml yellow liquid. Offered to take pt back but refused.  Gave sips of ginger ale and encourage pt to eat. Pt reported feeling better.

## 2018-07-25 NOTE — Progress Notes (Signed)
Called contact [employer], LMOVM. Called [place], number is disco.. Contact Juliann Pulse called back, will be on way.

## 2018-07-25 NOTE — Plan of Care (Signed)
Pt d/c home. D/c instructions provided by PA-c. Family verbalized understanding of instructions, medications, and follow up appts.

## 2018-07-26 DIAGNOSIS — R7303 Prediabetes: Secondary | ICD-10-CM | POA: Diagnosis not present

## 2018-07-26 DIAGNOSIS — E039 Hypothyroidism, unspecified: Secondary | ICD-10-CM | POA: Diagnosis not present

## 2018-07-26 DIAGNOSIS — J449 Chronic obstructive pulmonary disease, unspecified: Secondary | ICD-10-CM | POA: Diagnosis not present

## 2018-07-26 DIAGNOSIS — F419 Anxiety disorder, unspecified: Secondary | ICD-10-CM | POA: Diagnosis not present

## 2018-07-26 DIAGNOSIS — I69351 Hemiplegia and hemiparesis following cerebral infarction affecting right dominant side: Secondary | ICD-10-CM | POA: Diagnosis not present

## 2018-07-26 DIAGNOSIS — I110 Hypertensive heart disease with heart failure: Secondary | ICD-10-CM | POA: Diagnosis not present

## 2018-07-26 DIAGNOSIS — I5022 Chronic systolic (congestive) heart failure: Secondary | ICD-10-CM | POA: Diagnosis not present

## 2018-07-26 DIAGNOSIS — I2510 Atherosclerotic heart disease of native coronary artery without angina pectoris: Secondary | ICD-10-CM | POA: Diagnosis not present

## 2018-07-26 DIAGNOSIS — Z7901 Long term (current) use of anticoagulants: Secondary | ICD-10-CM | POA: Diagnosis not present

## 2018-07-26 DIAGNOSIS — E785 Hyperlipidemia, unspecified: Secondary | ICD-10-CM | POA: Diagnosis not present

## 2018-07-26 DIAGNOSIS — Z791 Long term (current) use of non-steroidal anti-inflammatories (NSAID): Secondary | ICD-10-CM | POA: Diagnosis not present

## 2018-07-26 DIAGNOSIS — I4891 Unspecified atrial fibrillation: Secondary | ICD-10-CM | POA: Diagnosis not present

## 2018-07-27 NOTE — Progress Notes (Signed)
Social Work Patient ID: Kaitlyn Underwood, female   DOB: [birth year], 77 y.o.   MRN: [id number]   CSW met with pt 07-22-18 and then talked with pt's dtrs, Juliann Pulse and Santiago Glad, to update them on team conference discussion and to set up family education for Friday.  Reiterated that team recommends 24/7 supervision and family is working on this still.  Discussed f/u care and DME.  Pt looking forward to going home.  CSW will continue to follow and assist as needed.

## 2018-07-27 NOTE — Patient Care Conference (Signed)
Inpatient RehabilitationTeam Conference and Plan of Care Update Date: 07/22/2018   Time: 11:25 AM    Patient Name: Kaitlyn Underwood      Medical Record Number: [id number]  Date of Birth: Aug 13, 1941 Sex: Female         Room/Bed: 4W19C/4W19C-01 Payor Info: Payor: [employer]AGE / Plan: [employer] Must / Product Type: *No Product type* /    Admitting Diagnosis: L CVA  Admit Date/Time:  07/10/2018  4:06 PM Admission Comments: No comment available   Primary Diagnosis:  Left middle cerebral artery stroke [facility]) Principal Problem: Left middle cerebral artery stroke [facility])  Patient Active Problem List   Diagnosis Date Noted  . Left middle cerebral artery stroke ([place]) 07/10/2018  . Aphasia, post-stroke   . Acute ischemic left MCA stroke ([place]) 07/06/2018  . Prediabetes 07/06/2018  . Chronic systolic heart failure ([place]) 07/06/2018  . Endotracheally intubated 07/06/2018  . Hyperkalemia 07/06/2018  . Hypercalcemia 07/06/2018  . CKD (chronic kidney disease), stage III ([place]) 07/06/2018  . COPD (chronic obstructive pulmonary disease) ([place]) 07/06/2018  . Middle cerebral artery embolism, left 07/06/2018  . Bilateral carotid bruits 04/03/2017  . Deformity of toe of left foot 12/16/2016  . Acute idiopathic gout involving toe of left foot 11/18/2016  . Contracture of right ankle 03/25/2016  . Foot pain, bilateral 03/25/2016  . Paresthesia of left foot 03/25/2016  . Plantar fasciitis of right foot 03/25/2016  . Chronic atrial fibrillation 05/16/2015  . Coronary artery disease involving native coronary artery of native heart without angina pectoris 05/16/2015  . Diabetes mellitus due to underlying condition with unspecified complications ([place]) 95/28/4132  . Dyslipidemia 05/16/2015  . Essential hypertension 05/16/2015  . Carotid stenosis, left 05/16/2015  . Obesities, morbid ([place]) 05/16/2015    Expected Discharge Date: Expected Discharge Date: 07/25/18  Team Members Present: Physician  leading conference: Dr. Alysia Penna Social Worker Present: Alfonse Alpers, LCSW Nurse Present: Ellison Carwin, LPN PT Present: Michaelene Song, PT OT Present: Willeen Cass, OT;Roanna Epley, COTA SLP Present: Weston Anna, SLP PPS Coordinator present : Daiva Nakayama, RN, CRRN;Melissa Gertie Fey     Current Status/Progress Goal Weekly Team Focus  Medical   Pt with UTI causing cognitive  reduce fall risk, adequate intake  treat UTI   Bowel/Bladder   Incontinent of urine X1 LBM - 12/3  To be continent of bowel and bladder.   Toileting every 2 hours and as needed, assist with toileting needs   Swallow/Nutrition/ Hydration   Dys. 2 textures with thin liquids, supervision  Supervision  tolerance of diet upgrade   ADL's   bathing/dressing-Supervision/CGA; functional transfers-min A/CGA  S overall  BADL retraining, RUE NMR, functional transfers, education, safety awareness, activity tolerance   Mobility   CGA-min assist overall, gait w/ RW - household distances, min assist dynamic standing balance  supervision   all functional mobility, endurance and activity tolerance, d/c planning and family edu   Communication   Mod A  Min A   self-monitor and correct errors, word-finding   Safety/Cognition/ Behavioral Observations  Min A  Supervision  recall   Pain   Denies pain.  Remain free of pain.  assess for pain every shift and prn   Skin   skin tear to Right forearm, bruising noted to bilateral arms  Remain free of infection and no further breakdown  Assess skin every shift and prn, address skin issues as needed    Rehab Goals Patient on target to meet rehab goals: Yes Rehab Goals Revised: none *  See Care Plan and progress notes for long and short-term goals.     Barriers to Discharge  Current Status/Progress Possible Resolutions Date Resolved   Physician    Medical stability;Inaccessible home environment     Slow progress, cognition affected by UTI   UTI with cogniive def       Nursing                  PT  Home environment access/layout  2 steps to enter home              OT                  SLP                SW                Discharge Planning/Teaching Needs:  Pt plans to return to her home with multiple family members to provide 24/7 supervision/min A.  Family education on friday.   Team Discussion: Pt is being treated for an UTI and Dr. Letta Pate asked for another culture to make sure the antibiotic is right one for the bacteria, especially since pt's cognition is off.  Pt upgraded to thin liquids, but her language function has dropped off this week.  Pt is min A/contact guard for bathing and dressing.  Pt is contact guard for gait with PT 60-100'.  Stairs are min A and can do 4 stairs with two rails.  Revisions to Treatment Plan:  none    Continued Need for Acute Rehabilitation Level of Care: The patient requires daily medical management by a physician with specialized training in physical medicine and rehabilitation for the following conditions: Daily direction of a multidisciplinary physical rehabilitation program to ensure safe treatment while eliciting the highest outcome that is of practical value to the patient.: Yes Daily medical management of patient stability for increased activity during participation in an intensive rehabilitation regime.: Yes Daily analysis of laboratory values and/or radiology reports with any subsequent need for medication adjustment of medical intervention for : Neurological problems;Other   I attest that I was present, lead the team conference, and concur with the assessment and plan of the team.   Davit Vassar, Silvestre Mesi 07/25/2018, 4:19 PM

## 2018-07-28 NOTE — Progress Notes (Signed)
Social Work Discharge Note  The overall goal for the admission was met for:   Discharge location: Yes - home with family to care for pt  Length of Stay: Yes - 15 days  Discharge activity level: Yes - supervision with min A for stairs  Home/community participation: Yes  Services provided included: MD, RD, PT, OT, SLP, RN, Pharmacy, Neuropsych and SW  Financial Services: Private Insurance: [employer]  Follow-up services arranged: Home Health: PT/OT/ST/RN from [facility], DME: bedside commode and tub transfer bench from [place] and Patient/Family has no preference for HH/DME agencies  Comments (or additional information): 3 of pt's 4 dtrs came for family education.  They are trying to figure out a schedule for pt to have 24/7 supervision.    Patient/Family verbalized understanding of follow-up arrangements: Yes  Individual responsible for coordination of the follow-up plan: pt's dtrs  Confirmed correct DME delivered: Trey Sailors 07/28/2018    Genna Casimir, Silvestre Mesi

## 2018-08-04 DIAGNOSIS — I480 Paroxysmal atrial fibrillation: Secondary | ICD-10-CM | POA: Diagnosis not present

## 2018-08-04 DIAGNOSIS — Z79899 Other long term (current) drug therapy: Secondary | ICD-10-CM | POA: Diagnosis not present

## 2018-08-04 DIAGNOSIS — Z6831 Body mass index (BMI) 31.0-31.9, adult: Secondary | ICD-10-CM | POA: Diagnosis not present

## 2018-08-04 DIAGNOSIS — J449 Chronic obstructive pulmonary disease, unspecified: Secondary | ICD-10-CM | POA: Diagnosis not present

## 2018-08-04 DIAGNOSIS — I69351 Hemiplegia and hemiparesis following cerebral infarction affecting right dominant side: Secondary | ICD-10-CM | POA: Diagnosis not present

## 2018-08-04 DIAGNOSIS — I10 Essential (primary) hypertension: Secondary | ICD-10-CM | POA: Diagnosis not present

## 2018-08-18 ENCOUNTER — Encounter: Payer: PPO | Attending: Registered Nurse | Admitting: Registered Nurse

## 2018-08-18 ENCOUNTER — Encounter: Payer: Self-pay | Admitting: Registered Nurse

## 2018-08-18 VITALS — BP 120/87 | HR 70 | Ht 63.0 in | Wt 183.6 lb

## 2018-08-18 DIAGNOSIS — E039 Hypothyroidism, unspecified: Secondary | ICD-10-CM | POA: Diagnosis not present

## 2018-08-18 DIAGNOSIS — I2510 Atherosclerotic heart disease of native coronary artery without angina pectoris: Secondary | ICD-10-CM | POA: Insufficient documentation

## 2018-08-18 DIAGNOSIS — I69320 Aphasia following cerebral infarction: Secondary | ICD-10-CM | POA: Diagnosis not present

## 2018-08-18 DIAGNOSIS — J449 Chronic obstructive pulmonary disease, unspecified: Secondary | ICD-10-CM | POA: Diagnosis not present

## 2018-08-18 DIAGNOSIS — I482 Chronic atrial fibrillation, unspecified: Secondary | ICD-10-CM | POA: Diagnosis not present

## 2018-08-18 DIAGNOSIS — I10 Essential (primary) hypertension: Secondary | ICD-10-CM | POA: Diagnosis not present

## 2018-08-18 DIAGNOSIS — Z87891 Personal history of nicotine dependence: Secondary | ICD-10-CM | POA: Diagnosis not present

## 2018-08-18 DIAGNOSIS — R410 Disorientation, unspecified: Secondary | ICD-10-CM | POA: Diagnosis not present

## 2018-08-18 DIAGNOSIS — Z8249 Family history of ischemic heart disease and other diseases of the circulatory system: Secondary | ICD-10-CM | POA: Insufficient documentation

## 2018-08-18 DIAGNOSIS — R262 Difficulty in walking, not elsewhere classified: Secondary | ICD-10-CM | POA: Diagnosis not present

## 2018-08-18 DIAGNOSIS — I110 Hypertensive heart disease with heart failure: Secondary | ICD-10-CM | POA: Diagnosis not present

## 2018-08-18 DIAGNOSIS — R7303 Prediabetes: Secondary | ICD-10-CM | POA: Diagnosis not present

## 2018-08-18 DIAGNOSIS — I5022 Chronic systolic (congestive) heart failure: Secondary | ICD-10-CM | POA: Diagnosis not present

## 2018-08-18 DIAGNOSIS — I63512 Cerebral infarction due to unspecified occlusion or stenosis of left middle cerebral artery: Secondary | ICD-10-CM | POA: Insufficient documentation

## 2018-08-18 NOTE — Progress Notes (Signed)
Subjective:    Patient ID: Kaitlyn Underwood, female    DOB: 1940-12-17, 77 y.o.   MRN: [id number]  HPI: Kaitlyn Underwood is a 77 y.o. female who is here for hospital  for follow up appointment.  She presented to [facility] on 07/06/2018 with right-sided weakness and aphasia.Ct and CTA  At outside hospital showed occlusion of substantial vessel in the left MCA distribution.  The patient did not receive TPA and was transferred to [facility] for further evaluation.  MRI showed small to moderate evolving acute left MCA territory infarction with an associated petechial hemorrhage without hemorrhagic transformation.  She underwent revascularization thrombectomy per interventional radiology.  She is currently maintaining on Eliquis for CVA prophylaxis.  She was admitted to rehab on 07/10/2018 and discharged on 12/7 2019.  Today she is here for hospital follow-up regarding her Left Middle Cerebral artery stroke, aphasia post stroke, CHF, Chronic Atrial Fibrillation and essential hypertension.  She is receiving outpatient therapy from [facility].  She denies any pain at this time also reports her appetite is improving.  Daughter is in the room all questions answered.   Pain Inventory Average Pain 0 Pain Right Now 0 My pain is na  In the last 24 hours, has pain interfered with the following? General activity 0 Relation with others 0 Enjoyment of life 0 What TIME of day is your pain at its worst? na Sleep (in general) Fair  Pain is worse with: na Pain improves with: na Relief from Meds: na  Mobility walk with assistance use a cane use a walker do you drive?  no  Function disabled: date disabled na I need assistance with the following:  dressing, bathing, meal prep, household duties and shopping  Neuro/Psych weakness trouble walking confusion  Prior Studies Any changes since last visit?  no  Physicians involved in your care Any changes since last visit?   no   Family History  Problem Relation Age of Onset  . Arrhythmia Sister   . Heart disease Brother   . Heart attack Maternal Uncle    Social History   Socioeconomic History  . Marital status: Married    Spouse name: Not on file  . Number of children: Not on file  . Years of education: Not on file  . Highest education level: Not on file  Occupational History  . Not on file  Social Needs  . Financial resource strain: Not on file  . Food insecurity:    Worry: Not on file    Inability: Not on file  . Transportation needs:    Medical: Not on file    Non-medical: Not on file  Tobacco Use  . Smoking status: Former Smoker    Packs/day: 1.00    Years: 20.00    Pack years: 20.00    Types: Cigarettes    Last attempt to quit: 06/19/1990    Years since quitting: 28.1  . Smokeless tobacco: Never Used  Substance and Sexual Activity  . Alcohol use: No    Alcohol/week: 0.0 standard drinks  . Drug use: No  . Sexual activity: Not on file  Lifestyle  . Physical activity:    Days per week: Not on file    Minutes per session: Not on file  . Stress: Not on file  Relationships  . Social connections:    Talks on phone: Not on file    Gets together: Not on file    Attends religious service: Not on  file    Active member of club or organization: Not on file    Attends meetings of clubs or organizations: Not on file    Relationship status: Not on file  Other Topics Concern  . Not on file  Social History Narrative  . Not on file   Past Surgical History:  Procedure Laterality Date  . IR CT HEAD LTD  07/06/2018  . IR PERCUTANEOUS ART THROMBECTOMY/INFUSION INTRACRANIAL INC DIAG ANGIO  07/06/2018  . LEFT HEART CATH AND CORONARY ANGIOGRAPHY N/A 04/29/2017   Procedure: LEFT HEART CATH AND CORONARY ANGIOGRAPHY;  Surgeon: Belva Crome, MD;  Location: [place] CV LAB;  Service: Cardiovascular;  Laterality: N/A;  . RADIOLOGY WITH ANESTHESIA N/A 07/05/2018   Procedure: RADIOLOGY WITH  ANESTHESIA;  Surgeon: Luanne Bras, MD;  Location: [place];  Service: Radiology;  Laterality: N/A;  . ULTRASOUND GUIDANCE FOR VASCULAR ACCESS  04/29/2017   Procedure: Ultrasound Guidance For Vascular Access;  Surgeon: Belva Crome, MD;  Location: [place] CV LAB;  Service: Cardiovascular;;   Past Medical History:  Diagnosis Date  . Anxiety   . Atrial fibrillation ([place])   . Chronic systolic heart failure ([place])   . COPD (chronic obstructive pulmonary disease) ([place])   . Coronary artery disease   . Hypertension   . Hypothyroidism   . Left renal mass    unconfirmed documentation of "kidney cancer", possibly cyst  . Pre-diabetes    There were no vitals taken for this visit.  Opioid Risk Score:   Fall Risk Score:  `1  Depression screen PHQ 2/9  No flowsheet data found.   Review of Systems  Constitutional: Negative.   HENT: Negative.   Eyes: Negative.   Respiratory: Negative.   Cardiovascular: Negative.   Gastrointestinal: Negative.   Endocrine: Negative.   Genitourinary: Negative.   Musculoskeletal: Positive for gait problem.  Skin: Negative.   Allergic/Immunologic: Negative.   Neurological: Positive for weakness.  Hematological: Negative.   Psychiatric/Behavioral: Positive for confusion.  All other systems reviewed and are negative.      Objective:   Physical Exam Vitals signs and nursing note reviewed.  Constitutional:      Appearance: Normal appearance.  Cardiovascular:     Rate and Rhythm: Rhythm irregular.  Pulmonary:     Effort: Pulmonary effort is normal.     Breath sounds: Normal breath sounds.  Musculoskeletal:     Comments: Normal Muscle Bulk and Muscle Testing Reveals: Upper Extremities: Full ROM and Muscle Strength on the Right 4/5 and Left  5/5 Lower Extremities:Full ROM and Muscle Strength 5/5 Arises from Table with ease  Narrow Based Gait   Skin:    General: Skin is warm and dry.  Neurological:     Mental Status: She is alert and  oriented to person, place, and time.     Comments: Word finding deficit noted.  Psychiatric:        Mood and Affect: Mood normal.        Behavior: Behavior normal.           Assessment & Plan:  1. Left Middle Cerebral Stroke/ Aphasia Post Stroke: Continue Outpatient Home Health Therapy from [place].  2. Chronic Systolic Heart Failure: Continue current medication regimen. PCP Following.  3. Chronic Atrial Fibrillation: Continue current medication regimen: PCP Following.  4. Essential Hypertension: Continue current medication regimen. PCP Following.   30 minutes of face to face patient care time was spent during this visit. All questions were encouraged and answered.  F/U in 4- 6 weeks with Dr. Letta Pate.

## 2018-08-21 ENCOUNTER — Other Ambulatory Visit (HOSPITAL_COMMUNITY): Payer: Self-pay | Admitting: Interventional Radiology

## 2018-08-21 DIAGNOSIS — I639 Cerebral infarction, unspecified: Secondary | ICD-10-CM

## 2018-08-24 NOTE — Progress Notes (Signed)
[facility] [address] [place]. [place] [address] [phone]       OFFICE FOLLOW UP NOTE  [name] Date of Birth:  Apr 23, 1941 Medical Record Number:  [id number]   Reason for Referral:  hospital stroke follow up  CHIEF COMPLAINT:  Chief Complaint  Patient presents with  . Follow-up    Hospital Stroke follow up room in back hallway pt with daughter Kaitlyn Underwood daughter  Pt has not seen cardioloigy this year    HPI: Kaitlyn Underwood is being seen today for initial visit in the office for left MCA territory infarct s/p thrombectomy of occluded inferior division superior duplicated left MCA with TICI 3 reperfusion secondary to A. fib not on AC on 07/05/2018. History obtained from patient, daughter and chart review. Reviewed all radiology images and labs personally.  Kaitlyn Underwood is a 78 year old female with history of AF not on AC, CAD, DM HTN and HLD who presented with right-sided weakness and aphasia.  TPA administered for continued deficits and then patient underwent thrombectomy for duplicated left MCA's left M2/M3 occlusion achieving TICI 3 reperfusion.  MRI brain reviewed showed left MCA small to moderate infarcts.  MRA unremarkable.  Carotid ultrasound showed left ICA 40 to 59% stenosis.  2D echo showed an EF of 25 to 30%.  LDL 72 and A1c 5.6.  Patient was on aspirin 325 mg PTA and recommended initiating Eliquis for secondary stroke prevention and atrial fibrillation management.  Patient was discharged to CIR for continued PT/OT/ST in stable condition.  Patient is being seen today for hospital follow-up and is accompanied by her daughter.  She continues to have mild expressive aphasia and cognitive deficits but otherwise has been recovering well regarding her right-sided weakness that is very minimal and is ambulating at baseline with a rolling walker.  Patient states that time she will will have a difficult time saying the correct word and worsens towards the end of the day,  after increased activity or with increased stress.  She currently participates in home therapies but daughter believes these will be ending soon due to insurance reasons and may have to start doing outpatient therapy.  She continues to live at home with her husband but has 2 daughters that check on her frequently and assist her as needed.  She continues to take Eliquis without side effects of bleeding or bruising.  She does have established cardiologist but has not made follow-up appointment since hospital discharge.  Continues on pravastatin without side effects myalgias.  Blood pressure today satisfactory at 135/71.  No further concerns at this time.  Denies new or worsening stroke/TIA symptoms.     ROS:   14 system review of systems performed and negative with exception of speech difficulty, walking difficulty and weakness  PMH:  Past Medical History:  Diagnosis Date  . Anxiety   . Atrial fibrillation ([place])   . Chronic systolic heart failure ([place])   . COPD (chronic obstructive pulmonary disease) ([place])   . Coronary artery disease   . Hypertension   . Hypothyroidism   . Left renal mass    unconfirmed documentation of "kidney cancer", possibly cyst  . Pre-diabetes   . Stroke [facility])     PSH:  Past Surgical History:  Procedure Laterality Date  . IR CT HEAD LTD  07/06/2018  . IR PERCUTANEOUS ART THROMBECTOMY/INFUSION INTRACRANIAL INC DIAG ANGIO  07/06/2018  . LEFT HEART CATH AND CORONARY ANGIOGRAPHY N/A 04/29/2017   Procedure: LEFT HEART CATH AND CORONARY  ANGIOGRAPHY;  Surgeon: Belva Crome, MD;  Location: [place] [facility];  Service: Cardiovascular;  Laterality: N/A;  . RADIOLOGY WITH ANESTHESIA N/A 07/05/2018   Procedure: RADIOLOGY WITH ANESTHESIA;  Surgeon: Luanne Bras, MD;  Location: [place];  Service: Radiology;  Laterality: N/A;  . ULTRASOUND GUIDANCE FOR VASCULAR ACCESS  04/29/2017   Procedure: Ultrasound Guidance For Vascular Access;  Surgeon: Belva Crome, MD;   Location: [place] [facility];  Service: Cardiovascular;;    Social History:  Social History   Socioeconomic History  . Marital status: Married    Spouse name: Not on file  . Number of children: Not on file  . Years of education: Not on file  . Highest education level: Not on file  Occupational History  . Not on file  Social Needs  . Financial resource strain: Not on file  . Food insecurity:    Worry: Not on file    Inability: Not on file  . Transportation needs:    Medical: Not on file    Non-medical: Not on file  Tobacco Use  . Smoking status: Former Smoker    Packs/day: 1.00    Years: 20.00    Pack years: 20.00    Types: Cigarettes    Last attempt to quit: 06/19/1990    Years since quitting: 28.2  . Smokeless tobacco: Never Used  Substance and Sexual Activity  . Alcohol use: No    Alcohol/week: 0.0 standard drinks  . Drug use: No  . Sexual activity: Not on file  Lifestyle  . Physical activity:    Days per week: Not on file    Minutes per session: Not on file  . Stress: Not on file  Relationships  . Social connections:    Talks on phone: Not on file    Gets together: Not on file    Attends religious service: Not on file    Active member of club or organization: Not on file    Attends meetings of clubs or organizations: Not on file    Relationship status: Not on file  . Intimate partner violence:    Fear of current or ex partner: Not on file    Emotionally abused: Not on file    Physically abused: Not on file    Forced sexual activity: Not on file  Other Topics Concern  . Not on file  Social History Narrative  . Not on file    Family History:  Family History  Problem Relation Age of Onset  . Arrhythmia Sister   . Heart disease Brother   . Heart attack Maternal Uncle     Medications:   Current Outpatient Medications on File Prior to Visit  Medication Sig Dispense Refill  . acetaminophen (TYLENOL) 325 MG tablet Take 2 tablets (650 mg total) by mouth  every 4 (four) hours as needed for mild pain (or temp > 37.5 C (99.5 F)).    Marland Kitchen albuterol (PROVENTIL HFA;VENTOLIN HFA) 108 (90 Base) MCG/ACT inhaler Inhale 2 puffs into the lungs every 6 (six) hours as needed for wheezing or shortness of breath. 1 Inhaler 1  . apixaban (ELIQUIS) 5 MG TABS tablet Take 1 tablet (5 mg total) by mouth 2 (two) times daily. 60 tablet 1  . Ascorbic Acid (VITAMIN C) 1000 MG tablet Take 1,000 mg by mouth 2 (two) times daily.    . budesonide (PULMICORT) 0.5 MG/2ML nebulizer solution Take 0.5 mg by nebulization daily.     . captopril (CAPOTEN) 50  MG tablet TAKE 1 TABLET BY MOUTH THREE TIMES DAILY (Patient taking differently: Take 50 mg by mouth 3 (three) times daily. ) 270 tablet 1  . digoxin (LANOXIN) 0.125 MG tablet Take 1 tablet (125 mcg total) by mouth daily. 90 tablet 1  . furosemide (LASIX) 40 MG tablet Take 40 mg by mouth daily.    Marland Kitchen ipratropium-albuterol (DUONEB) 0.5-2.5 (3) MG/3ML SOLN Take 3 mLs by nebulization every 6 (six) hours as needed (shortness of breath/wheezing.).     Marland Kitchen metoprolol tartrate (LOPRESSOR) 25 MG tablet Take 0.5 tablets (12.5 mg total) by mouth 2 (two) times daily. 60 tablet 1  . montelukast (SINGULAIR) 10 MG tablet Take 1 tablet (10 mg total) by mouth daily. 30 tablet 1  . nitroGLYCERIN (NITROSTAT) 0.4 MG SL tablet Place 0.4 mg under the tongue every 5 (five) minutes as needed for chest pain.     . pantoprazole sodium (PROTONIX) 40 mg/20 mL PACK Take 20 mLs (40 mg total) by mouth daily at 6 PM. 30 each 1  . pravastatin (PRAVACHOL) 20 MG tablet Take 1 tablet (20 mg total) by mouth daily at 6 PM. 30 tablet 1  . sertraline (ZOLOFT) 25 MG tablet Take 1 tablet (25 mg total) by mouth daily. 30 tablet 1  . vitamin E 400 UNIT capsule Take 400 Units by mouth daily.     No current facility-administered medications on file prior to visit.     Allergies:   Allergies  Allergen Reactions  . Sulfa Antibiotics Anaphylaxis    tongue     Physical  Exam  Vitals:   08/25/18 1235  BP: 135/71  Underwood: (!) 60  Weight: 186 lb (84.4 kg)  Height: 5\' 4"  (1.626 m)   Body mass index is 31.93 kg/m. No exam data present  General: well developed, well nourished, seated, in no evident distress Head: head normocephalic and atraumatic.   Neck: supple with no carotid or supraclavicular bruits Cardiovascular: regular rate and rhythm, no murmurs Musculoskeletal: no deformity Skin:  no rash/petichiae Vascular:  Normal pulses all extremities  Neurologic Exam Mental Status: Awake and fully alert. Mild Expressive aphasia. Oriented to place and time. Recent and remote memory intact. Attention span, concentration and fund of knowledge appropriate during appointment but daughter states that time she can be confused. Mood and affect appropriate.  Cranial Nerves: Fundoscopic exam reveals sharp disc margins. Pupils equal, briskly reactive to light. Extraocular movements full without nystagmus. Visual fields full to confrontation. Hearing intact. Facial sensation intact. Face, tongue, palate moves normally and symmetrically.  Motor: Normal bulk and tone. Normal strength in all tested extremity muscles except for minimal right upper and lower extremity weakness.  Essential tremors in bilateral upper extremities which were present prior to her stroke. Sensory.: intact to touch , pinprick , position and vibratory sensation.  Coordination: Rapid alternating movements normal in all extremities. Finger-to-nose and heel-to-shin performed accurately bilaterally. Gait and Station: Arises from chair without difficulty. Stance is hunched.  Gait demonstrates normal stride length and balance with assistance of rolling walker. Unble to heel, toe and tandem walk. Reflexes: 1+ and symmetric. Toes downgoing.    NIHSS  2 Modified Rankin  2 CHA2DS2-VASc 6 HAS-BLED 2   Diagnostic Data (Labs, Imaging, Testing)  Mr Kaitlyn Underwood Head Wo Contrast 07/06/2018 IMPRESSION:   MRI  HEAD  1. Small to moderate evolving acute left MCA territory infarct as above. Associated petechial hemorrhage without frank hemorrhagic transformation or significant mass effect.  2. Underlying moderate chronic microvascular ischemic  disease.   MRA HEAD  1. Negative intracranial MRA. No evidence for large vessel occlusion status post catheter directed thrombectomy. No hemodynamically significant or correctable stenosis identified.  2. Duplicated left M1 segment.    [place] 07/07/2018 IMPRESSION:  Status post endovascular complete revascularization of occluded inferior division of the superior duplicated left middle cerebral artery with 2 passes with the 3 mm x 20 mm Trevo ProVue retrieval device, and 1 pass with the Embotrap 5 mm x 33 mm retrieval device achieving a revascularization.    Dg Chest Port 1 View 07/07/2018 IMPRESSION:  1. Moderate enlargement of the cardiopericardial silhouette, without edema.  2. Degenerative glenohumeral arthropathy bilaterally.    Portable Chest X-ray 07/06/2018 IMPRESSION:  1. Endotracheal tube tip at the level of the clavicular heads.  2. Cardiomegaly.    Dg Abd Portable 1v 07/06/2018 IMPRESSION:  OG tube tip in the expected location of the gastroduodenal junction.    Carotid Dopplers (at [place] Only) 07/07/2018 Summary:  Right Carotid: Velocities in the right ICA are consistent with a 1-39% stenosis.  Left Carotid: Velocities in the left ICA are consistent with a 40-59% stenosis.  Vertebrals: Bilateral vertebral arteries demonstrate antegrade flow.    CXR 07/08/18 IMPRESSION: Stable cardiomegaly. No acute abnormality seen.   Transthoracic Echocardiogram 07/07/2018 Study Conclusions - Left ventricle: The cavity size was moderately dilated. There was mild focal basal hypertrophy of the septum. Systolic function was severely reduced. The estimated ejection fraction was in the range of 25% to 30%.  Diffuse hypokinesis and dyskinesis of the basal and mid inferoseptal, anteroseptal and apical septal walls. The study was not technically sufficient to allow evaluation of LV diastolic dysfunction due to atrial fibrillation. - Mitral valve: There was severe regurgitation directed posteriorly. - Left atrium: The atrium was moderately dilated. - Right ventricle: The cavity size was moderately dilated. Wall thickness was normal. Systolic function was mildly reduced. - Right atrium: The atrium was moderately dilated. - Pulmonary arteries: Systolic pressure was moderately increased. PA peak pressure: 57 mm Hg (S). - Inferior vena cava: The vessel was normal in size. - Pericardium, extracardiac: There was no pericardial effusion. Impressions: - Severe LV systolic dysfunction, moderate dilatation. Severe mitral regurgitation that is functional. Mild systolic RV dysfunction. Moderate pulmonary hypertension.    ASSESSMENT: Kaitlyn Underwood is a 78 y.o. year old female here with left MCA infarct left M2 occlusion status post TPA and IR with TICI 3 reperfusion embolic pattern secondary to AF not on AC on 07/05/2018. Vascular risk factors include AF not on AC, CHF, CAD, HTN, HLD and DM.  Patient is being seen today for hospital follow-up and overall has been recovering well with only minimal right hemiparesis, mild expressive aphasia and intermittent cognitive concerns.    PLAN:  1. Left MCA embolic infarcts: Continue Eliquis (apixaban) daily  and pravastatin for secondary stroke prevention. Maintain strict control of hypertension with blood pressure goal below 130/90, diabetes with hemoglobin A1c goal below 6.5% and cholesterol with LDL cholesterol (bad cholesterol) goal below 70 mg/dL.  I also advised the patient to eat a healthy diet with plenty of whole grains, cereals, fruits and vegetables, exercise regularly with at least 30 minutes of continuous activity daily and maintain ideal  body weight.  Recommended continued speech therapy and physical therapy for continued deficits.  Advised daughter that if additional orders are needed for continuation of therapies, to notify our office 2. HTN: Advised to continue current treatment regimen.  Today's  BP 135/71.  Advised to continue to monitor at home along with continued follow-up with PCP for management 3. HLD: Advised to continue current treatment regimen along with continued follow-up with PCP for future prescribing and monitoring of lipid panel 4. DMII: Advised to continue to monitor glucose levels at home along with continued follow-up with PCP for management and monitoring 5. F/u with vascular surgery Dr. Estanislado Pandy 08/31/2018 for surveillance monitoring    Follow up in 3 months or call earlier if needed   Greater than 50% of time during this 25 minute visit was spent on counseling, explanation of diagnosis of left MCA embolic infarcts, reviewing risk factor management of atrial fibrillation, HTN, HLD and DM, planning of further management along with potential future management, and discussion with patient and family answering all questions.    Venancio Poisson, AGNP-BC  [facility] [address] [place], [place] [address]  Phone [phone] Fax [phone] Note: This document was prepared with digital dictation and possible smart phrase technology. Any transcriptional errors that result from this process are unintentional.

## 2018-08-25 ENCOUNTER — Ambulatory Visit (INDEPENDENT_AMBULATORY_CARE_PROVIDER_SITE_OTHER): Payer: PPO | Admitting: Adult Health

## 2018-08-25 ENCOUNTER — Encounter: Payer: Self-pay | Admitting: Adult Health

## 2018-08-25 VITALS — BP 135/71 | HR 59 | Ht 64.0 in | Wt 186.0 lb

## 2018-08-25 DIAGNOSIS — I63412 Cerebral infarction due to embolism of left middle cerebral artery: Secondary | ICD-10-CM | POA: Diagnosis not present

## 2018-08-25 DIAGNOSIS — I10 Essential (primary) hypertension: Secondary | ICD-10-CM | POA: Diagnosis not present

## 2018-08-25 DIAGNOSIS — I482 Chronic atrial fibrillation, unspecified: Secondary | ICD-10-CM

## 2018-08-25 DIAGNOSIS — E785 Hyperlipidemia, unspecified: Secondary | ICD-10-CM | POA: Diagnosis not present

## 2018-08-25 NOTE — Patient Instructions (Signed)
Continue Eliquis (apixaban) daily  and pravastatin  for secondary stroke prevention  Continue to follow up with PCP regarding cholesterol and blood pressure management   Please schedule appointment with cardiologist   Continue to do speech therapy - if you need additional orders, please let me know Ensure you do home exercises for continued speech difficulties  Continue to stay active and maintain a healthy diet  Continue to monitor blood pressure at home  Maintain strict control of hypertension with blood pressure goal below 130/90, diabetes with hemoglobin A1c goal below 6.5% and cholesterol with LDL cholesterol (bad cholesterol) goal below 70 mg/dL. I also advised the patient to eat a healthy diet with plenty of whole grains, cereals, fruits and vegetables, exercise regularly and maintain ideal body weight.  Followup in the future with me in 3 months or call earlier if needed       Thank you for coming to see [place] at [facility]. I hope we have been able to provide you high quality care today.  You may receive a patient satisfaction survey over the next few weeks. We would appreciate your feedback and comments so that we may continue to improve ourselves and the health of our patients.

## 2018-08-27 DIAGNOSIS — I482 Chronic atrial fibrillation, unspecified: Secondary | ICD-10-CM | POA: Diagnosis not present

## 2018-08-27 DIAGNOSIS — Z6831 Body mass index (BMI) 31.0-31.9, adult: Secondary | ICD-10-CM | POA: Diagnosis not present

## 2018-08-27 DIAGNOSIS — R001 Bradycardia, unspecified: Secondary | ICD-10-CM | POA: Diagnosis not present

## 2018-08-27 DIAGNOSIS — I10 Essential (primary) hypertension: Secondary | ICD-10-CM | POA: Diagnosis not present

## 2018-08-31 ENCOUNTER — Ambulatory Visit (HOSPITAL_COMMUNITY)
Admission: RE | Admit: 2018-08-31 | Discharge: 2018-08-31 | Disposition: A | Discharge status: Home / Self Care | Payer: PPO | Source: Ambulatory Visit | Attending: Interventional Radiology | Admitting: Interventional Radiology

## 2018-08-31 DIAGNOSIS — I639 Cerebral infarction, unspecified: Secondary | ICD-10-CM

## 2018-08-31 NOTE — Progress Notes (Signed)
Chief Complaint: Patient was seen in consultation today for follow up (L)CVA with revascularization   Supervising Physician: Luanne Bras  Patient Status: [facility] - Out-pt  History of Present Illness: Kaitlyn Underwood is a 78 y.o. female who suffered acute (L)MCA territory CVA on 11/19 secondary to AF. She was taken to [facility] and underwent successful revascularization procedure by Dr. Estanislado Pandy. She recovered pretty well, spending some time in acute rehab. She was discharged home and continues to do some outpt therapy. She and her husband both report that she is making great progress from her hospitalization. She remains on Eliquis for her A Fib and stroke prevention. She is still having difficulty with speech and a little residual (R)UE weakness and fine motor issues. Was seen by Neurology last week. PMHx, meds, labs, imaging, allergies reviewed. Feels well, no recent fevers, chills, illness. Husband with her today.    Past Medical History:  Diagnosis Date  . Anxiety   . Atrial fibrillation ([place])   . Chronic systolic heart failure ([place])   . COPD (chronic obstructive pulmonary disease) ([place])   . Coronary artery disease   . Hypertension   . Hypothyroidism   . Left renal mass    unconfirmed documentation of "kidney cancer", possibly cyst  . Pre-diabetes   . Stroke [facility])     Past Surgical History:  Procedure Laterality Date  . IR CT HEAD LTD  07/06/2018  . IR PERCUTANEOUS ART THROMBECTOMY/INFUSION INTRACRANIAL INC DIAG ANGIO  07/06/2018  . LEFT HEART CATH AND CORONARY ANGIOGRAPHY N/A 04/29/2017   Procedure: LEFT HEART CATH AND CORONARY ANGIOGRAPHY;  Surgeon: Belva Crome, MD;  Location: [place] [facility];  Service: Cardiovascular;  Laterality: N/A;  . RADIOLOGY WITH ANESTHESIA N/A 07/05/2018   Procedure: RADIOLOGY WITH ANESTHESIA;  Surgeon: Luanne Bras, MD;  Location: [place];  Service: Radiology;  Laterality: N/A;  . ULTRASOUND GUIDANCE FOR VASCULAR ACCESS  04/29/2017     Procedure: Ultrasound Guidance For Vascular Access;  Surgeon: Belva Crome, MD;  Location: [place] [facility];  Service: Cardiovascular;;    Allergies: Sulfa antibiotics  Medications: Prior to Admission medications   Medication Sig Start Date End Date Taking? Authorizing Provider  acetaminophen (TYLENOL) 325 MG tablet Take 2 tablets (650 mg total) by mouth every 4 (four) hours as needed for mild pain (or temp > 37.5 C (99.5 F)). 07/24/18   Angiulli, Lavon Paganini, PA-C  albuterol (PROVENTIL HFA;VENTOLIN HFA) 108 (90 Base) MCG/ACT inhaler Inhale 2 puffs into the lungs every 6 (six) hours as needed for wheezing or shortness of breath. 07/24/18   Angiulli, Lavon Paganini, PA-C  apixaban (ELIQUIS) 5 MG TABS tablet Take 1 tablet (5 mg total) by mouth 2 (two) times daily. 07/24/18   Angiulli, Lavon Paganini, PA-C  Ascorbic Acid (VITAMIN C) 1000 MG tablet Take 1,000 mg by mouth 2 (two) times daily.    [provider]  budesonide (PULMICORT) 0.5 MG/2ML nebulizer solution Take 0.5 mg by nebulization daily.     [provider]  captopril (CAPOTEN) 50 MG tablet TAKE 1 TABLET BY MOUTH THREE TIMES DAILY Patient taking differently: Take 50 mg by mouth 3 (three) times daily.  06/23/18   Revankar, Reita Cliche, MD  digoxin (LANOXIN) 0.125 MG tablet Take 1 tablet (125 mcg total) by mouth daily. 07/24/18   Angiulli, Lavon Paganini, PA-C  furosemide (LASIX) 40 MG tablet Take 40 mg by mouth daily.    [provider]  ipratropium-albuterol (DUONEB) 0.5-2.5 (3) MG/3ML SOLN Take  3 mLs by nebulization every 6 (six) hours as needed (shortness of breath/wheezing.).  05/13/17   [provider]  metoprolol tartrate (LOPRESSOR) 25 MG tablet Take 0.5 tablets (12.5 mg total) by mouth 2 (two) times daily. 07/24/18   Angiulli, Lavon Paganini, PA-C  montelukast (SINGULAIR) 10 MG tablet Take 1 tablet (10 mg total) by mouth daily. 07/24/18   Angiulli, Lavon Paganini, PA-C  nitroGLYCERIN (NITROSTAT) 0.4 MG SL tablet Place 0.4 mg under the  tongue every 5 (five) minutes as needed for chest pain.  11/16/15   [provider]  pantoprazole sodium (PROTONIX) 40 mg/20 mL PACK Take 20 mLs (40 mg total) by mouth daily at 6 PM. 07/24/18   Angiulli, Lavon Paganini, PA-C  pravastatin (PRAVACHOL) 20 MG tablet Take 1 tablet (20 mg total) by mouth daily at 6 PM. 07/24/18   Angiulli, Lavon Paganini, PA-C  sertraline (ZOLOFT) 25 MG tablet Take 1 tablet (25 mg total) by mouth daily. 07/24/18   Angiulli, Lavon Paganini, PA-C  vitamin E 400 UNIT capsule Take 400 Units by mouth daily.    [provider]     Family History  Problem Relation Age of Onset  . Arrhythmia Sister   . Heart disease Brother   . Heart attack Maternal Uncle     Social History   Socioeconomic History  . Marital status: Married    Spouse name: Not on file  . Number of children: Not on file  . Years of education: Not on file  . Highest education level: Not on file  Occupational History  . Not on file  Social Needs  . Financial resource strain: Not on file  . Food insecurity:    Worry: Not on file    Inability: Not on file  . Transportation needs:    Medical: Not on file    Non-medical: Not on file  Tobacco Use  . Smoking status: Former Smoker    Packs/day: 1.00    Years: 20.00    Pack years: 20.00    Types: Cigarettes    Last attempt to quit: 06/19/1990    Years since quitting: 28.2  . Smokeless tobacco: Never Used  Substance and Sexual Activity  . Alcohol use: No    Alcohol/week: 0.0 standard drinks  . Drug use: No  . Sexual activity: Not on file  Lifestyle  . Physical activity:    Days per week: Not on file    Minutes per session: Not on file  . Stress: Not on file  Relationships  . Social connections:    Talks on phone: Not on file    Gets together: Not on file    Attends religious service: Not on file    Active member of club or organization: Not on file    Attends meetings of clubs or organizations: Not on file    Relationship status: Not on  file  Other Topics Concern  . Not on file  Social History Narrative  . Not on file     Review of Systems: A 12 point ROS discussed and pertinent positives are indicated in the HPI above.  All other systems are negative.  Review of Systems  Vital Signs: There were no vitals taken for this visit.  Physical Exam A&O x 3 Mild expressive aphasia. PERRL, EOMI Face and tongue symmetric/midline Mild right UE grip weakness, fine motor intact   Imaging: No results found.  Labs:  CBC: Recent Labs    07/09/18 0524 07/10/18 0403 07/13/18 6967  07/20/18 0856  WBC 8.8 8.6 10.2 8.4  HGB 10.4* 10.3* 11.6* 12.5  HCT 33.6* 33.6* 36.3 39.0  PLT 320 377 412* 466*    COAGS: No results for input(s): INR, APTT in the last 8760 hours.  BMP: Recent Labs    07/10/18 0403 07/13/18 0807 07/17/18 0624 07/20/18 0856  NA 143 144 139 141  K 4.0 3.8 4.7 4.1  CL 113* 112* 108 104  CO2 23 26 22 29   GLUCOSE 88 105* 87 133*  BUN 15 12 12 10   CALCIUM 9.6 9.4 9.1 9.6  CREATININE 0.88 0.93 0.79 0.92  GFRNONAA >60 58* >60 >60  GFRAA >60 >60 >60 >60    LIVER FUNCTION TESTS: Recent Labs    05/12/18 1212 07/13/18 0807  BILITOT 0.7 0.9  AST 25 24  ALT 19 20  ALKPHOS 119* 74  PROT 6.6 5.9*  ALBUMIN 4.3 3.3*    TUMOR MARKERS: No results for input(s): AFPTM, CEA, CA199, CHROMGRNA in the last 8760 hours.  Assessment and Plan: Recent [facility] CVA S/p complete revasc 11/18 Doing well. Continue Eliquis as directed. Medical management of co-morbidities No specific imaging at this time.    Electronically Signed: Ascencion Dike, PA-C 08/31/2018, 2:47 PM   I spent a total of 20 minutes in face to face in clinical consultation, greater than 50% of which was counseling/coordinating care for stroke intervention

## 2018-09-02 ENCOUNTER — Encounter: Payer: Self-pay | Admitting: Cardiology

## 2018-09-02 ENCOUNTER — Ambulatory Visit (INDEPENDENT_AMBULATORY_CARE_PROVIDER_SITE_OTHER): Payer: PPO | Admitting: Cardiology

## 2018-09-02 VITALS — BP 130/72 | HR 71 | Ht 64.0 in | Wt 182.0 lb

## 2018-09-02 DIAGNOSIS — I10 Essential (primary) hypertension: Secondary | ICD-10-CM

## 2018-09-02 DIAGNOSIS — I482 Chronic atrial fibrillation, unspecified: Secondary | ICD-10-CM | POA: Diagnosis not present

## 2018-09-02 DIAGNOSIS — I2510 Atherosclerotic heart disease of native coronary artery without angina pectoris: Secondary | ICD-10-CM

## 2018-09-02 DIAGNOSIS — I63512 Cerebral infarction due to unspecified occlusion or stenosis of left middle cerebral artery: Secondary | ICD-10-CM | POA: Diagnosis not present

## 2018-09-02 DIAGNOSIS — E088 Diabetes mellitus due to underlying condition with unspecified complications: Secondary | ICD-10-CM

## 2018-09-02 DIAGNOSIS — E785 Hyperlipidemia, unspecified: Secondary | ICD-10-CM | POA: Diagnosis not present

## 2018-09-02 NOTE — Patient Instructions (Signed)

## 2018-09-02 NOTE — Progress Notes (Signed)
Cardiology Office Note:    Date:  09/02/2018   ID:  Kaitlyn Underwood, DOB Dec 23, 1940, MRN [id number]  PCP:  Nicoletta Dress, MD  Cardiologist:  Jenean Lindau, MD   Referring MD: Nicoletta Dress, MD    ASSESSMENT:    1. Chronic atrial fibrillation   2. Coronary artery disease involving native coronary artery of native heart without angina pectoris   3. Essential hypertension   4. Acute ischemic left MCA stroke ([place])   5. Dyslipidemia   6. Diabetes mellitus due to underlying condition with unspecified complications ([place])   7. Obesities, morbid ([place])    PLAN:    In order of problems listed above:  1. Secondary prevention stressed.  Importance of compliance with diet and stressed.  Her hospital records indicate cardiomyopathy and issues including diet and regular weights and she vocalized understanding. 2. She is now on anticoagulation and fall precautions were advised extensively and they vocalized understanding. 3. Her heart rate and blood pressure are stable.Patient will be seen in follow-up appointment in 4 months or earlier if the patient has any concerns 4.    Medication Adjustments/Labs and Tests Ordered: Current medicines are reviewed at length with the patient today.  Concerns regarding medicines are outlined above.  No orders of the defined types were placed in this encounter.  No orders of the defined types were placed in this encounter.    No chief complaint on file.    History of Present Illness:    Kaitlyn Underwood is a 78 y.o. female.  Patient mentions to me that she was admitted and treated for stroke.  This was at [facility].  Subsequently she has done fine.  Her family is very supportive.  She has atrial fibrillation.  She was not on anticoagulation gait instability.  At the time of my evaluation, the patient is alert awake oriented and in no distress.  Past Medical History:  Diagnosis Date  . Anxiety   . Atrial fibrillation ([place])   . Chronic  systolic heart failure ([place])   . COPD (chronic obstructive pulmonary disease) ([place])   . Coronary artery disease   . Hypertension   . Hypothyroidism   . Left renal mass    unconfirmed documentation of "kidney cancer", possibly cyst  . Pre-diabetes   . Stroke [facility])     Past Surgical History:  Procedure Laterality Date  . IR CT HEAD LTD  07/06/2018  . IR PERCUTANEOUS ART THROMBECTOMY/INFUSION INTRACRANIAL INC DIAG ANGIO  07/06/2018  . LEFT HEART CATH AND CORONARY ANGIOGRAPHY N/A 04/29/2017   Procedure: LEFT HEART CATH AND CORONARY ANGIOGRAPHY;  Surgeon: Belva Crome, MD;  Location: [place] CV [facility];  Service: Cardiovascular;  Laterality: N/A;  . RADIOLOGY WITH ANESTHESIA N/A 07/05/2018   Procedure: RADIOLOGY WITH ANESTHESIA;  Surgeon: Luanne Bras, MD;  Location: [place];  Service: Radiology;  Laterality: N/A;  . ULTRASOUND GUIDANCE FOR VASCULAR ACCESS  04/29/2017   Procedure: Ultrasound Guidance For Vascular Access;  Surgeon: Belva Crome, MD;  Location: [place] [facility];  Service: Cardiovascular;;    Current Medications: Current Meds  Medication Sig  . acetaminophen (TYLENOL) 325 MG tablet Take 2 tablets (650 mg total) by mouth every 4 (four) hours as needed for mild pain (or temp > 37.5 C (99.5 F)).  Marland Kitchen albuterol (PROVENTIL HFA;VENTOLIN HFA) 108 (90 Base) MCG/ACT inhaler Inhale 2 puffs into the lungs every 6 (six) hours as needed for wheezing or shortness of breath.  Marland Kitchen  apixaban (ELIQUIS) 5 MG TABS tablet Take 1 tablet (5 mg total) by mouth 2 (two) times daily.  . budesonide (PULMICORT) 0.5 MG/2ML nebulizer solution Take 0.5 mg by nebulization daily.   . captopril (CAPOTEN) 50 MG tablet TAKE 1 TABLET BY MOUTH THREE TIMES DAILY (Patient taking differently: Take 50 mg by mouth 3 (three) times daily. )  . digoxin (LANOXIN) 0.125 MG tablet Take 1 tablet (125 mcg total) by mouth daily.  . furosemide (LASIX) 40 MG tablet Take 40 mg by mouth daily.  Marland Kitchen ipratropium-albuterol (DUONEB)  0.5-2.5 (3) MG/3ML SOLN Take 3 mLs by nebulization every 6 (six) hours as needed (shortness of breath/wheezing.).   Marland Kitchen metoprolol tartrate (LOPRESSOR) 25 MG tablet Take 0.5 tablets (12.5 mg total) by mouth 2 (two) times daily.  . montelukast (SINGULAIR) 10 MG tablet Take 1 tablet (10 mg total) by mouth daily.  . nitroGLYCERIN (NITROSTAT) 0.4 MG SL tablet Place 0.4 mg under the tongue every 5 (five) minutes as needed for chest pain.   . pantoprazole sodium (PROTONIX) 40 mg/20 mL PACK Take 20 mLs (40 mg total) by mouth daily at 6 PM.  . pravastatin (PRAVACHOL) 20 MG tablet Take 1 tablet (20 mg total) by mouth daily at 6 PM.  . sertraline (ZOLOFT) 25 MG tablet Take 1 tablet (25 mg total) by mouth daily.     Allergies:   Sulfa antibiotics   Social History   Socioeconomic History  . Marital status: Married    Spouse name: Not on file  . Number of children: Not on file  . Years of education: Not on file  . Highest education level: Not on file  Occupational History  . Not on file  Social Needs  . Financial resource strain: Not on file  . Food insecurity:    Worry: Not on file    Inability: Not on file  . Transportation needs:    Medical: Not on file    Non-medical: Not on file  Tobacco Use  . Smoking status: Former Smoker    Packs/day: 1.00    Years: 20.00    Pack years: 20.00    Types: Cigarettes    Last attempt to quit: 06/19/1990    Years since quitting: 28.2  . Smokeless tobacco: Never Used  Substance and Sexual Activity  . Alcohol use: No    Alcohol/week: 0.0 standard drinks  . Drug use: No  . Sexual activity: Not on file  Lifestyle  . Physical activity:    Days per week: Not on file    Minutes per session: Not on file  . Stress: Not on file  Relationships  . Social connections:    Talks on phone: Not on file    Gets together: Not on file    Attends religious service: Not on file    Active member of club or organization: Not on file    Attends meetings of clubs or  organizations: Not on file    Relationship status: Not on file  Other Topics Concern  . Not on file  Social History Narrative  . Not on file     Family History: The patient's family history includes Arrhythmia in her sister; Heart attack in her maternal uncle; Heart disease in her brother.  ROS:   Please see the history of present illness.    All other systems reviewed and are negative.  EKGs/Labs/Other Studies Reviewed:    The following studies were reviewed today: I discussed my findings with the patient at  extensive length.  Next   Recent Labs: 05/12/2018: TSH 4.470 07/06/2018: Magnesium 1.8 07/13/2018: ALT 20 07/20/2018: BUN 10; Creatinine, Ser 0.92; Hemoglobin 12.5; Platelets 466; Potassium 4.1; Sodium 141  Recent Lipid Panel    Component Value Date/Time   CHOL 146 07/06/2018 0704   CHOL 189 05/12/2018 1212   TRIG 124 07/06/2018 0704   HDL 49 07/06/2018 0704   HDL 63 05/12/2018 1212   CHOLHDL 3.0 07/06/2018 0704   VLDL 25 07/06/2018 0704   LDLCALC 72 07/06/2018 0704   LDLCALC 103 (H) 05/12/2018 1212    Physical Exam:    VS:  BP 130/72 (BP Location: Right Arm, Patient Position: Sitting, Cuff Size: Normal)   Pulse 71   Ht 5\' 4"  (1.626 m)   Wt 182 lb (82.6 kg)   SpO2 97%   BMI 31.24 kg/m     Wt Readings from Last 3 Encounters:  09/02/18 182 lb (82.6 kg)  08/25/18 186 lb (84.4 kg)  08/18/18 183 lb 9.6 oz (83.3 kg)     GEN: Patient is in no acute distress HEENT: Normal NECK: No JVD; No carotid bruits LYMPHATICS: No lymphadenopathy CARDIAC: Hear sounds regular, 2/6 systolic murmur at the apex. RESPIRATORY:  Clear to auscultation without rales, wheezing or rhonchi  ABDOMEN: Soft, non-tender, non-distended MUSCULOSKELETAL:  No edema; No deformity  SKIN: Warm and dry NEUROLOGIC:  Alert and oriented x 3 PSYCHIATRIC:  Normal affect   Signed, Jenean Lindau, MD  09/02/2018 11:45 AM    [place]

## 2018-09-04 NOTE — Progress Notes (Signed)
I agree with the above plan 

## 2018-09-15 ENCOUNTER — Ambulatory Visit: Payer: PPO | Admitting: Physical Medicine & Rehabilitation

## 2018-09-22 ENCOUNTER — Encounter: Payer: Self-pay | Admitting: Physical Medicine & Rehabilitation

## 2018-09-22 ENCOUNTER — Encounter: Payer: PPO | Attending: Registered Nurse

## 2018-09-22 ENCOUNTER — Ambulatory Visit: Payer: PPO | Admitting: Physical Medicine & Rehabilitation

## 2018-09-22 VITALS — BP 123/64 | HR 55 | Ht 64.0 in | Wt 180.0 lb

## 2018-09-22 DIAGNOSIS — J449 Chronic obstructive pulmonary disease, unspecified: Secondary | ICD-10-CM | POA: Insufficient documentation

## 2018-09-22 DIAGNOSIS — I69320 Aphasia following cerebral infarction: Secondary | ICD-10-CM | POA: Insufficient documentation

## 2018-09-22 DIAGNOSIS — I5022 Chronic systolic (congestive) heart failure: Secondary | ICD-10-CM | POA: Insufficient documentation

## 2018-09-22 DIAGNOSIS — I63512 Cerebral infarction due to unspecified occlusion or stenosis of left middle cerebral artery: Secondary | ICD-10-CM

## 2018-09-22 DIAGNOSIS — R7303 Prediabetes: Secondary | ICD-10-CM | POA: Insufficient documentation

## 2018-09-22 DIAGNOSIS — I69390 Apraxia following cerebral infarction: Secondary | ICD-10-CM | POA: Diagnosis not present

## 2018-09-22 DIAGNOSIS — I482 Chronic atrial fibrillation, unspecified: Secondary | ICD-10-CM | POA: Diagnosis not present

## 2018-09-22 DIAGNOSIS — Z8249 Family history of ischemic heart disease and other diseases of the circulatory system: Secondary | ICD-10-CM | POA: Diagnosis not present

## 2018-09-22 DIAGNOSIS — I110 Hypertensive heart disease with heart failure: Secondary | ICD-10-CM | POA: Insufficient documentation

## 2018-09-22 DIAGNOSIS — I2510 Atherosclerotic heart disease of native coronary artery without angina pectoris: Secondary | ICD-10-CM | POA: Insufficient documentation

## 2018-09-22 DIAGNOSIS — R410 Disorientation, unspecified: Secondary | ICD-10-CM | POA: Diagnosis not present

## 2018-09-22 DIAGNOSIS — E039 Hypothyroidism, unspecified: Secondary | ICD-10-CM | POA: Diagnosis not present

## 2018-09-22 DIAGNOSIS — Z87891 Personal history of nicotine dependence: Secondary | ICD-10-CM | POA: Diagnosis not present

## 2018-09-22 DIAGNOSIS — R262 Difficulty in walking, not elsewhere classified: Secondary | ICD-10-CM | POA: Insufficient documentation

## 2018-09-22 NOTE — Progress Notes (Signed)
Subjective:    Patient ID: Kaitlyn Underwood, female    DOB: [birth year], 78 y.o.   MRN: [id number] 78 year old right-handed female with history of hypertension, prediabetes, atrial fibrillation, maintained on Lanoxin chronic systolic congestive heart failure, COPD, CAD maintained on aspirin.  Lives with spouse, independent prior to  admission.  Presented to [facility] on 07/06/2018 with right-sided weakness and aphasia.  CT and CTA outside hospital showed occlusion of substantial vessel in the left MCA distribution.  The patient did not receive tPA and was transferred to  [facility] for further evaluation.  MRI of the brain showed small to moderate evolving acute left MCA territory infarction with associated petechial hemorrhage without frank hemorrhagic transformation.  Negative MRA.  Underwent revascularization  thrombectomy per interventional radiology.  EEG negative for seizure.  Echocardiogram with ejection fraction of 30%, diffuse hypokinesis.  Maintained on a dysphagia #1 pudding thick liquid diet.  Currently, maintained on Eliquis for CVA prophylaxis.   The patient was admitted for comprehensive rehabilitation program.   ADMISSION DATE:  07/10/2018   DISCHARGE DATE:  07/25/2018  Mobility upon discharge Ambulates 100 feet x2 rolling walker, contact guard assist.  HPI Used to get PT and OT now only getting SLP through [place] cough with meals Appetite ok  [place] at home 1 wk ago no injury , needed help from family to get up Pt had incontinent episode today in office.  Per daughter this occurs mainly with travel   Pain Inventory Average Pain 4 Pain Right Now 0 My pain is intermittent and sharp  In the last 24 hours, has pain interfered with the following? General activity 3 Relation with others 0 Enjoyment of life 0 What TIME of day is your pain at its worst? morning Sleep (in general) Fair  Pain is worse with: standing Pain improves with: rest and  medication Relief from Meds: 5  Mobility walk with assistance use a walker ability to climb steps?  yes do you drive?  no  Function retired  Neuro/Psych bladder control problems weakness tremor trouble walking anxiety  Prior Studies Any changes since last visit?  no  Physicians involved in your care Any changes since last visit?  no   Family History  Problem Relation Age of Onset  . Arrhythmia Sister   . Heart disease Brother   . Heart attack Maternal Uncle    Social History   Socioeconomic History  . Marital status: Married    Spouse name: Not on file  . Number of children: Not on file  . Years of education: Not on file  . Highest education level: Not on file  Occupational History  . Not on file  Social Needs  . Financial resource strain: Not on file  . Food insecurity:    Worry: Not on file    Inability: Not on file  . Transportation needs:    Medical: Not on file    Non-medical: Not on file  Tobacco Use  . Smoking status: Former Smoker    Packs/day: 1.00    Years: 20.00    Pack years: 20.00    Types: Cigarettes    Last attempt to quit: 06/19/1990    Years since quitting: 28.2  . Smokeless tobacco: Never Used  Substance and Sexual Activity  . Alcohol use: No    Alcohol/week: 0.0 standard drinks  . Drug use: No  . Sexual activity: Not on file  Lifestyle  . Physical activity:    Days per  week: Not on file    Minutes per session: Not on file  . Stress: Not on file  Relationships  . Social connections:    Talks on phone: Not on file    Gets together: Not on file    Attends religious service: Not on file    Active member of club or organization: Not on file    Attends meetings of clubs or organizations: Not on file    Relationship status: Not on file  Other Topics Concern  . Not on file  Social History Narrative  . Not on file   Past Surgical History:  Procedure Laterality Date  . IR CT HEAD LTD  07/06/2018  . IR PERCUTANEOUS ART  THROMBECTOMY/INFUSION INTRACRANIAL INC DIAG ANGIO  07/06/2018  . LEFT HEART CATH AND CORONARY ANGIOGRAPHY N/A 04/29/2017   Procedure: LEFT HEART CATH AND CORONARY ANGIOGRAPHY;  Surgeon: Belva Crome, MD;  Location: [place] CV LAB;  Service: Cardiovascular;  Laterality: N/A;  . RADIOLOGY WITH ANESTHESIA N/A 07/05/2018   Procedure: RADIOLOGY WITH ANESTHESIA;  Surgeon: Luanne Bras, MD;  Location: [place];  Service: Radiology;  Laterality: N/A;  . ULTRASOUND GUIDANCE FOR VASCULAR ACCESS  04/29/2017   Procedure: Ultrasound Guidance For Vascular Access;  Surgeon: Belva Crome, MD;  Location: [place] [facility];  Service: Cardiovascular;;   Past Medical History:  Diagnosis Date  . Anxiety   . Atrial fibrillation ([place])   . Chronic systolic heart failure ([place])   . COPD (chronic obstructive pulmonary disease) ([place])   . Coronary artery disease   . Hypertension   . Hypothyroidism   . Left renal mass    unconfirmed documentation of "kidney cancer", possibly cyst  . Pre-diabetes   . Stroke ([facility])    BP 123/64   Pulse (!) 55   Ht 5\' 4"  (1.626 m)   Wt 180 lb (81.6 kg)   SpO2 94%   BMI 30.90 kg/m   Opioid Risk Score:   Fall Risk Score:  `1  Depression screen PHQ 2/9  Depression screen PHQ 2/9 08/18/2018  Decreased Interest 2  Down, Depressed, Hopeless 0  PHQ - 2 Score 2  Altered sleeping 2  Tired, decreased energy 3  Change in appetite 2  Feeling bad or failure about yourself  1  Trouble concentrating 1  Moving slowly or fidgety/restless 3  Suicidal thoughts 0  PHQ-9 Score 14  Difficult doing work/chores Somewhat difficult     Review of Systems  Constitutional: Negative.   HENT: Negative.   Eyes: Negative.   Respiratory: Negative.   Cardiovascular: Negative.   Gastrointestinal: Negative.   Endocrine: Negative.   Genitourinary: Positive for difficulty urinating.  Musculoskeletal: Positive for arthralgias, gait problem and myalgias.  Skin: Negative.     Allergic/Immunologic: Negative.   Neurological: Positive for weakness.  Hematological: Negative.   Psychiatric/Behavioral: The patient is nervous/anxious.   All other systems reviewed and are negative.      Objective:   Physical Exam Vitals signs and nursing note reviewed.  Constitutional:      Appearance: Normal appearance.  HENT:     Head: Normocephalic and atraumatic.     Mouth/Throat:     Pharynx: Oropharynx is clear.  Eyes:     Extraocular Movements: Extraocular movements intact.     Conjunctiva/sclera: Conjunctivae normal.     Pupils: Pupils are equal, round, and reactive to light.  Musculoskeletal:     Comments: No pain with UE and LE ROM  Skin:  General: Skin is warm and dry.  Neurological:     Mental Status: She is alert.     Coordination: Coordination is intact.     Gait: Gait is intact.     Comments: Cannot assess orientation due to aphasia/apraxia  Gait using walker , no toe drag or knee instability     4=/5 Right HF, KE, ADF, Delt , Bi, tri , Grip  5/5 left Delt, Bi, tri, grip, HF, KE, ADF  Speech has intact naming, but difficulty with word finding , fluency is impaired        Assessment & Plan:  1.  Mild Right hemiparesis , apraxia and aphasia due to Left MCA distribution infarct in Nov 2019.  Plateaued from physical standpoint and now working mainly on aphasia/apraxia with SLP  Discussed time frame of recovery with daughter  No PMR f/u needed Will need neuro and PCP f/u  2.  Probable spastic bladder post stroke, timed toileting q2hr when await Discussed anticholinergic agents

## 2018-09-22 NOTE — Patient Instructions (Signed)
Oxybutnin, detrol or myrbetriq are meds that can slow down the bladder  Toilet yourself every 2 hours to see if

## 2018-09-24 DIAGNOSIS — E785 Hyperlipidemia, unspecified: Secondary | ICD-10-CM | POA: Diagnosis not present

## 2018-09-24 DIAGNOSIS — I5022 Chronic systolic (congestive) heart failure: Secondary | ICD-10-CM | POA: Diagnosis not present

## 2018-09-24 DIAGNOSIS — I69351 Hemiplegia and hemiparesis following cerebral infarction affecting right dominant side: Secondary | ICD-10-CM | POA: Diagnosis not present

## 2018-09-24 DIAGNOSIS — I2510 Atherosclerotic heart disease of native coronary artery without angina pectoris: Secondary | ICD-10-CM | POA: Diagnosis not present

## 2018-09-24 DIAGNOSIS — R7303 Prediabetes: Secondary | ICD-10-CM | POA: Diagnosis not present

## 2018-09-24 DIAGNOSIS — Z7901 Long term (current) use of anticoagulants: Secondary | ICD-10-CM | POA: Diagnosis not present

## 2018-09-24 DIAGNOSIS — E039 Hypothyroidism, unspecified: Secondary | ICD-10-CM | POA: Diagnosis not present

## 2018-09-24 DIAGNOSIS — Z791 Long term (current) use of non-steroidal anti-inflammatories (NSAID): Secondary | ICD-10-CM | POA: Diagnosis not present

## 2018-09-24 DIAGNOSIS — I4891 Unspecified atrial fibrillation: Secondary | ICD-10-CM | POA: Diagnosis not present

## 2018-09-24 DIAGNOSIS — I110 Hypertensive heart disease with heart failure: Secondary | ICD-10-CM | POA: Diagnosis not present

## 2018-09-24 DIAGNOSIS — J449 Chronic obstructive pulmonary disease, unspecified: Secondary | ICD-10-CM | POA: Diagnosis not present

## 2018-09-24 DIAGNOSIS — F419 Anxiety disorder, unspecified: Secondary | ICD-10-CM | POA: Diagnosis not present

## 2018-09-30 ENCOUNTER — Other Ambulatory Visit: Payer: Self-pay

## 2018-09-30 NOTE — Patient Outreach (Signed)
Telephone outreach to patient to obtain mRs was successfully completed. mRs= 1. 

## 2018-10-11 DIAGNOSIS — J209 Acute bronchitis, unspecified: Secondary | ICD-10-CM | POA: Diagnosis not present

## 2018-10-12 DIAGNOSIS — J440 Chronic obstructive pulmonary disease with acute lower respiratory infection: Secondary | ICD-10-CM | POA: Diagnosis not present

## 2018-10-12 DIAGNOSIS — R0602 Shortness of breath: Secondary | ICD-10-CM | POA: Diagnosis not present

## 2018-10-12 DIAGNOSIS — R062 Wheezing: Secondary | ICD-10-CM | POA: Diagnosis not present

## 2018-10-12 DIAGNOSIS — E039 Hypothyroidism, unspecified: Secondary | ICD-10-CM | POA: Diagnosis not present

## 2018-10-12 DIAGNOSIS — I340 Nonrheumatic mitral (valve) insufficiency: Secondary | ICD-10-CM

## 2018-10-12 DIAGNOSIS — I63512 Cerebral infarction due to unspecified occlusion or stenosis of left middle cerebral artery: Secondary | ICD-10-CM

## 2018-10-12 DIAGNOSIS — R8290 Unspecified abnormal findings in urine: Secondary | ICD-10-CM | POA: Diagnosis not present

## 2018-10-12 DIAGNOSIS — I4891 Unspecified atrial fibrillation: Secondary | ICD-10-CM | POA: Diagnosis not present

## 2018-10-12 DIAGNOSIS — J189 Pneumonia, unspecified organism: Secondary | ICD-10-CM | POA: Diagnosis not present

## 2018-10-12 DIAGNOSIS — I482 Chronic atrial fibrillation, unspecified: Secondary | ICD-10-CM | POA: Diagnosis not present

## 2018-10-12 DIAGNOSIS — E785 Hyperlipidemia, unspecified: Secondary | ICD-10-CM | POA: Diagnosis not present

## 2018-10-12 DIAGNOSIS — R0600 Dyspnea, unspecified: Secondary | ICD-10-CM | POA: Diagnosis not present

## 2018-10-12 DIAGNOSIS — Z79899 Other long term (current) drug therapy: Secondary | ICD-10-CM | POA: Diagnosis not present

## 2018-10-12 DIAGNOSIS — I5022 Chronic systolic (congestive) heart failure: Secondary | ICD-10-CM | POA: Diagnosis not present

## 2018-10-12 DIAGNOSIS — R0902 Hypoxemia: Secondary | ICD-10-CM | POA: Diagnosis not present

## 2018-10-12 DIAGNOSIS — J209 Acute bronchitis, unspecified: Secondary | ICD-10-CM | POA: Diagnosis not present

## 2018-10-12 DIAGNOSIS — I10 Essential (primary) hypertension: Secondary | ICD-10-CM | POA: Diagnosis not present

## 2018-10-12 DIAGNOSIS — Z7901 Long term (current) use of anticoagulants: Secondary | ICD-10-CM | POA: Diagnosis not present

## 2018-10-12 DIAGNOSIS — J80 Acute respiratory distress syndrome: Secondary | ICD-10-CM | POA: Diagnosis not present

## 2018-10-12 DIAGNOSIS — I2510 Atherosclerotic heart disease of native coronary artery without angina pectoris: Secondary | ICD-10-CM | POA: Diagnosis not present

## 2018-10-12 DIAGNOSIS — Z8673 Personal history of transient ischemic attack (TIA), and cerebral infarction without residual deficits: Secondary | ICD-10-CM | POA: Diagnosis not present

## 2018-10-13 DIAGNOSIS — I10 Essential (primary) hypertension: Secondary | ICD-10-CM | POA: Diagnosis not present

## 2018-10-13 DIAGNOSIS — I482 Chronic atrial fibrillation, unspecified: Secondary | ICD-10-CM | POA: Diagnosis not present

## 2018-10-13 DIAGNOSIS — E785 Hyperlipidemia, unspecified: Secondary | ICD-10-CM | POA: Diagnosis not present

## 2018-10-13 DIAGNOSIS — E039 Hypothyroidism, unspecified: Secondary | ICD-10-CM | POA: Diagnosis not present

## 2018-10-13 DIAGNOSIS — I2510 Atherosclerotic heart disease of native coronary artery without angina pectoris: Secondary | ICD-10-CM | POA: Diagnosis not present

## 2018-10-13 DIAGNOSIS — J440 Chronic obstructive pulmonary disease with acute lower respiratory infection: Secondary | ICD-10-CM | POA: Diagnosis not present

## 2018-10-13 DIAGNOSIS — I63512 Cerebral infarction due to unspecified occlusion or stenosis of left middle cerebral artery: Secondary | ICD-10-CM | POA: Diagnosis not present

## 2018-10-13 DIAGNOSIS — I5022 Chronic systolic (congestive) heart failure: Secondary | ICD-10-CM | POA: Diagnosis not present

## 2018-10-13 DIAGNOSIS — R0902 Hypoxemia: Secondary | ICD-10-CM | POA: Diagnosis not present

## 2018-10-13 DIAGNOSIS — Z8673 Personal history of transient ischemic attack (TIA), and cerebral infarction without residual deficits: Secondary | ICD-10-CM | POA: Diagnosis not present

## 2018-10-19 ENCOUNTER — Other Ambulatory Visit: Payer: Self-pay | Admitting: *Deleted

## 2018-10-19 NOTE — Patient Outreach (Signed)
[facility]) Care Management  10/19/2018  Kaitlyn Underwood 03-09-1941 [id number]   Referral received 3/2 Recent hospital d/c 2/25 [facility].  Initial outreach unsuccessful however RN able to leave a HIPAA approved voice message requesting a call back. Will rescheduled another outreach call this week to inquired further on possible discharge needs and attempt to completed the transition of care template.  Outreach letter sent and currently awaiting a response.   Raina Mina, RN Care Management Coordinator [place] Office [phone]

## 2018-10-22 ENCOUNTER — Other Ambulatory Visit: Payer: Self-pay | Admitting: *Deleted

## 2018-10-22 DIAGNOSIS — J440 Chronic obstructive pulmonary disease with acute lower respiratory infection: Secondary | ICD-10-CM | POA: Diagnosis not present

## 2018-10-22 DIAGNOSIS — N390 Urinary tract infection, site not specified: Secondary | ICD-10-CM | POA: Diagnosis not present

## 2018-10-22 DIAGNOSIS — I10 Essential (primary) hypertension: Secondary | ICD-10-CM | POA: Diagnosis not present

## 2018-10-22 DIAGNOSIS — Z6833 Body mass index (BMI) 33.0-33.9, adult: Secondary | ICD-10-CM | POA: Diagnosis not present

## 2018-10-22 DIAGNOSIS — I6930 Unspecified sequelae of cerebral infarction: Secondary | ICD-10-CM | POA: Diagnosis not present

## 2018-10-22 DIAGNOSIS — R32 Unspecified urinary incontinence: Secondary | ICD-10-CM | POA: Diagnosis not present

## 2018-10-22 DIAGNOSIS — J205 Acute bronchitis due to respiratory syncytial virus: Secondary | ICD-10-CM | POA: Diagnosis not present

## 2018-10-22 DIAGNOSIS — I482 Chronic atrial fibrillation, unspecified: Secondary | ICD-10-CM | POA: Diagnosis not present

## 2018-10-22 DIAGNOSIS — Z79899 Other long term (current) drug therapy: Secondary | ICD-10-CM | POA: Diagnosis not present

## 2018-10-22 NOTE — Patient Outreach (Signed)
[facility]) Care Management  10/22/2018  Kaitlyn Underwood [birth year] [id number]   Referral received 3/2 screening (2nd attempt)  RN spoke with pt who verified her identifiers and provided permission to speak with her daughter Juliann Pulse). Daughter indicated they were just leaving the provider's office and requested to call back. RN introduced [facility] and provided a contact number to return the call.  Addendum:  Daughter returned call with identifiers re-established. RN reintroduced the [facility] services and purpose for today's call. Daughter indicated pt's recent history with a stroke last year and discharged from the facility with [facility] services that has now ended. States pt continues to perform the requested exercises by the therapist and has returned to her normal state other then some ongiong tremors to her hands. Daughter states she manages pt's medications with no problems using a pill box. States there are 5 caregivers who participate in caring for pt and assistance with nutritional meals. Reports a fall out of bed getting to the bathroom  In January with no injuries. RN stress the importance of safety measures to assist with preventing falls and injuries. Caregiver also reports a recent hospital visit 10 days ago for pneumonia. Pt was treated and returned home with no additional issues reported. Based upon that recent hospital visit RN offered transition of care contacts as prevention measures were discussed. Caregiver declined indicating pt is doing well and has no needs to address at this time. Based upon this response pt will be non-active and no referral needed at this time.  Patient was recently discharged from hospital and all medications have been reviewed.  Raina Mina, RN Care Management Coordinator [place] Office [phone]

## 2018-11-18 ENCOUNTER — Telehealth: Payer: Self-pay

## 2018-11-18 NOTE — Telephone Encounter (Signed)
Unable to get in contact with the patient. I left a voicemail explaining that their appointment has been cancelled with Venancio Poisson, NP on 11/25/2018. I offered a telephone visit but I must have verbal consent before I can schedule it. Office number provided.

## 2018-11-23 DIAGNOSIS — I6930 Unspecified sequelae of cerebral infarction: Secondary | ICD-10-CM | POA: Diagnosis not present

## 2018-11-23 DIAGNOSIS — L821 Other seborrheic keratosis: Secondary | ICD-10-CM | POA: Diagnosis not present

## 2018-11-23 DIAGNOSIS — Z85828 Personal history of other malignant neoplasm of skin: Secondary | ICD-10-CM | POA: Diagnosis not present

## 2018-11-23 DIAGNOSIS — L814 Other melanin hyperpigmentation: Secondary | ICD-10-CM | POA: Diagnosis not present

## 2018-11-23 DIAGNOSIS — J449 Chronic obstructive pulmonary disease, unspecified: Secondary | ICD-10-CM | POA: Diagnosis not present

## 2018-11-23 DIAGNOSIS — D225 Melanocytic nevi of trunk: Secondary | ICD-10-CM | POA: Diagnosis not present

## 2018-11-23 NOTE — Telephone Encounter (Signed)
Left another vm explaining the COVID 19 virus. Explain not seeing pts in the office. We are offering telephone visits or video visit. We need consent to do either one and to file insurance.

## 2018-11-25 ENCOUNTER — Ambulatory Visit (INDEPENDENT_AMBULATORY_CARE_PROVIDER_SITE_OTHER): Payer: PPO | Admitting: Adult Health

## 2018-11-25 ENCOUNTER — Encounter: Payer: Self-pay | Admitting: Adult Health

## 2018-11-25 ENCOUNTER — Other Ambulatory Visit: Payer: Self-pay

## 2018-11-25 DIAGNOSIS — E785 Hyperlipidemia, unspecified: Secondary | ICD-10-CM | POA: Diagnosis not present

## 2018-11-25 DIAGNOSIS — I63512 Cerebral infarction due to unspecified occlusion or stenosis of left middle cerebral artery: Secondary | ICD-10-CM | POA: Diagnosis not present

## 2018-11-25 DIAGNOSIS — R41841 Cognitive communication deficit: Secondary | ICD-10-CM

## 2018-11-25 DIAGNOSIS — I480 Paroxysmal atrial fibrillation: Secondary | ICD-10-CM

## 2018-11-25 DIAGNOSIS — I10 Essential (primary) hypertension: Secondary | ICD-10-CM | POA: Diagnosis not present

## 2018-11-25 DIAGNOSIS — R4701 Aphasia: Secondary | ICD-10-CM | POA: Diagnosis not present

## 2018-11-25 NOTE — Progress Notes (Signed)
[facility] [address] [place]. [place] [address] [phone]     Virtual Visit via Telephone Note  I connected with Kaitlyn Underwood on 11/25/18 at 12:45 PM EDT by telephone located remotely within my own home and verified that I am speaking with the correct person using two identifiers who reports being located within their own home.    I discussed the limitations, risks, security and privacy concerns of performing an evaluation and management service by telephone and the availability of in person appointments. I also discussed with the patient that there may be a patient responsible charge related to this service. The patient expressed understanding and agreed to proceed.   History of Present Illness:  Kaitlyn Underwood is a 78 y.o. female who continues to be followed in this office after left MCA territory infarct in 06/2018.  She was initially scheduled for face-to-face office visit today at this time but due to [place], face-to-face office visit rescheduled for non-face-to-face telephone visit.   At prior visit on 08/25/2018, she continued to have residual stroke deficits of moderate expressive aphasia, cognitive deficits and right hemiparesis but overall improving. She endorses continued improvement of speech with occasional difficulties, memory and cognition improving, and right sided weakness with right leg weakness and resolution of arm weakness.  She does continue to use rolling walker for ambulation assistance but denies any recent falls.  She continues to live with her husband with daughters checking on her daily.  She is able to maintain all ADLs independently.  She completed home therapies and did not do additional outpatient therapies.  She has continued on Eliquis without side effects of bleeding or bruising.  Continues on pravastatin without side effects myalgias.  She will occasionally monitor blood pressure and systolic typically ranges 120s to 130s.  She does not  have any concerns at this time.  Denies new or worsening stroke/TIA symptoms.      Observations/Objective:  *Limited exam due to visit type*  General: Pleasant elderly Caucasian female answering and asking questions appropriately throughout conversation  Mental status: Mild expressive aphasia.  Oriented to place and time.  Fund of knowledge, concentration and attention span appropriate and able to answer questions appropriately.   Assessment and Plan:  Kaitlyn Underwood is a 78 y.o. year old female with left MCA infarct left M2 occlusion status post TPA and IR with TICI 3 reperfusion embolic pattern secondary to AF not on AC on 07/05/2018. Vascular risk factors include AF not on AC, CHF, CAD, HTN, and HLD   She endorses doing well since prior visit with improvement of expressive aphasia, memory and right-sided weakness which is only residual in RLE and denies any additional weakness in RUE.  1. Left MCA infarct: Continue Eliquis (apixaban) daily  and pravastatin for secondary stroke prevention. Maintain strict control of hypertension with blood pressure goal below 130/90, diabetes with hemoglobin A1c goal below 6.5% and cholesterol with LDL cholesterol (bad cholesterol) goal below 70 mg/dL.  I also advised the patient to eat a healthy diet with plenty of whole grains, cereals, fruits and vegetables, exercise regularly with at least 30 minutes of continuous activity daily and maintain ideal body weight. 2. HTN: Advised to continue current treatment regimen.  Advised to continue to monitor at home along with continued follow-up with PCP for management 3. HLD: Advised to continue current treatment regimen along with continued follow-up with PCP for future prescribing and monitoring of lipid panel 4. Atrial fibrillation: Continuation of Eliquis and  ongoing follow-up with cardiology for monitoring and management 5. Residual deficits: Recommended continuation of home exercises and if interested, referral  can be placed for additional outpatient therapies once able   Follow Up Instructions:   Follow-up in 6 months or call earlier if needed    I discussed the assessment and treatment plan with the patient.  The patient was provided an opportunity to ask questions and all were answered to their satisfaction. The patient agreed with the plan and verbalized an understanding of the instructions.   I provided 24 minutes of non-face-to-face time during this encounter.    Venancio Poisson, AGNP-BC  [facility] [address] [place], [place] [address]  Phone [phone] Fax [phone] Note: This document was prepared with digital dictation and possible smart phrase technology. Any transcriptional errors that result from this process are unintentional.

## 2018-11-25 NOTE — Patient Instructions (Signed)
Continue Eliquis (apixaban) daily  and pravastatin for secondary stroke prevention  Continue to follow with cardiology for atrial fibrillation and Eliquis management  Continue to follow up with PCP regarding cholesterol and blood pressure management   Continue to stay active along with continuation of home exercises as recommended during therapy sessions.  If you feel as though you would like additional therapy sessions outpatient, please notify office and referral will be placed  Continue to monitor blood pressure at home  Maintain strict control of hypertension with blood pressure goal below 130/90, diabetes with hemoglobin A1c goal below 6.5% and cholesterol with LDL cholesterol (bad cholesterol) goal below 70 mg/dL. I also advised the patient to eat a healthy diet with plenty of whole grains, cereals, fruits and vegetables, exercise regularly and maintain ideal body weight.  Followup in the future with me in 6 months or call earlier if needed       Thank you for coming to see [place] at [facility]. I hope we have been able to provide you high quality care today.  You may receive a patient satisfaction survey over the next few weeks. We would appreciate your feedback and comments so that we may continue to improve ourselves and the health of our patients.

## 2018-11-29 NOTE — Progress Notes (Signed)
I agree with the above plan 

## 2019-03-11 DIAGNOSIS — E785 Hyperlipidemia, unspecified: Secondary | ICD-10-CM | POA: Diagnosis not present

## 2019-03-11 DIAGNOSIS — Z9181 History of falling: Secondary | ICD-10-CM | POA: Diagnosis not present

## 2019-03-11 DIAGNOSIS — Z6833 Body mass index (BMI) 33.0-33.9, adult: Secondary | ICD-10-CM | POA: Diagnosis not present

## 2019-03-11 DIAGNOSIS — Z0000 Encounter for general adult medical examination without abnormal findings: Secondary | ICD-10-CM | POA: Diagnosis not present

## 2019-03-11 DIAGNOSIS — Z1331 Encounter for screening for depression: Secondary | ICD-10-CM | POA: Diagnosis not present

## 2019-03-12 DIAGNOSIS — E7800 Pure hypercholesterolemia, unspecified: Secondary | ICD-10-CM | POA: Diagnosis not present

## 2019-03-12 DIAGNOSIS — I110 Hypertensive heart disease with heart failure: Secondary | ICD-10-CM | POA: Diagnosis not present

## 2019-03-12 DIAGNOSIS — R531 Weakness: Secondary | ICD-10-CM | POA: Diagnosis not present

## 2019-03-12 DIAGNOSIS — Z79899 Other long term (current) drug therapy: Secondary | ICD-10-CM | POA: Diagnosis not present

## 2019-03-12 DIAGNOSIS — J441 Chronic obstructive pulmonary disease with (acute) exacerbation: Secondary | ICD-10-CM | POA: Diagnosis not present

## 2019-03-12 DIAGNOSIS — I4891 Unspecified atrial fibrillation: Secondary | ICD-10-CM | POA: Diagnosis not present

## 2019-03-12 DIAGNOSIS — Z87891 Personal history of nicotine dependence: Secondary | ICD-10-CM | POA: Diagnosis not present

## 2019-03-12 DIAGNOSIS — Z8673 Personal history of transient ischemic attack (TIA), and cerebral infarction without residual deficits: Secondary | ICD-10-CM | POA: Diagnosis not present

## 2019-03-12 DIAGNOSIS — R062 Wheezing: Secondary | ICD-10-CM | POA: Diagnosis not present

## 2019-03-12 DIAGNOSIS — I509 Heart failure, unspecified: Secondary | ICD-10-CM | POA: Diagnosis not present

## 2019-03-12 DIAGNOSIS — I2510 Atherosclerotic heart disease of native coronary artery without angina pectoris: Secondary | ICD-10-CM | POA: Diagnosis not present

## 2019-03-12 DIAGNOSIS — Z7901 Long term (current) use of anticoagulants: Secondary | ICD-10-CM | POA: Diagnosis not present

## 2019-03-15 DIAGNOSIS — N3001 Acute cystitis with hematuria: Secondary | ICD-10-CM | POA: Diagnosis not present

## 2019-03-15 DIAGNOSIS — F17210 Nicotine dependence, cigarettes, uncomplicated: Secondary | ICD-10-CM | POA: Diagnosis not present

## 2019-03-15 DIAGNOSIS — N390 Urinary tract infection, site not specified: Secondary | ICD-10-CM | POA: Diagnosis not present

## 2019-03-15 DIAGNOSIS — R0602 Shortness of breath: Secondary | ICD-10-CM | POA: Diagnosis not present

## 2019-03-15 DIAGNOSIS — Z87891 Personal history of nicotine dependence: Secondary | ICD-10-CM | POA: Diagnosis not present

## 2019-03-15 DIAGNOSIS — R05 Cough: Secondary | ICD-10-CM | POA: Diagnosis not present

## 2019-03-15 DIAGNOSIS — B9689 Other specified bacterial agents as the cause of diseases classified elsewhere: Secondary | ICD-10-CM | POA: Diagnosis not present

## 2019-03-15 DIAGNOSIS — J441 Chronic obstructive pulmonary disease with (acute) exacerbation: Secondary | ICD-10-CM | POA: Diagnosis not present

## 2019-03-18 IMAGING — XA IR  CT HEAD LIMITED
2 series · 9 of 24 positions shown · non-contrast
Comparison: none

INDICATION: Global aphasia. Right-sided hemiplegia. Occluded inferior division
of the left middle cerebral artery M2 M3 region
TECHNIQUE: Following a full explanation of the procedure along with the
potential associated complications, an informed witnessed consent
was obtained from the patient's husband. The risks of intracranial
hemorrhage of 10%, worsening neurological deficit, ventilator
dependency, death and inability to revascularize were all reviewed
in detail with the patient's husband.

[Series 17: post axial 2nd pass · axial · 2.0mm · 0.17mm/px · z∈[-201,-105]mm · 4 of 69 slices shown]
[im 7/69]
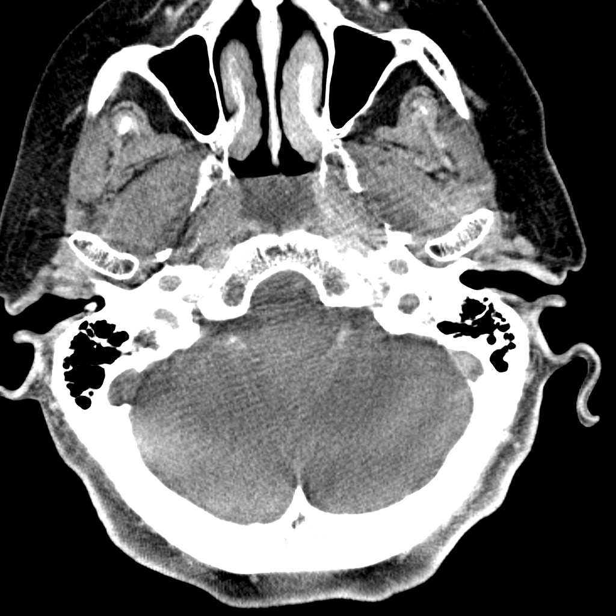
[im 25/69]
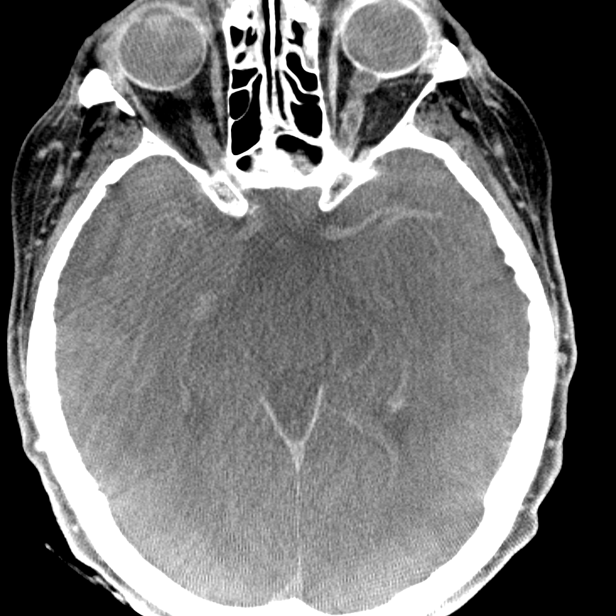
[im 38/69]
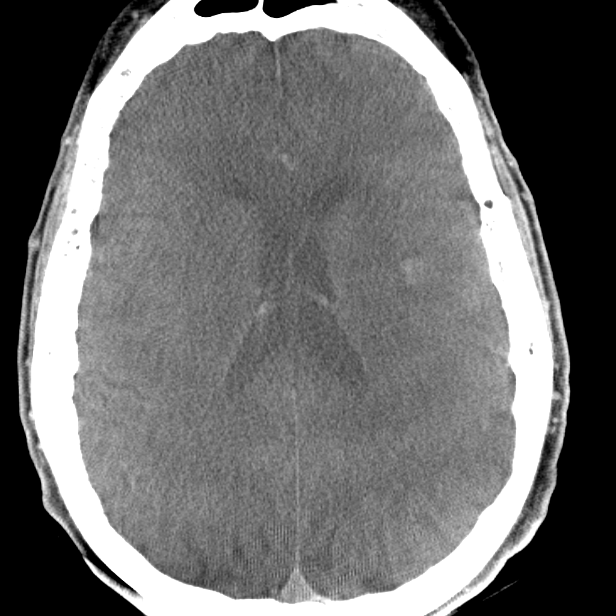
[im 56/69]
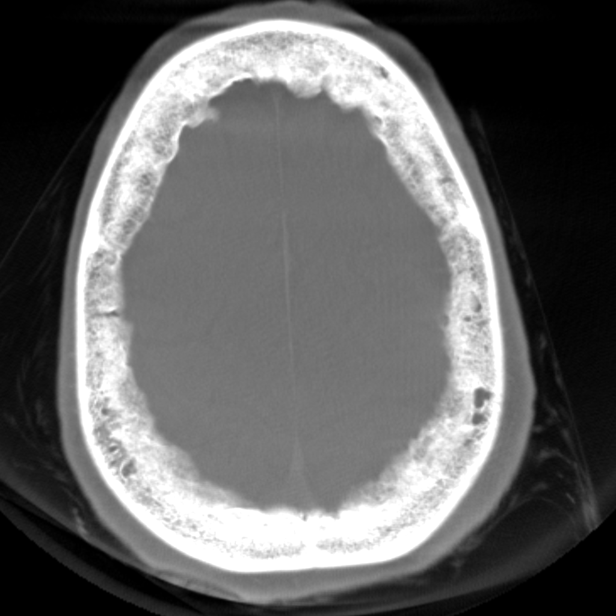

[Series 27: post axial · axial · 2.0mm · 0.17mm/px · z∈[-220,-98]mm · 5 of 70 slices shown]
[im 1/70]
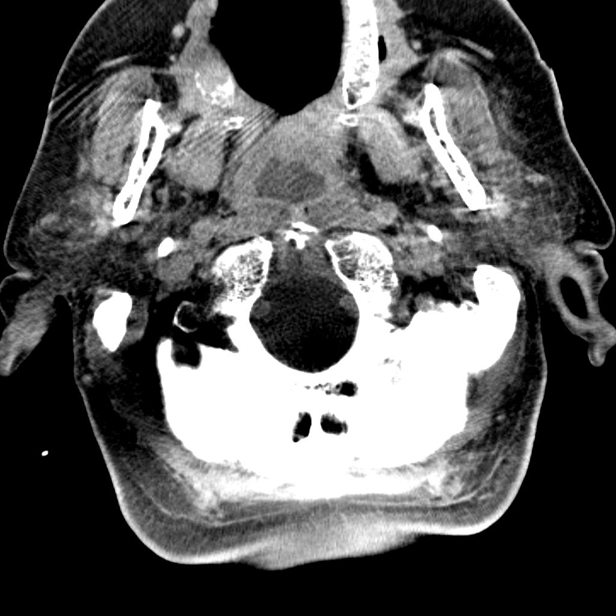
[im 13/70]
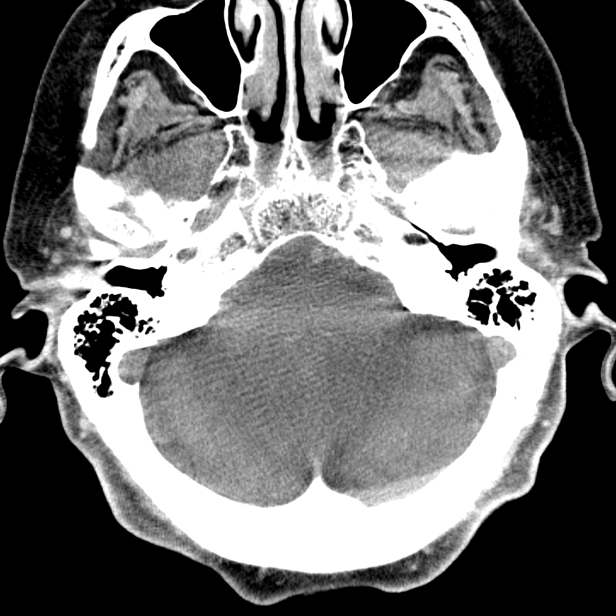
[im 32/70]
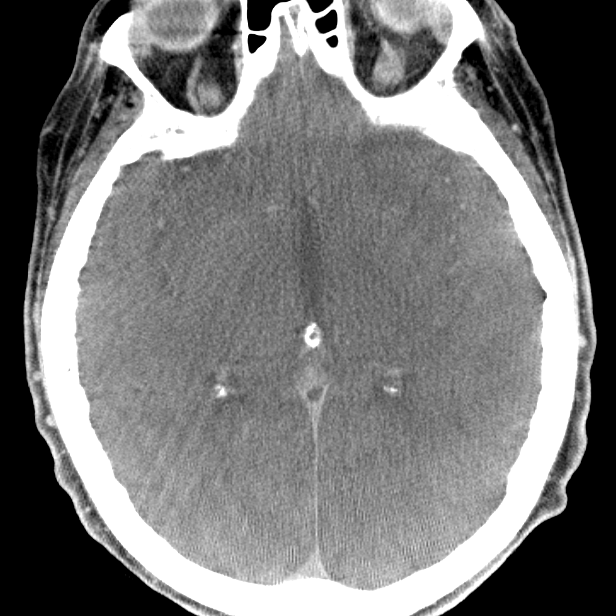
[im 51/70]
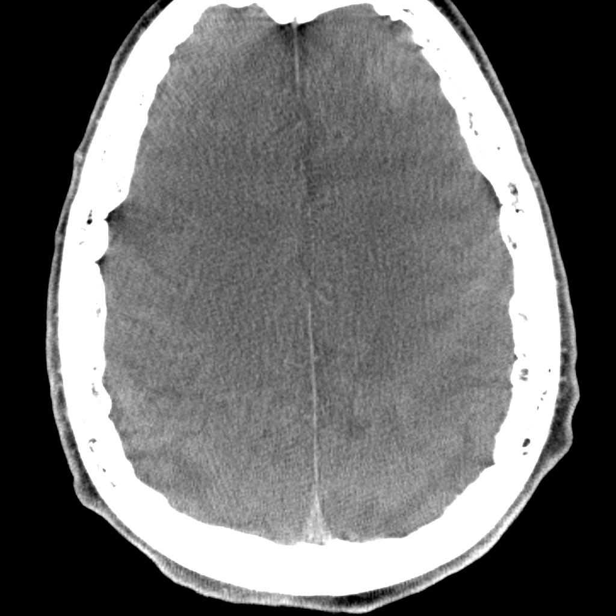
[im 63/70]
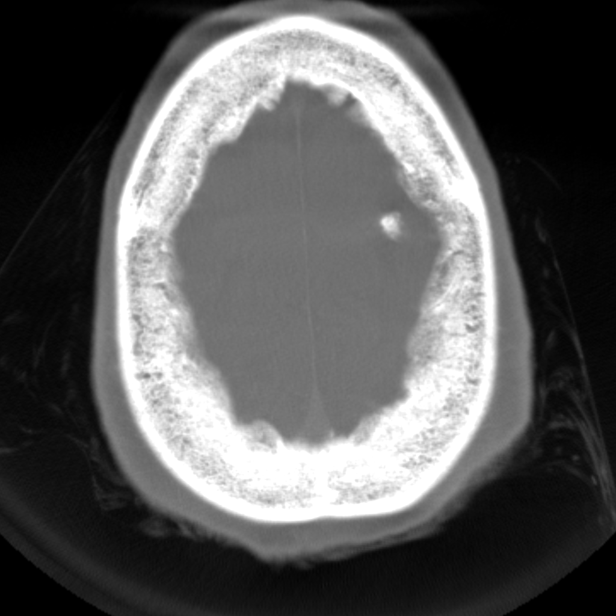

[9 of 24 positions shown; findings below may reference images not displayed]

EXAM:
1. EMERGENT LARGE VESSEL OCCLUSION THROMBOLYSIS (POSTERIOR
CIRCULATION)

MEDICATIONS:
No antibiotic was administered within 1 hour of the procedure.

ANESTHESIA/SEDATION:
General anesthesia

CONTRAST:  Isovue 300 approximately 110 mL.

FLUOROSCOPY TIME:  Fluoroscopy Time: 73 minutes 24 seconds (4232
mGy).

COMPLICATIONS:
None immediate.
The patient was then put under general anesthesia by the [REDACTED] at [HOSPITAL].

The right groin was prepped and draped in the usual sterile fashion.
Thereafter using modified Seldinger technique, transfemoral access
into the right common femoral artery was obtained without
difficulty. Over a 0.035 inch guidewire a 5 French Pinnacle sheath
was inserted. Through this, and also over a 0.035 inch guidewire a 5
French JB 1 catheter was advanced to the aortic arch region and
selectively positioned in the left common carotid artery.
FINDINGS: The left common carotid arteriogram demonstrates the left external
carotid artery and its major branches to be widely patent.

The left internal carotid artery at the bulb demonstrates
approximately 60% stenosis by the NASCET criteria secondary to a
smooth posterior wall atherosclerotic plaque.

Distal to this the left internal carotid artery is seen to opacify
to the cranial skull base. The petrous, cavernous and supraclinoid
segments are widely patent.

A left posterior communicating artery is seen opacifying the left
posterior cerebral artery distribution.

There is a duplicated left middle cerebral artery with duplicated
trifurcation branches.

The inferior division from the superior duplicated branch
demonstrates complete angiographic occlusion.

The left anterior cerebral artery opacifies into the capillary and
venous phases.

The lateral projection of the whole head run demonstrates a large
area of hypoperfusion involving the left parietal and posterior
frontal regions.

PROCEDURE:
ENDOVASCULAR REVASCULARIZATION OF OCCLUDED INFERIOR DIVISION OF THE
UPPER BRANCH OF THE LEFT MIDDLE CEREBRAL ARTERY, WITH 2 PASSES WITH
THE TREVO PROVUE 3 MM X 20 MM RETRIEVAL DEVICE, AND 1 PASS WITH THE
5 MM X 33 MM EMBOTRAP RETRIEVAL DEVICE ACHIEVING A TICI 3
REVASCULARIZATION.

The diagnostic JB 1 catheter in the left common carotid artery was
exchanged over a 0.035 inch 300 cm Rosen exchange guidewire for an 8
French 55 cm Brite tip neurovascular sheath using biplane roadmap
technique and constant fluoroscopic guidance. Good aspiration
obtained from the hub of the neurovascular sheath.

This was then connected to continuous heparinized saline infusion.
Over the Rosen exchange guidewire, an 8 French 85 cm FlowGate
balloon guide catheter which had been prepped with 50% contrast and
50% heparinized saline infusion was positioned just proximal to the
left common carotid bifurcation. The guidewire was removed. Good
aspiration obtained from the hub of the FlowGate guide catheter.
Gentle contrast injection demonstrated no evidence of spasms,
dissections or of intraluminal filling defects.

A combination of a Catalyst 5 French 132 cm guide catheter inside of
which was an 021 Trevo ProVue microcatheter was advanced over a
0.014 inch Softip Synchro micro guidewire to the distal end of the
FlowGate guide catheter.

With the micro guidewire leading with a J-tip configuration to avoid
dissections or inducing spasm, the combination was navigated without
difficulty to the supraclinoid left ICA.

The superior division was then selected with the micro guidewire
followed by the microcatheter. Access through the occluded inferior
division was then obtained with the micro guidewire to the M2 M3
regions followed by the microcatheter. The guidewire was removed.
Good aspiration obtained from the hub of the microcatheter. A gentle
contrast injection demonstrated slow distal antegrade flow. The
microcatheter was then connected to continuous heparinized saline
infusion.

A 3 mm x 20 mm Trevo ProVue retrieval device was advanced to the
distal end of microcatheter. The proximal and the distal landing
zones were then defined. The O ring on the delivery microcatheter
was loosened. With slight forward gentle traction with the right
hand on the delivery micro guidewire, with the left hand the
delivery microcatheter was retrieved unsheathing the retrieval
device. A control arteriogram performed through the Catalyst guide
catheter in the supraclinoid left ICA demonstrated partial
revascularization of the inferior division of the left middle
cerebral artery. With proximal flow arrest in the left common
carotid artery at the site of bifurcation, the combination of the
retrieval device, the microcatheter, and the Catalyst guide catheter
was retrieved as constant aspiration was applied with a 60 mL
syringe at the hub of the FlowGate guide catheter, and a Penumbra
vacuum suction device at the hub of the Catalyst guide catheter. The
combinations were retrieved and removed. No evidence of clot was
seen in the retrieval device, or in the aspirate.

Aspiration was continued as the proximal flow arrest was reversed. A
control arteriogram performed through the FlowGate guide catheter in
the left common carotid artery demonstrated no change in the
occluded inferior division of the superior branch of the duplicated
left MCA.

A second attempt was then made again using the above combination.
Again access into the distal M2 M3 region of the occluded vessel was
obtained with the micro guidewire followed by the microcatheter.
After having verified safe position of tip of the microcatheter, the
3 mm x 20 mm Trevo ProVue device was again deployed as above. With
proximal flow arrest in the left common carotid artery by inflating
the balloon of the FlowGate guide catheter, the combination of the
retrieval device, microcatheter and the Catalyst guide catheter were
retrieved and removed. Again aspiration was applied with a 60 mL
syringe at the hub of the FlowGate guide catheter, and the Penumbra
suction device at the hub of the Catalyst guide catheter. Aspiration
was continued as flow arrest was reversed. A control arteriogram
performed again through the FlowGate guide catheter in the left
common carotid artery continued to demonstrate no real opacification
of the inferior division of the superior branch of the duplicated
left middle cerebral artery.

A controlled arteriogram performed was then made using the
combination of the Trevo ProVue microcatheter inside of a 5 French
132 cm Catalyst guide catheter which was advanced to the left middle
cerebral artery over a 0.014 inch Softip Synchro micro guidewire.
Again access into the occluded inferior division was obtained with
the micro guidewire followed by the microcatheter which was
positioned in the M3 region of the left middle cerebral artery. The
guidewire was removed. Again after having verified safe position of
the tip of the microcatheter, a 5 mm x 33 mm Embotrap retrieval
device was advanced to the distal end of the microcatheter. The
proximal and the distal landing zones were defined.

With proximal flow arrest in the left internal carotid artery, the
combination of the retrieval device, the microcatheter, and the 5
French 132 cm Catalyst guide catheter were retrieved and removed as
constant aspiration was applied with a 60 mL syringe at the hub of
the FlowGate guide catheter, and the Penumbra suction device at the
hub of the Catalyst guide catheter. The combination was retrieved
and removed. A clot was noted entangled and the cells of the
retrieval device. The aspirate was continued as flow arrest was
reversed by deflating the balloon in the left common carotid artery.
An arteriogram performed with free back bleed of blood at the hub of
the FlowGate guide catheter demonstrated now complete
revascularization of the previously occluded inferior division of
the superior branch of the left middle cerebral artery.

A control arteriogram perfor[REDACTED]ed over the whole head now
demonstrated reperfusion of the previously noted hypoperfused
parietal and the posterior frontal regions. A TICI 3
revascularization had been achieved of the left middle cerebral
distribution.

A control arteriogram performed through the FlowGate guide catheter
in the left common carotid artery centered over the carotid
bifurcation continued to demonstrate 60% stenosis of the proximal
left internal carotid artery at the bulb. No evidence of dissections
or of intraluminal filling defects were noted extra cranially.

The 8 French FlowGate guide catheter, and the 8 French neurovascular
sheath were then removed over a J-tip guidewire for an 8 French
Pinnacle sheath. This was then removed with the successful
application of a 7 Sadayoshi Asson closure device achieving
hemostasis.

Distal pulses in both feet were Dopplerable in the dorsalis pedis,
and the posterior tibial regions bilaterally unchanged from prior to
the beginning of the procedure.

The right groin appeared soft without evidence of hematoma or
bleeding. A Dyna CT of the brain performed demonstrated a focal area
of hyperdensity in the subcortical insular area lateral to the
putamen and a small one just cranial and lateral to this.

No known gross hemorrhages, or mass effect or midline shift was
noted. The patient was left intubated on account of her difficulty
with comprehension prior to the intubation.

The patient was then transferred to the neuro ICU to continue with
post thrombectomy management.
IMPRESSION: Status post endovascular complete revascularization of occluded
inferior division of the superior duplicated left middle cerebral
artery with 2 passes with the 3 mm x 20 mm Trevo ProVue retrieval
device, and 1 pass with the Embotrap 5 mm x 33 mm retrieval device
achieving a revascularization.

PLAN:
Follow-up in the clinic in 4 to 6 weeks post discharge.

## 2019-03-21 IMAGING — DX DG CHEST 1V PORT
1 series · 1 of 1 positions shown · non-contrast
Comparison: Radiograph July 07, 2018.

CLINICAL DATA: Congestive heart failure.

EXAM:
PORTABLE CHEST 1 VIEW

[chest]
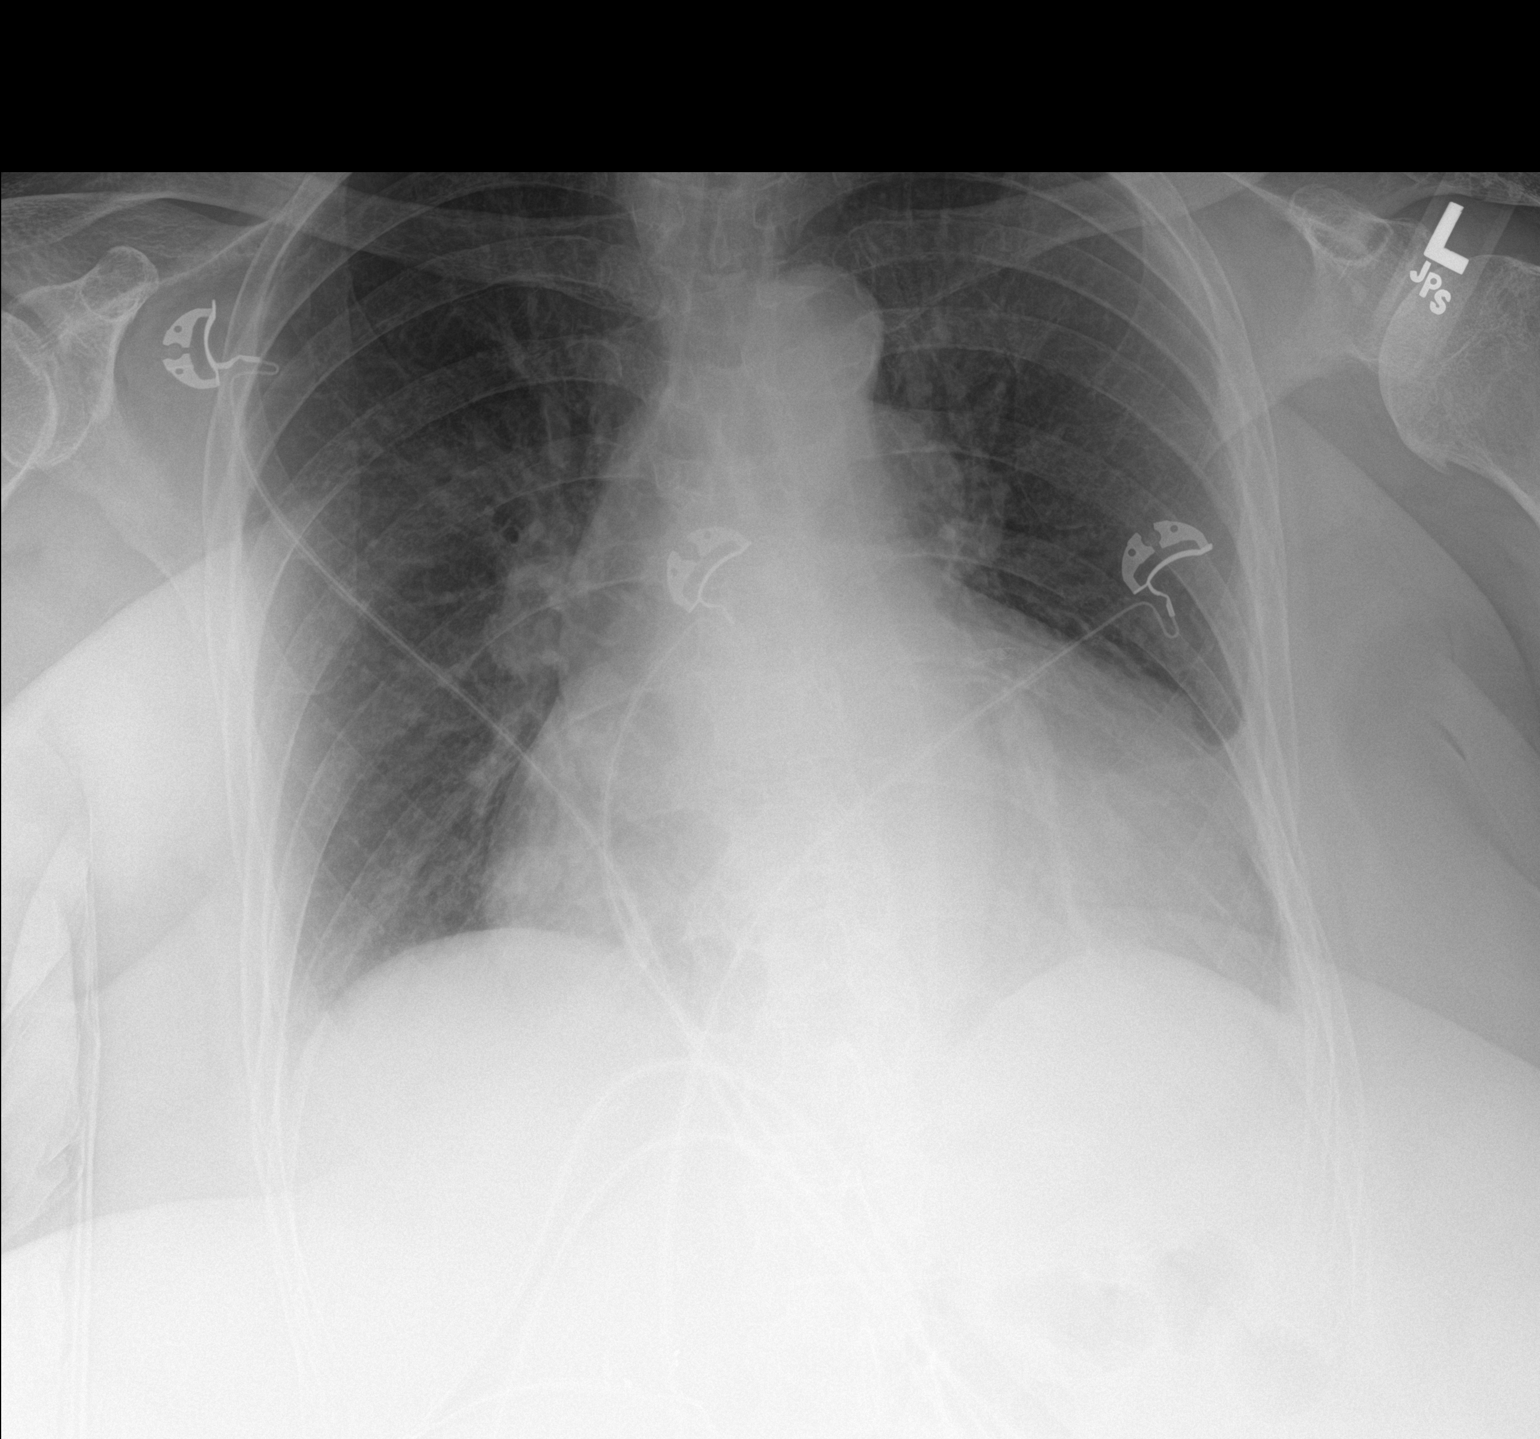

[1 of 1 positions shown; findings below may reference images not displayed]

FINDINGS: Stable cardiomegaly. No pneumothorax or pleural effusion is noted.
Both lungs are clear. The visualized skeletal structures are
unremarkable.
IMPRESSION: Stable cardiomegaly.  No acute abnormality seen.

## 2019-04-05 DIAGNOSIS — M545 Low back pain: Secondary | ICD-10-CM | POA: Diagnosis not present

## 2019-04-05 DIAGNOSIS — B029 Zoster without complications: Secondary | ICD-10-CM | POA: Diagnosis not present

## 2019-04-05 DIAGNOSIS — Z6834 Body mass index (BMI) 34.0-34.9, adult: Secondary | ICD-10-CM | POA: Diagnosis not present

## 2019-04-05 DIAGNOSIS — J209 Acute bronchitis, unspecified: Secondary | ICD-10-CM | POA: Diagnosis not present

## 2019-04-05 DIAGNOSIS — F330 Major depressive disorder, recurrent, mild: Secondary | ICD-10-CM | POA: Diagnosis not present

## 2019-04-05 DIAGNOSIS — J440 Chronic obstructive pulmonary disease with acute lower respiratory infection: Secondary | ICD-10-CM | POA: Diagnosis not present

## 2019-04-05 DIAGNOSIS — I10 Essential (primary) hypertension: Secondary | ICD-10-CM | POA: Diagnosis not present

## 2019-04-05 DIAGNOSIS — I6930 Unspecified sequelae of cerebral infarction: Secondary | ICD-10-CM | POA: Diagnosis not present

## 2019-04-18 ENCOUNTER — Encounter: Payer: Self-pay | Admitting: Internal Medicine

## 2019-04-18 ENCOUNTER — Observation Stay: Admit: 2019-04-18 | Payer: Self-pay | Admitting: Internal Medicine

## 2019-04-18 ENCOUNTER — Inpatient Hospital Stay (HOSPITAL_COMMUNITY)
Admission: AD | Admit: 2019-04-18 | Discharge: 2019-04-22 | DRG: 378 | Disposition: A | Discharge status: Home / Self Care | Payer: PPO | Source: Other Acute Inpatient Hospital | Attending: Internal Medicine | Admitting: Internal Medicine

## 2019-04-18 DIAGNOSIS — K921 Melena: Secondary | ICD-10-CM | POA: Diagnosis not present

## 2019-04-18 DIAGNOSIS — I2510 Atherosclerotic heart disease of native coronary artery without angina pectoris: Secondary | ICD-10-CM | POA: Diagnosis present

## 2019-04-18 DIAGNOSIS — E088 Diabetes mellitus due to underlying condition with unspecified complications: Secondary | ICD-10-CM | POA: Diagnosis present

## 2019-04-18 DIAGNOSIS — Z882 Allergy status to sulfonamides status: Secondary | ICD-10-CM | POA: Diagnosis not present

## 2019-04-18 DIAGNOSIS — Z8619 Personal history of other infectious and parasitic diseases: Secondary | ICD-10-CM

## 2019-04-18 DIAGNOSIS — K2950 Unspecified chronic gastritis without bleeding: Secondary | ICD-10-CM | POA: Diagnosis not present

## 2019-04-18 DIAGNOSIS — F419 Anxiety disorder, unspecified: Secondary | ICD-10-CM | POA: Diagnosis present

## 2019-04-18 DIAGNOSIS — D649 Anemia, unspecified: Secondary | ICD-10-CM | POA: Diagnosis not present

## 2019-04-18 DIAGNOSIS — R195 Other fecal abnormalities: Secondary | ICD-10-CM | POA: Diagnosis not present

## 2019-04-18 DIAGNOSIS — Z86010 Personal history of colonic polyps: Secondary | ICD-10-CM | POA: Diagnosis not present

## 2019-04-18 DIAGNOSIS — Z87891 Personal history of nicotine dependence: Secondary | ICD-10-CM

## 2019-04-18 DIAGNOSIS — D509 Iron deficiency anemia, unspecified: Secondary | ICD-10-CM | POA: Diagnosis present

## 2019-04-18 DIAGNOSIS — Z955 Presence of coronary angioplasty implant and graft: Secondary | ICD-10-CM

## 2019-04-18 DIAGNOSIS — Z7951 Long term (current) use of inhaled steroids: Secondary | ICD-10-CM

## 2019-04-18 DIAGNOSIS — K922 Gastrointestinal hemorrhage, unspecified: Secondary | ICD-10-CM

## 2019-04-18 DIAGNOSIS — R627 Adult failure to thrive: Secondary | ICD-10-CM | POA: Diagnosis present

## 2019-04-18 DIAGNOSIS — J449 Chronic obstructive pulmonary disease, unspecified: Secondary | ICD-10-CM | POA: Diagnosis present

## 2019-04-18 DIAGNOSIS — K449 Diaphragmatic hernia without obstruction or gangrene: Secondary | ICD-10-CM | POA: Diagnosis present

## 2019-04-18 DIAGNOSIS — K5731 Diverticulosis of large intestine without perforation or abscess with bleeding: Secondary | ICD-10-CM | POA: Diagnosis present

## 2019-04-18 DIAGNOSIS — K259 Gastric ulcer, unspecified as acute or chronic, without hemorrhage or perforation: Secondary | ICD-10-CM | POA: Diagnosis not present

## 2019-04-18 DIAGNOSIS — K250 Acute gastric ulcer with hemorrhage: Secondary | ICD-10-CM | POA: Diagnosis present

## 2019-04-18 DIAGNOSIS — Z8673 Personal history of transient ischemic attack (TIA), and cerebral infarction without residual deficits: Secondary | ICD-10-CM

## 2019-04-18 DIAGNOSIS — I5022 Chronic systolic (congestive) heart failure: Secondary | ICD-10-CM | POA: Diagnosis present

## 2019-04-18 DIAGNOSIS — D120 Benign neoplasm of cecum: Secondary | ICD-10-CM | POA: Diagnosis present

## 2019-04-18 DIAGNOSIS — K2980 Duodenitis without bleeding: Secondary | ICD-10-CM | POA: Diagnosis not present

## 2019-04-18 DIAGNOSIS — Z8249 Family history of ischemic heart disease and other diseases of the circulatory system: Secondary | ICD-10-CM

## 2019-04-18 DIAGNOSIS — K5730 Diverticulosis of large intestine without perforation or abscess without bleeding: Secondary | ICD-10-CM | POA: Diagnosis not present

## 2019-04-18 DIAGNOSIS — Z79899 Other long term (current) drug therapy: Secondary | ICD-10-CM | POA: Diagnosis not present

## 2019-04-18 DIAGNOSIS — K2981 Duodenitis with bleeding: Secondary | ICD-10-CM | POA: Diagnosis present

## 2019-04-18 DIAGNOSIS — I110 Hypertensive heart disease with heart failure: Secondary | ICD-10-CM | POA: Diagnosis present

## 2019-04-18 DIAGNOSIS — R079 Chest pain, unspecified: Secondary | ICD-10-CM | POA: Diagnosis not present

## 2019-04-18 DIAGNOSIS — J42 Unspecified chronic bronchitis: Secondary | ICD-10-CM | POA: Diagnosis not present

## 2019-04-18 DIAGNOSIS — I482 Chronic atrial fibrillation, unspecified: Secondary | ICD-10-CM | POA: Diagnosis present

## 2019-04-18 DIAGNOSIS — K254 Chronic or unspecified gastric ulcer with hemorrhage: Secondary | ICD-10-CM | POA: Diagnosis not present

## 2019-04-18 DIAGNOSIS — R531 Weakness: Secondary | ICD-10-CM | POA: Diagnosis not present

## 2019-04-18 DIAGNOSIS — K264 Chronic or unspecified duodenal ulcer with hemorrhage: Secondary | ICD-10-CM | POA: Diagnosis present

## 2019-04-18 DIAGNOSIS — Z6833 Body mass index (BMI) 33.0-33.9, adult: Secondary | ICD-10-CM | POA: Diagnosis not present

## 2019-04-18 DIAGNOSIS — Z7901 Long term (current) use of anticoagulants: Secondary | ICD-10-CM

## 2019-04-18 DIAGNOSIS — K2970 Gastritis, unspecified, without bleeding: Secondary | ICD-10-CM | POA: Diagnosis not present

## 2019-04-18 DIAGNOSIS — Z20828 Contact with and (suspected) exposure to other viral communicable diseases: Secondary | ICD-10-CM | POA: Diagnosis present

## 2019-04-18 DIAGNOSIS — E785 Hyperlipidemia, unspecified: Secondary | ICD-10-CM | POA: Diagnosis present

## 2019-04-18 DIAGNOSIS — E039 Hypothyroidism, unspecified: Secondary | ICD-10-CM | POA: Diagnosis present

## 2019-04-18 DIAGNOSIS — I10 Essential (primary) hypertension: Secondary | ICD-10-CM | POA: Diagnosis present

## 2019-04-18 DIAGNOSIS — R05 Cough: Secondary | ICD-10-CM | POA: Diagnosis not present

## 2019-04-18 DIAGNOSIS — F329 Major depressive disorder, single episode, unspecified: Secondary | ICD-10-CM | POA: Diagnosis present

## 2019-04-18 DIAGNOSIS — R1013 Epigastric pain: Secondary | ICD-10-CM | POA: Diagnosis not present

## 2019-04-18 DIAGNOSIS — R059 Cough, unspecified: Secondary | ICD-10-CM

## 2019-04-18 DIAGNOSIS — K269 Duodenal ulcer, unspecified as acute or chronic, without hemorrhage or perforation: Secondary | ICD-10-CM | POA: Diagnosis not present

## 2019-04-18 MED ORDER — MONTELUKAST SODIUM 10 MG PO TABS: 10.0000 mg | ORAL_TABLET | Freq: Every day | ORAL | Status: DC

## 2019-04-18 MED ORDER — ACETAMINOPHEN 325 MG PO TABS
650.0000 mg | ORAL_TABLET | ORAL | Status: DC | PRN
  Administered 2019-04-21 – 2019-04-22 (×2): 650 mg via ORAL
  Filled 2019-04-18 (×2): qty 2

## 2019-04-18 MED ORDER — FUROSEMIDE 40 MG PO TABS: 40.0000 mg | ORAL_TABLET | Freq: Every day | ORAL | Status: DC

## 2019-04-18 MED ORDER — IPRATROPIUM-ALBUTEROL 0.5-2.5 (3) MG/3ML IN SOLN: 3.0000 mL | Freq: Four times a day (QID) | RESPIRATORY_TRACT | Status: DC | PRN

## 2019-04-18 MED ORDER — PRAVASTATIN SODIUM 20 MG PO TABS: 20.0000 mg | ORAL_TABLET | Freq: Every day | ORAL | Status: DC

## 2019-04-18 MED ORDER — METOPROLOL TARTRATE 25 MG PO TABS: 12.5000 mg | ORAL_TABLET | Freq: Two times a day (BID) | ORAL | Status: DC

## 2019-04-18 MED ORDER — NITROGLYCERIN 0.4 MG SL SUBL: 0.4000 mg | SUBLINGUAL_TABLET | SUBLINGUAL | Status: DC | PRN

## 2019-04-18 MED ORDER — BUDESONIDE 0.5 MG/2ML IN SUSP
0.5000 mg | Freq: Every day | RESPIRATORY_TRACT | Status: DC
  Administered 2019-04-19 – 2019-04-22 (×4): 0.5 mg via RESPIRATORY_TRACT
  Filled 2019-04-18 (×4): qty 2

## 2019-04-18 MED ORDER — ALBUTEROL SULFATE HFA 108 (90 BASE) MCG/ACT IN AERS: 2.0000 | INHALATION_SPRAY | Freq: Four times a day (QID) | RESPIRATORY_TRACT | Status: DC | PRN

## 2019-04-18 MED ORDER — SERTRALINE HCL 25 MG PO TABS
25.0000 mg | ORAL_TABLET | Freq: Every day | ORAL | Status: DC
  Administered 2019-04-19 – 2019-04-22 (×4): 25 mg via ORAL
  Filled 2019-04-18 (×4): qty 1

## 2019-04-18 MED ORDER — CAPTOPRIL 25 MG PO TABS
50.0000 mg | ORAL_TABLET | Freq: Three times a day (TID) | ORAL | Status: DC
  Administered 2019-04-19: 50 mg via ORAL
  Filled 2019-04-18 (×2): qty 2

## 2019-04-19 ENCOUNTER — Other Ambulatory Visit: Payer: Self-pay

## 2019-04-19 ENCOUNTER — Inpatient Hospital Stay (HOSPITAL_COMMUNITY): Payer: PPO

## 2019-04-19 ENCOUNTER — Encounter (HOSPITAL_COMMUNITY): Payer: Self-pay

## 2019-04-19 DIAGNOSIS — R627 Adult failure to thrive: Secondary | ICD-10-CM | POA: Diagnosis present

## 2019-04-19 DIAGNOSIS — Z882 Allergy status to sulfonamides status: Secondary | ICD-10-CM | POA: Diagnosis not present

## 2019-04-19 DIAGNOSIS — D509 Iron deficiency anemia, unspecified: Secondary | ICD-10-CM | POA: Diagnosis present

## 2019-04-19 DIAGNOSIS — I5022 Chronic systolic (congestive) heart failure: Secondary | ICD-10-CM

## 2019-04-19 DIAGNOSIS — F329 Major depressive disorder, single episode, unspecified: Secondary | ICD-10-CM | POA: Diagnosis present

## 2019-04-19 DIAGNOSIS — Z8249 Family history of ischemic heart disease and other diseases of the circulatory system: Secondary | ICD-10-CM | POA: Diagnosis not present

## 2019-04-19 DIAGNOSIS — K2981 Duodenitis with bleeding: Secondary | ICD-10-CM | POA: Diagnosis present

## 2019-04-19 DIAGNOSIS — Z7951 Long term (current) use of inhaled steroids: Secondary | ICD-10-CM | POA: Diagnosis not present

## 2019-04-19 DIAGNOSIS — I482 Chronic atrial fibrillation, unspecified: Secondary | ICD-10-CM | POA: Diagnosis present

## 2019-04-19 DIAGNOSIS — J42 Unspecified chronic bronchitis: Secondary | ICD-10-CM

## 2019-04-19 DIAGNOSIS — J449 Chronic obstructive pulmonary disease, unspecified: Secondary | ICD-10-CM | POA: Diagnosis present

## 2019-04-19 DIAGNOSIS — Z6833 Body mass index (BMI) 33.0-33.9, adult: Secondary | ICD-10-CM | POA: Diagnosis not present

## 2019-04-19 DIAGNOSIS — Z955 Presence of coronary angioplasty implant and graft: Secondary | ICD-10-CM | POA: Diagnosis not present

## 2019-04-19 DIAGNOSIS — I110 Hypertensive heart disease with heart failure: Secondary | ICD-10-CM | POA: Diagnosis present

## 2019-04-19 DIAGNOSIS — I2510 Atherosclerotic heart disease of native coronary artery without angina pectoris: Secondary | ICD-10-CM | POA: Diagnosis present

## 2019-04-19 DIAGNOSIS — K250 Acute gastric ulcer with hemorrhage: Secondary | ICD-10-CM | POA: Diagnosis present

## 2019-04-19 DIAGNOSIS — Z8673 Personal history of transient ischemic attack (TIA), and cerebral infarction without residual deficits: Secondary | ICD-10-CM | POA: Diagnosis not present

## 2019-04-19 DIAGNOSIS — Z7901 Long term (current) use of anticoagulants: Secondary | ICD-10-CM | POA: Diagnosis not present

## 2019-04-19 DIAGNOSIS — Z20828 Contact with and (suspected) exposure to other viral communicable diseases: Secondary | ICD-10-CM | POA: Diagnosis present

## 2019-04-19 DIAGNOSIS — Z87891 Personal history of nicotine dependence: Secondary | ICD-10-CM | POA: Diagnosis not present

## 2019-04-19 DIAGNOSIS — E039 Hypothyroidism, unspecified: Secondary | ICD-10-CM | POA: Diagnosis present

## 2019-04-19 DIAGNOSIS — F419 Anxiety disorder, unspecified: Secondary | ICD-10-CM | POA: Diagnosis present

## 2019-04-19 DIAGNOSIS — K264 Chronic or unspecified duodenal ulcer with hemorrhage: Secondary | ICD-10-CM | POA: Diagnosis present

## 2019-04-19 DIAGNOSIS — K922 Gastrointestinal hemorrhage, unspecified: Secondary | ICD-10-CM | POA: Diagnosis not present

## 2019-04-19 DIAGNOSIS — Z79899 Other long term (current) drug therapy: Secondary | ICD-10-CM | POA: Diagnosis not present

## 2019-04-19 DIAGNOSIS — E785 Hyperlipidemia, unspecified: Secondary | ICD-10-CM | POA: Diagnosis present

## 2019-04-19 LAB — ABO/RH: ABO/RH(D): O POS

## 2019-04-19 LAB — BASIC METABOLIC PANEL
Anion gap: 12 (ref 5–15)
BUN: 30 mg/dL — ABNORMAL HIGH (ref 8–23)
CO2: 27 mmol/L (ref 22–32)
Calcium: 9.4 mg/dL (ref 8.9–10.3)
Chloride: 103 mmol/L (ref 98–111)
Creatinine, Ser: 0.93 mg/dL (ref 0.44–1.00)
GFR calc Af Amer: >60 mL/min (ref >60)
GFR calc non Af Amer: 59 mL/min — ABNORMAL LOW (ref >60)
Glucose, Bld: 107 mg/dL — ABNORMAL HIGH (ref 70–99)
Potassium: 3.8 mmol/L (ref 3.5–5.1)
Sodium: 142 mmol/L (ref 135–145)

## 2019-04-19 LAB — CBC
HCT: 31 % — ABNORMAL LOW (ref 36.0–46.0)
Hemoglobin: 9.9 g/dL — ABNORMAL LOW (ref 12.0–15.0)
MCH: 32.2 pg (ref 26.0–34.0)
MCHC: 31.9 g/dL (ref 30.0–36.0)
MCV: 101 fL — ABNORMAL HIGH (ref 80.0–100.0)
Platelets: 568 10*3/uL — ABNORMAL HIGH (ref 150–400)
RBC: 3.07 MIL/uL — ABNORMAL LOW (ref 3.87–5.11)
RDW: 15.3 % (ref 11.5–15.5)
WBC: 10.8 10*3/uL — ABNORMAL HIGH (ref 4.0–10.5)
nRBC: 0 % (ref 0.0–0.2)

## 2019-04-19 LAB — GLUCOSE, CAPILLARY
Glucose-Capillary: 103 mg/dL — ABNORMAL HIGH (ref 70–99)
Glucose-Capillary: 103 mg/dL — ABNORMAL HIGH (ref 70–99)
Glucose-Capillary: 111 mg/dL — ABNORMAL HIGH (ref 70–99)
Glucose-Capillary: 120 mg/dL — ABNORMAL HIGH (ref 70–99)
Glucose-Capillary: 91 mg/dL (ref 70–99)
Glucose-Capillary: 98 mg/dL (ref 70–99)

## 2019-04-19 LAB — HEMOGLOBIN AND HEMATOCRIT, BLOOD
HCT: 31 % — ABNORMAL LOW (ref 36.0–46.0)
HCT: 31.8 % — ABNORMAL LOW (ref 36.0–46.0)
HCT: 31.8 % — ABNORMAL LOW (ref 36.0–46.0)
Hemoglobin: 9.9 g/dL — ABNORMAL LOW (ref 12.0–15.0)
Hemoglobin: 10.2 g/dL — ABNORMAL LOW (ref 12.0–15.0)
Hemoglobin: 10.3 g/dL — ABNORMAL LOW (ref 12.0–15.0)

## 2019-04-19 LAB — TYPE AND SCREEN
ABO/RH(D): O POS
Antibody Screen: NEGATIVE

## 2019-04-19 LAB — BRAIN NATRIURETIC PEPTIDE: B Natriuretic Peptide: 544.3 pg/mL — ABNORMAL HIGH (ref 0.0–100.0)

## 2019-04-19 LAB — HEMOGLOBIN A1C
Hgb A1c MFr Bld: 5.5 % (ref 4.8–5.6)
Mean Plasma Glucose: 111.15 mg/dL

## 2019-04-19 LAB — PROTIME-INR
INR: 1.4 — ABNORMAL HIGH (ref 0.8–1.2)
Prothrombin Time: 16.8 s — ABNORMAL HIGH (ref 11.4–15.2)

## 2019-04-19 LAB — ECHOCARDIOGRAM COMPLETE
Height: 64 in
Weight: 3086.44 [oz_av]

## 2019-04-19 MED ORDER — INSULIN ASPART 100 UNIT/ML ~~LOC~~ SOLN: 0.0000 [IU] | Freq: Three times a day (TID) | SUBCUTANEOUS | Status: DC

## 2019-04-19 MED ORDER — SODIUM CHLORIDE 0.9 % IV SOLN: 250.0000 mL | INTRAVENOUS | Status: DC | PRN

## 2019-04-19 MED ORDER — DOCUSATE SODIUM 100 MG PO CAPS: 100.0000 mg | ORAL_CAPSULE | Freq: Two times a day (BID) | ORAL | Status: DC

## 2019-04-19 MED ORDER — INSULIN ASPART 100 UNIT/ML ~~LOC~~ SOLN: 0.0000 [IU] | SUBCUTANEOUS | Status: DC

## 2019-04-19 MED ORDER — MORPHINE SULFATE (PF) 2 MG/ML IV SOLN
1.0000 mg | INTRAVENOUS | Status: DC | PRN
  Administered 2019-04-19 (×2): 1 mg via INTRAVENOUS
  Filled 2019-04-19 (×2): qty 1

## 2019-04-19 MED ORDER — TRAMADOL HCL 50 MG PO TABS
50.0000 mg | ORAL_TABLET | Freq: Four times a day (QID) | ORAL | Status: DC | PRN
  Administered 2019-04-19 – 2019-04-21 (×2): 50 mg via ORAL
  Filled 2019-04-19 (×2): qty 1

## 2019-04-19 MED ORDER — SODIUM CHLORIDE 0.9% FLUSH: 3.0000 mL | INTRAVENOUS | Status: DC | PRN

## 2019-04-19 MED ORDER — GABAPENTIN 300 MG PO CAPS: 300.0000 mg | ORAL_CAPSULE | Freq: Three times a day (TID) | ORAL | Status: DC

## 2019-04-19 MED ORDER — FUROSEMIDE 10 MG/ML IJ SOLN
40.0000 mg | Freq: Three times a day (TID) | INTRAMUSCULAR | Status: DC
  Administered 2019-04-19 (×2): 40 mg via INTRAVENOUS
  Filled 2019-04-19 (×2): qty 4

## 2019-04-19 MED ORDER — SODIUM CHLORIDE 0.9% FLUSH
3.0000 mL | Freq: Two times a day (BID) | INTRAVENOUS | Status: DC
  Administered 2019-04-19 – 2019-04-22 (×8): 3 mL via INTRAVENOUS

## 2019-04-19 MED ORDER — ONDANSETRON HCL 4 MG/2ML IJ SOLN
4.0000 mg | Freq: Four times a day (QID) | INTRAMUSCULAR | Status: DC | PRN
  Administered 2019-04-19: 4 mg via INTRAVENOUS
  Filled 2019-04-19: qty 2

## 2019-04-19 MED ORDER — PANTOPRAZOLE SODIUM 40 MG IV SOLR
40.0000 mg | Freq: Two times a day (BID) | INTRAVENOUS | Status: DC
  Administered 2019-04-19 – 2019-04-22 (×7): 40 mg via INTRAVENOUS
  Filled 2019-04-19 (×7): qty 40

## 2019-04-19 MED ORDER — METOPROLOL TARTRATE 5 MG/5ML IV SOLN
5.0000 mg | Freq: Three times a day (TID) | INTRAVENOUS | Status: DC
  Administered 2019-04-19 – 2019-04-20 (×3): 5 mg via INTRAVENOUS
  Filled 2019-04-19 (×3): qty 5

## 2019-04-19 NOTE — Consult Note (Signed)
UNASSIGNED PATIENT Reason for Consult: Anemia with rectal bleeding. Referring Physician: THP  Kaitlyn Underwood is an 78 y.o. female.  HPI: [name] is a 78 year old white female, multiple medical problems listed below, who was transferred to [facility] from [facility] after she presented there with chest pain and abdominal pain and history of rectal bleeding 2 days prior to admission.  She was found to have a drop in her hemoglobin to 10.6 g/dL deciliter from 13.3 g/dL and was FOBT positive CT scan of the abdomen pelvis done at [facility] revealed an absent right kidney and mild stranding in the duodenal area consistent consistent with duodenitis. She was transferred to [facility] for further GI evaluation.  Patient gives a history of having right upper quadrant abdominal pain but she has had shingles there in the recent past.  She denies having any abdominal pain, nausea, vomiting, dysphagia or diet aphasia at this time. She has had a colonoscopy done by Dr. Lyda Jester several years ago when a small polyp was removed but those records are not available to [place] for review at this time.  She has been anticoagulated for atrial fibrillation and her Eliquis is is presently on hold.  Past Medical History:  Diagnosis Date  . Anxiety   . Atrial fibrillation ([place])   . Chronic systolic heart failure ([place])   . COPD (chronic obstructive pulmonary disease) ([place])   . Coronary artery disease   . Hypertension   . Hypothyroidism   . Left renal mass    unconfirmed documentation of "kidney cancer", possibly cyst  . Pre-diabetes   . Stroke [facility])    Past Surgical History:  Procedure Laterality Date  . IR CT HEAD LTD  07/06/2018  . IR PERCUTANEOUS ART THROMBECTOMY/INFUSION INTRACRANIAL INC DIAG ANGIO  07/06/2018  . LEFT HEART CATH AND CORONARY ANGIOGRAPHY N/A 04/29/2017   Procedure: LEFT HEART CATH AND CORONARY ANGIOGRAPHY;  Surgeon: Belva Crome, MD;  Location: [place] [facility];  Service:  Cardiovascular;  Laterality: N/A;  . RADIOLOGY WITH ANESTHESIA N/A 07/05/2018   Procedure: RADIOLOGY WITH ANESTHESIA;  Surgeon: Luanne Bras, MD;  Location: [place];  Service: Radiology;  Laterality: N/A;  . ULTRASOUND GUIDANCE FOR VASCULAR ACCESS  04/29/2017   Procedure: Ultrasound Guidance For Vascular Access;  Surgeon: Belva Crome, MD;  Location: [place] [facility];  Service: Cardiovascular;;   Family History  Problem Relation Age of Onset  . Arrhythmia Sister   . Heart disease Brother   . Heart attack Maternal Uncle     Social History:  reports that she quit smoking about 28 years ago. Her smoking use included cigarettes. She has a 20.00 pack-year smoking history. She has never used smokeless tobacco. She reports that she does not drink alcohol or use drugs.  Allergies:  Allergies  Allergen Reactions  . Sulfa Antibiotics Anaphylaxis    tongue   Medications: I have reviewed the patient's current medications.  Results for orders placed or performed during the hospital encounter of 04/18/19 (from the past 48 hour(s))  Glucose, capillary     Status: None   Collection Time: 04/19/19  1:51 AM  Result Value Ref Range   Glucose-Capillary 91 70 - 99 mg/dL  Hemoglobin A1c     Status: None   Collection Time: 04/19/19  4:27 AM  Result Value Ref Range   Hgb A1c MFr Bld 5.5 4.8 - 5.6 %    Comment: (NOTE) Pre diabetes:  5.7%-6.4% Diabetes:              >6.4% Glycemic control for   <7.0% adults with diabetes    Mean Plasma Glucose 111.15 mg/dL    Comment: Performed at [facility] [address], [place], [place] [address]  Glucose, capillary     Status: Abnormal   Collection Time: 04/19/19  8:00 AM  Result Value Ref Range   Glucose-Capillary 111 (H) 70 - 99 mg/dL  Basic metabolic panel     Status: Abnormal   Collection Time: 04/19/19  8:25 AM  Result Value Ref Range   Sodium 142 135 - 145 mmol/L   Potassium 3.8 3.5 - 5.1 mmol/L   Chloride 103 98 - 111 mmol/L    CO2 27 22 - 32 mmol/L   Glucose, Bld 107 (H) 70 - 99 mg/dL   BUN 30 (H) 8 - 23 mg/dL   Creatinine, Ser 0.93 0.44 - 1.00 mg/dL   Calcium 9.4 8.9 - 10.3 mg/dL   GFR calc non Af Amer 59 (L) >60 mL/min   GFR calc Af Amer >60 >60 mL/min   Anion gap 12 5 - 15    Comment: Performed at [facility], [place] [address], [place], [place] [address]  CBC     Status: Abnormal   Collection Time: 04/19/19  8:25 AM  Result Value Ref Range   WBC 10.8 (H) 4.0 - 10.5 K/uL   RBC 3.07 (L) 3.87 - 5.11 MIL/uL   Hemoglobin 9.9 (L) 12.0 - 15.0 g/dL   HCT 31.0 (L) 36.0 - 46.0 %   MCV 101.0 (H) 80.0 - 100.0 fL   MCH 32.2 26.0 - 34.0 pg   MCHC 31.9 30.0 - 36.0 g/dL   RDW 15.3 11.5 - 15.5 %   Platelets 568 (H) 150 - 400 K/uL   nRBC 0.0 0.0 - 0.2 %    Comment: Performed at [facility], [place] [address] [place], [place] [address]  Brain natriuretic peptide     Status: Abnormal   Collection Time: 04/19/19  8:25 AM  Result Value Ref Range   B Natriuretic Peptide 544.3 (H) 0.0 - 100.0 pg/mL    Comment: Performed at [facility], [place] [address] [place], [place] [address]  Glucose, capillary     Status: Abnormal   Collection Time: 04/19/19 11:51 AM  Result Value Ref Range   Glucose-Capillary 120 (H) 70 - 99 mg/dL  Hemoglobin and hematocrit, blood     Status: Abnormal   Collection Time: 04/19/19 12:44 PM  Result Value Ref Range   Hemoglobin 9.9 (L) 12.0 - 15.0 g/dL   HCT 31.0 (L) 36.0 - 46.0 %    Comment: Performed at [facility], [place] [address] [place], [place] [address]  Type and screen [place]     Status: None   Collection Time: 04/19/19 12:44 PM  Result Value Ref Range   ABO/RH(D) O POS    Antibody Screen NEG    Sample Expiration      04/22/2019,2359 Performed at [facility], [place] [address] [place], [place] [address]   ABO/Rh     Status: None (Preliminary result)   Collection Time:  04/19/19 12:44 PM  Result Value Ref Range   ABO/RH(D)      O POS Performed at [facility], [place] [address] [place], [place] [address]   Protime-INR     Status: Abnormal  Collection Time: 04/19/19  2:46 PM  Result Value Ref Range   Prothrombin Time 16.8 (H) 11.4 - 15.2 seconds   INR 1.4 (H) 0.8 - 1.2    Comment: (NOTE) INR goal varies based on device and disease states. Performed at [facility], [place] [address] [place], [place] [address]   Review of Systems  Constitutional: Positive for malaise/fatigue. Negative for chills, diaphoresis and weight loss.  HENT: Negative.   Eyes: Negative.   Respiratory: Positive for shortness of breath.   Cardiovascular: Positive for chest pain. Negative for palpitations, orthopnea, claudication, leg swelling and PND.  Gastrointestinal: Positive for abdominal pain, blood in stool and constipation. Negative for diarrhea, heartburn, nausea and vomiting.  Genitourinary: Negative.   Musculoskeletal: Positive for back pain and joint pain.  Skin: Positive for rash.  Neurological: Positive for focal weakness.  Endo/Heme/Allergies: Negative.   Psychiatric/Behavioral: Positive for memory loss. The patient is nervous/anxious.    Blood pressure 100/70, pulse 91, temperature 98.2 F (36.8 C), temperature source Oral, resp. rate 15, height 5\' 4"  (1.626 m), weight 87.5 kg, SpO2 94 %. Physical Exam  Constitutional: She appears well-developed and well-nourished.  HENT:  Head: Normocephalic and atraumatic.  Eyes: Pupils are equal, round, and reactive to light. Conjunctivae and EOM are normal.  Neck: Normal range of motion. Neck supple.  Cardiovascular: An irregularly irregular rhythm present.  Respiratory: Effort normal and breath sounds normal.  GI: Soft. Normal appearance and bowel sounds are normal. She exhibits no distension and no mass. There is no abdominal tenderness. There is no rebound and no guarding.    Assessment/Plan: 1) Anemia-drop in hemoglobin, with rectal bleeding and abnormal CT scan-an EGD and a colonoscopy is planned for the patient tomorrow-she is restarted on clear liquids.  If she tolerates the clear liquids well and is able to do the prep will proceed with the procedures tomorrow afternoon.I have discussed this with both the patient and her daughter who is at bedside.  2) Personal history of a colonic polyp removed in the past-patient states this is over 10 years ago. 3) Chronic systolic heart failure with EF of 25 to 35%. 4) Hpertension/Hyperlipidemia. 5) Shingles on the abdomen right upper quadrant. 6) Depression on Zoloft. Juanita Craver 04/19/2019, 3:57 PM

## 2019-04-19 NOTE — Progress Notes (Signed)
  Echocardiogram 2D Echocardiogram has been performed.  Kaitlyn Underwood 04/19/2019, 2:01 PM

## 2019-04-19 NOTE — H&P (Addendum)
History and Physical    [name] [id number] DOB: 02-18-1941 DOA: 04/18/2019  PCP: Lowella Dandy, NP (Confirm with patient/family/NH records and if not entered, this has to be entered at [facility] point of entry) Patient coming from: transfrer from [facility], she had presented from home  I have personally briefly reviewed patient's old medical records in [place]  Chief Complaint: abdominal and chest pain.  HPI: Kaitlyn Underwood is a 78 y.o. female with medical history significant of CVA, DM, a fib on Eliquis, HTN, HLD who presented with c/o abdominaland chest pain. Pain described as sharp on the left chest with radiation to the abdomen. She reports associated SOB. Pain is intermittently recurrent. She did report BRBPR 2 days prior to ED visit. Patient did develop a rash on the right side several days ago. She was diagnosed with shingles by her PCP and started on medications.   (For level 3, the HPI must include 4+ descriptors: Location, Quality, Severity, Duration, Timing, Context, modifying factors, associated signs/symptoms and/or status of 3+ chronic problems.)  (Please avoid self-populating past medical history here) (The initial 2-3 lines should be focused and good to copy and paste in the HPI section of the daily progress note).  ED Course: Patient was hemodynamically stable. Lab revealed Hgb 10.6 down from previous of 13.3.  Chart review did reveal previous anemia as low as 11.  BNP elevated at 5,500. CT angio was negative for PE. CT abd/pelvis - absent right kidney, mild fat stranding about the duodenum c/w duodenitis, ulcer not ruled out. CXR w/ mild cardiomegaly but NAD. Exam revealed abdominal tenderness, heme positive stool without red blood, w/o hemorrhoids. Referrerd for admission for further GI workup, monitoring of Hgb and mgt of cardiac issues with elevated BNP.  Review of Systems: As per HPI otherwise 10 point review of systems negative. Painful rash right flank. Denies N/V. Has had to  strain at stool.. Does endorse mild increase in SOB over past week.    Past Medical History:  Diagnosis Date   Anxiety    Atrial fibrillation (HCC)    Chronic systolic heart failure (HCC)    COPD (chronic obstructive pulmonary disease) (HCC)    Coronary artery disease    Hypertension    Hypothyroidism    Left renal mass    unconfirmed documentation of "kidney cancer", possibly cyst   Pre-diabetes    Stroke [facility])     Past Surgical History:  Procedure Laterality Date   IR CT HEAD LTD  07/06/2018   IR PERCUTANEOUS ART THROMBECTOMY/INFUSION INTRACRANIAL INC DIAG ANGIO  07/06/2018   LEFT HEART CATH AND CORONARY ANGIOGRAPHY N/A 04/29/2017   Procedure: LEFT HEART CATH AND CORONARY ANGIOGRAPHY;  Surgeon: Belva Crome, MD;  Location: [place] [facility];  Service: Cardiovascular;  Laterality: N/A;   RADIOLOGY WITH ANESTHESIA N/A 07/05/2018   Procedure: RADIOLOGY WITH ANESTHESIA;  Surgeon: Luanne Bras, MD;  Location: [place];  Service: Radiology;  Laterality: N/A;   ULTRASOUND GUIDANCE FOR VASCULAR ACCESS  04/29/2017   Procedure: Ultrasound Guidance For Vascular Access;  Surgeon: Belva Crome, MD;  Location: [place] [facility];  Service: Cardiovascular;;   Soc Hx - lives independently with her husband. Retired.   reports that she quit smoking about 28 years ago. Her smoking use included cigarettes. She has a 20.00 pack-year smoking history. She has never used smokeless tobacco. She reports that she does not drink alcohol or use drugs.  Allergies  Allergen Reactions   Sulfa  Antibiotics Anaphylaxis    tongue    Family History  Problem Relation Age of Onset   Arrhythmia Sister    Heart disease Brother    Heart attack Maternal Uncle      Prior to Admission medications   Medication Sig Start Date End Date Taking? Authorizing Provider  acetaminophen (TYLENOL) 325 MG tablet Take 2 tablets (650 mg total) by mouth every 4 (four) hours as needed for mild pain (or  temp > 37.5 C (99.5 F)). 07/24/18   Angiulli, Lavon Paganini, PA-C  albuterol (PROVENTIL HFA;VENTOLIN HFA) 108 (90 Base) MCG/ACT inhaler Inhale 2 puffs into the lungs every 6 (six) hours as needed for wheezing or shortness of breath. 07/24/18   Angiulli, Lavon Paganini, PA-C  apixaban (ELIQUIS) 5 MG TABS tablet Take 1 tablet (5 mg total) by mouth 2 (two) times daily. 07/24/18   Angiulli, Lavon Paganini, PA-C  budesonide (PULMICORT) 0.5 MG/2ML nebulizer solution Take 0.5 mg by nebulization daily.     [provider]  captopril (CAPOTEN) 50 MG tablet TAKE 1 TABLET BY MOUTH THREE TIMES DAILY Patient taking differently: Take 50 mg by mouth 3 (three) times daily.  06/23/18   Revankar, Reita Cliche, MD  digoxin (LANOXIN) 0.125 MG tablet Take 1 tablet (125 mcg total) by mouth daily. Patient not taking: Reported on 10/22/2018 07/24/18   Angiulli, Lavon Paganini, PA-C  furosemide (LASIX) 40 MG tablet Take 40 mg by mouth daily.    [provider]  ipratropium-albuterol (DUONEB) 0.5-2.5 (3) MG/3ML SOLN Take 3 mLs by nebulization every 6 (six) hours as needed (shortness of breath/wheezing.).  05/13/17   [provider]  metoprolol tartrate (LOPRESSOR) 25 MG tablet Take 0.5 tablets (12.5 mg total) by mouth 2 (two) times daily. 07/24/18   Angiulli, Lavon Paganini, PA-C  montelukast (SINGULAIR) 10 MG tablet Take 1 tablet (10 mg total) by mouth daily. 07/24/18   Angiulli, Lavon Paganini, PA-C  nitroGLYCERIN (NITROSTAT) 0.4 MG SL tablet Place 0.4 mg under the tongue every 5 (five) minutes as needed for chest pain.  11/16/15   [provider]  pravastatin (PRAVACHOL) 20 MG tablet Take 1 tablet (20 mg total) by mouth daily at 6 PM. 07/24/18   Angiulli, Lavon Paganini, PA-C  sertraline (ZOLOFT) 25 MG tablet Take 1 tablet (25 mg total) by mouth daily. 07/24/18   Angiulli, Lavon Paganini, PA-C    Physical Exam: There were no vitals filed for this visit. Last vitals at [facility]: 97/7,  HR 82, R 20, 113/79 O2 sat 92 Constitutional: NAD, calm,  comfortable There were no vitals filed for this visit. Eyes: PERRL, lids and conjunctivae normal ENMT: Mucous membranes are moist. Posterior pharynx clear of any exudate or lesions.  Neck: normal, supple, no masses, no thyromegaly Respiratory: decrreased breathsounds. Crackles at right base. Normal respiratory effort. No accessory muscle use.  Cardiovascular: IRIR,  Soft systolic murmur apex/axillary line /no rubs /no gallops. No extremity edema. 2+ pedal pulses. No carotid bruits.  Abdomen: obese, tender to deep palpation at epigastrum and RUG. No hepatosplenomegaly. hypoactive Bowel sounds.  Musculoskeletal: no clubbing / cyanosis. No joint deformity upper and lower extremities. Good ROM, no contractures. Normal muscle tone.  Skin: erythematous macular rashe at low right backwith radiation around to abdomen with areas of desquamation, no lesions, ulcers. No induration Neurologic: CN 2-12 grossly intact but mild lid lag right.difficulty with word finding but speech is clear.. Sensation intact. Strength 4/5 in all 4.  Psychiatric: Normal judgment and insight. Alert and oriented x  3. Normal mood.   (Anything < 9 systems with 2 bullets each down codes to level 1) (If patient refuses exam cant bill higher level) (Make sure to document decubitus ulcers present on admission -- if possible -- and whether patient has chronic indwelling catheter at time of admission)  Labs on Admission: I have personally reviewed following labs and imaging studies Labs from [facility]: WBC 10.3, Hgb 10.6, Hct 30.1, Plt 618, diff 72/8/18.5/5.5, Na 139, K 4.1, Cl 106, CO2 27, Cr 0.8, est GFR >60, glu 112, LFTs nl, ProBNP 5500, albumin 3.5  CT angio - negaitve for PE, 65mm nodule LUL; CT abd/pelvis - absent right kidney, stranding at duodenum otherwise nl, CXR, mild cardiomegaly, NASD CBC: No results for input(s): WBC, NEUTROABS, HGB, HCT, MCV, PLT in the last 168 hours. Basic Metabolic Panel: No results for input(s): NA, K,  CL, CO2, GLUCOSE, BUN, CREATININE, CALCIUM, MG, PHOS in the last 168 hours. GFR: CrCl cannot be calculated (Patient's most recent lab result is older than the maximum 21 days allowed.). Liver Function Tests: No results for input(s): AST, ALT, ALKPHOS, BILITOT, PROT, ALBUMIN in the last 168 hours. No results for input(s): LIPASE, AMYLASE in the last 168 hours. No results for input(s): AMMONIA in the last 168 hours. Coagulation Profile: No results for input(s): INR, PROTIME in the last 168 hours. Cardiac Enzymes: No results for input(s): CKTOTAL, CKMB, CKMBINDEX, TROPONINI in the last 168 hours. BNP (last 3 results) No results for input(s): PROBNP in the last 8760 hours. HbA1C: No results for input(s): HGBA1C in the last 72 hours. CBG: No results for input(s): GLUCAP in the last 168 hours. Lipid Profile: No results for input(s): CHOL, HDL, LDLCALC, TRIG, CHOLHDL, LDLDIRECT in the last 72 hours. Thyroid Function Tests: No results for input(s): TSH, T4TOTAL, FREET4, T3FREE, THYROIDAB in the last 72 hours. Anemia Panel: No results for input(s): VITAMINB12, FOLATE, FERRITIN, TIBC, IRON, RETICCTPCT in the last 72 hours. Urine analysis:    Component Value Date/Time   COLORURINE YELLOW 07/22/2018 1240   APPEARANCEUR CLOUDY (A) 07/22/2018 1240   LABSPEC 1.014 07/22/2018 1240   PHURINE 8.0 07/22/2018 1240   GLUCOSEU NEGATIVE 07/22/2018 1240   HGBUR NEGATIVE 07/22/2018 1240   BILIRUBINUR NEGATIVE 07/22/2018 1240   KETONESUR NEGATIVE 07/22/2018 1240   PROTEINUR NEGATIVE 07/22/2018 1240   NITRITE NEGATIVE 07/22/2018 1240   LEUKOCYTESUR TRACE (A) 07/22/2018 1240    Radiological Exams on Admission: No results found.  EKG: Independently reviewed. A flutter PVCs  Assessment/Plan Active Problems:   Duodenitis with hemorrhage   Chronic atrial fibrillation   Chronic systolic heart failure (HCC)   Diabetes mellitus due to underlying condition with unspecified complications ([place])    Essential hypertension   COPD (chronic obstructive pulmonary disease) ([place])  (please populate well all problems here in Problem List. (For example, if patient is on BP meds at home and you resume or decide to hold them, it is a problem that needs to be her. Same for CAD, COPD, HLD and so on)   1. Gi- CT evidence of duodenitis or duodenal ulcer, heme positive stools. Suspect BRBPR was due to strain and internal hemmorhoids. Being on NOAC exacerbating factor. Plan Hold eliquis - no brisk bleed evidence to require reversal  IV PPI full dose  GI consult later today - may need endoscopy. Is NPO except sip/chips/meds  F/u CBC  2. CHF - elevated BNP, mild crackles right base. Last echo 07/07/18 LVEF 25-35%%, diffuse hypokinesis. Plan Lasix 40 mg q8 x  3 doses  F/u 2 view CXR  Continue home meds  2 D echo  3. DM- patient med list does not include DM meds. Last A1C NOv '19 5.6% Plan A1C  CBG monitoring  4. COPD - stable. Continue home meds. May have nebs once Covid test returns.   5. Shingles - patient has completed steripred dosepak. Plan   gabapentin 300 mg tid for pain.   DVT prophylaxis: SCD (Lovenox/Heparin/SCD's/anticoagulated/None (if comfort care) Code Status: full code (Full/Partial (specify details) Family Communication: patient asked I not call husband at late hour (Specify name, relationship. Do not write "discussed with patient". Specify tel # if discussed over the phone) Disposition Plan: home in 2-4 days  (specify when and where you expect patient to be discharged) Consults called: GI - will need to call in consult ;ater today (with names) Admission status: telemetry (inpatient / obs / tele / medical floor / SDU)   Adella Hare MD [facility] Pager [phone]  If 7PM-7AM, please contact night-coverage [email or web address] Password [facility]  04/19/2019, 12:15 AM

## 2019-04-19 NOTE — Plan of Care (Signed)

## 2019-04-19 NOTE — Progress Notes (Signed)
MEWS/VS Documentation      04/19/2019 0706 04/19/2019 0714 04/19/2019 0826 04/19/2019 1131   MEWS Score:  2  3  -  2   MEWS Score Color:  Yellow  Yellow  -  Yellow   Resp:  -  -  -  20   Pulse:  (!) 101  -  -  (!) 125   BP:  100/79  -  -  132/78   Temp:  -  -  -  98.4 F (36.9 C)   O2 Device:  -  -  Room [employer]     Paged Dr. Maylene Roes to inform her that patient was having an active GI Bleed. Patient had 2 massive black tarry bowel movements and vomited clear greenish emesis. I informed her that her heart rate had gotten up into the 160's per central telemetry while she was up on the bedside commode. She also complained of feeling dizzy while she was sitting up. Dr. Maylene Roes put in some orders for pain meds, nausea medication and some metoprolol IV for her heart rate. Waiting on GI to see patient.

## 2019-04-19 NOTE — Progress Notes (Signed)
  PROGRESS NOTE  Patient admitted earlier this morning. See H&P. Kaitlyn Underwood is a 78 y.o. female with medical history significant of CVA, DM, A fib on Eliquis, HTN, HLD who presented with complaints of abdominaland chest pain. Pain described as sharp on the left chest with radiation to the abdomen. She reports associated SOB. Pain is intermittently recurrent. She did report BRBPR 2 days prior to ED visit. Patient did develop a rash on the right side several days ago. She was diagnosed with shingles by her PCP and started on medications, shingles now resolved and scabbed over.   In the [facility] emergency department, labs revealed hemoglobin of 10.6 (previous hemoglobin 13.3), FOBT positive.  CT abdomen pelvis revealed absent right kidney, mild fat stranding about the duodenum consistent with duodenitis, ulcer not ruled out.  Patient was transferred from [facility] ED to [facility] hospital for further GI evaluation.  On examination, her main complaint is RUQ abdominal pain, although this is the site of her previous shingles. Shingles rash on exam is no longer active, now scabbed over and healing. Cardiac exam is irreg rhythm, rate 110s.  Respiratory exam, on room air without acute respiratory distress.  She has no peripheral edema bilateral lower extremities.  She states that she has had a colonoscopy in the last 5 years, states that it was abnormal but unable to tell me any specific results.  Her GI physician is in [place], results not available on epic.  She denies any use of over-the-counter ibuprofen/NSAID.    GI bleed -Hemoglobin baseline 12.5 in Dec 2019 per our records. Presented to [facility] with Hgb 10.6, now 9.9 this morning -FOBT positive per report from [place]. Also patient complained of BRBPR 2 days ago as well -Hold Eliquis  -GI consulted today   Chronic systolic heart failure -Previous echocardiogram 07/07/2018 revealed EF 25 to 35% -BNP 544. Does not appear to be fluid overloaded on  examination -Patient received IV Lasix at time of admission.  Hold further Lasix dosing -Repeat echocardiogram pending  Chronic atrial fibrillation -Hold Eliquis, Lopressor due to marginal blood pressure 100/79  Essential hypertension -Hold captopril due to marginal blood pressure 100/79  Hyperlipidemia -Hold Pravachol while n.p.o.  Recovered shingles -Continues to have neuropathic pain at the site of healing shingles outbreak -Gabapentin  Depression -Continue Zoloft   COVID testing pending at this time. Routine testing for admission, patient without COVID symptoms on admission.    Dessa Phi, DO [facility] [email or web address] 04/19/2019, 10:36 AM

## 2019-04-20 ENCOUNTER — Inpatient Hospital Stay (HOSPITAL_COMMUNITY): Payer: PPO | Admitting: Registered Nurse

## 2019-04-20 ENCOUNTER — Encounter (HOSPITAL_COMMUNITY)
Admission: AD | Disposition: A | Discharge status: Home / Self Care | Payer: Self-pay | Source: Other Acute Inpatient Hospital | Attending: Internal Medicine

## 2019-04-20 ENCOUNTER — Encounter (HOSPITAL_COMMUNITY): Payer: Self-pay

## 2019-04-20 ENCOUNTER — Inpatient Hospital Stay (HOSPITAL_COMMUNITY): Payer: PPO

## 2019-04-20 HISTORY — PX: HOT HEMOSTASIS: SHX5433

## 2019-04-20 HISTORY — PX: POLYPECTOMY: SHX5525

## 2019-04-20 HISTORY — PX: COLONOSCOPY WITH PROPOFOL: SHX5780

## 2019-04-20 HISTORY — PX: BIOPSY: SHX5522

## 2019-04-20 HISTORY — PX: ESOPHAGOGASTRODUODENOSCOPY (EGD) WITH PROPOFOL: SHX5813

## 2019-04-20 LAB — POCT I-STAT EG7
Acid-Base Excess: 3 mmol/L — ABNORMAL HIGH (ref 0.0–2.0)
Bicarbonate: 28.7 mmol/L — ABNORMAL HIGH (ref 20.0–28.0)
Calcium, Ion: 1.29 mmol/L (ref 1.15–1.40)
HCT: 28 % — ABNORMAL LOW (ref 36.0–46.0)
Hemoglobin: 9.5 g/dL — ABNORMAL LOW (ref 12.0–15.0)
O2 Saturation: 58 %
Patient temperature: 61
Potassium: 4.8 mmol/L (ref 3.5–5.1)
Sodium: 139 mmol/L (ref 135–145)
TCO2: 30 mmol/L (ref 22–32)
pCO2, Ven: 19 mmHg — CL (ref 44.0–60.0)
pH, Ven: 7.697 (ref 7.250–7.430)
pO2, Ven: <15 mmHg — CL (ref 32.0–45.0)

## 2019-04-20 LAB — GLUCOSE, CAPILLARY
Glucose-Capillary: 105 mg/dL — ABNORMAL HIGH (ref 70–99)
Glucose-Capillary: 108 mg/dL — ABNORMAL HIGH (ref 70–99)
Glucose-Capillary: 109 mg/dL — ABNORMAL HIGH (ref 70–99)
Glucose-Capillary: 111 mg/dL — ABNORMAL HIGH (ref 70–99)
Glucose-Capillary: 76 mg/dL (ref 70–99)
Glucose-Capillary: 84 mg/dL (ref 70–99)

## 2019-04-20 LAB — HEMOGLOBIN AND HEMATOCRIT, BLOOD
HCT: 30.9 % — ABNORMAL LOW (ref 36.0–46.0)
HCT: 29.8 % — ABNORMAL LOW (ref 36.0–46.0)
Hemoglobin: 9.5 g/dL — ABNORMAL LOW (ref 12.0–15.0)
Hemoglobin: 9.4 g/dL — ABNORMAL LOW (ref 12.0–15.0)

## 2019-04-20 LAB — SARS CORONAVIRUS 2 (TAT 6-24 HRS): SARS Coronavirus 2: NEGATIVE

## 2019-04-20 SURGERY — CANCELLED PROCEDURE

## 2019-04-20 SURGERY — COLONOSCOPY WITH PROPOFOL
Anesthesia: Monitor Anesthesia Care

## 2019-04-20 MED ORDER — METOPROLOL TARTRATE 25 MG PO TABS
12.5000 mg | ORAL_TABLET | Freq: Two times a day (BID) | ORAL | Status: DC
  Administered 2019-04-20 – 2019-04-22 (×5): 12.5 mg via ORAL
  Filled 2019-04-20 (×5): qty 1

## 2019-04-20 MED ORDER — PROPOFOL 500 MG/50ML IV EMUL
INTRAVENOUS | Status: DC | PRN
  Administered 2019-04-20: 25 ug/kg/min via INTRAVENOUS

## 2019-04-20 MED ORDER — PEG 3350-KCL-NA BICARB-NACL 420 G PO SOLR: 4000.0000 mL | Freq: Once | ORAL | Status: DC

## 2019-04-20 MED ORDER — FUROSEMIDE 40 MG PO TABS
40.0000 mg | ORAL_TABLET | Freq: Every day | ORAL | Status: DC
  Administered 2019-04-20 – 2019-04-22 (×3): 40 mg via ORAL
  Filled 2019-04-20 (×3): qty 1

## 2019-04-20 MED ORDER — SODIUM CHLORIDE (PF) 0.9 % IJ SOLN
PREFILLED_SYRINGE | INTRAMUSCULAR | Status: DC | PRN
  Administered 2019-04-20: 17:00:00 4 mL

## 2019-04-20 MED ORDER — PEG 3350-KCL-NA BICARB-NACL 420 G PO SOLR
4000.0000 mL | Freq: Once | ORAL | Status: AC
  Administered 2019-04-20: 4000 mL via ORAL

## 2019-04-20 MED ORDER — SUCRALFATE 1 G PO TABS
1.0000 g | ORAL_TABLET | Freq: Three times a day (TID) | ORAL | Status: DC
  Administered 2019-04-20 – 2019-04-22 (×7): 1 g via ORAL
  Filled 2019-04-20 (×9): qty 1

## 2019-04-20 MED ORDER — PRAVASTATIN SODIUM 20 MG PO TABS
20.0000 mg | ORAL_TABLET | Freq: Every day | ORAL | Status: DC
  Administered 2019-04-20 – 2019-04-21 (×2): 20 mg via ORAL
  Filled 2019-04-20 (×3): qty 1

## 2019-04-20 MED ORDER — PROPOFOL 500 MG/50ML IV EMUL
INTRAVENOUS | Status: DC | PRN
  Administered 2019-04-20: 10 mg via INTRAVENOUS

## 2019-04-20 MED ORDER — SODIUM CHLORIDE 0.9 % IV SOLN
INTRAVENOUS | Status: DC
  Administered 2019-04-20: 16:00:00 via INTRAVENOUS

## 2019-04-20 MED ORDER — PROPOFOL 10 MG/ML IV BOLUS
INTRAVENOUS | Status: AC
  Filled 2019-04-20: qty 40

## 2019-04-20 MED ORDER — CAPTOPRIL 25 MG PO TABS
50.0000 mg | ORAL_TABLET | Freq: Three times a day (TID) | ORAL | Status: DC
  Administered 2019-04-20 – 2019-04-22 (×7): 50 mg via ORAL
  Filled 2019-04-20 (×8): qty 2

## 2019-04-20 SURGICAL SUPPLY — 25 items

## 2019-04-20 SURGICAL SUPPLY — 15 items

## 2019-04-20 NOTE — Anesthesia Preprocedure Evaluation (Addendum)
Anesthesia Evaluation  Patient identified by MRN, date of birth, ID band Patient awake    Reviewed: Allergy & Precautions, NPO status , Patient's Chart, lab work & pertinent test results, reviewed documented beta blocker date and time   Airway Mallampati: I  TM Distance: >3 FB Neck ROM: Full    Dental no notable dental hx. (+) Edentulous Lower, Edentulous Upper   Pulmonary COPD,  COPD inhaler, former smoker,    Pulmonary exam normal breath sounds clear to auscultation       Cardiovascular hypertension, Pt. on medications and Pt. on home beta blockers + CAD and + Cardiac Stents (2018)  Normal cardiovascular exam+ dysrhythmias (on eliquis, last dose 8/30 AM) Atrial Fibrillation  Rhythm:Regular Rate:Normal  TTE 06/2018 EF 25-30%, Diffuse hypokinesis and dyskinesis of the basal and mid inferoseptal, anteroseptal and apical septal walls, severe MR, moderate pulmonary hypertension, PA peak pressure 57 mmHg  LHC 2018 Severe diffuse coronary calcification involving left main, circumflex, and right coronary proximal to mid segments. Eccentric 50% mid RCA stenoses. Proximal and mid 50% stenoses in the LAD which is relatively small in caliber and distribution. Large first diagonal vessel supplies branches to the anterolateral wall. One branch contains 30-40% narrowing. The circumflex is large and to a large marginal which further bifurcates. The stent in the proximal vessel is widely patent. Abnormal left ventricle with anterior wall and inferoapical wall severe hypokinesis. Estimated ejection fraction 35-40% normal end-diastolic pressure.   Neuro/Psych PSYCHIATRIC DISORDERS Anxiety CVA (06/2018)    GI/Hepatic negative GI ROS, Neg liver ROS,   Endo/Other  diabetes (diet controlled)Hypothyroidism   Renal/GU negative Renal ROS  negative genitourinary   Musculoskeletal  (+) Arthritis ,   Abdominal   Peds  Hematology  (+) Blood  dyscrasia (Hgb 9.4), anemia ,   Anesthesia Other Findings BRBPR x3 days, anemia  Reproductive/Obstetrics                           Anesthesia Physical Anesthesia Plan  ASA: IV  Anesthesia Plan: MAC   Post-op Pain Management:    Induction: Intravenous  PONV Risk Score and Plan: 2 and Propofol infusion and Treatment may vary due to age or medical condition  Airway Management Planned: Natural Airway  Additional Equipment:   Intra-op Plan:   Post-operative Plan:   Informed Consent: I have reviewed the patients History and Physical, chart, labs and discussed the procedure including the risks, benefits and alternatives for the proposed anesthesia with the patient or authorized representative who has indicated his/her understanding and acceptance.     Dental advisory given  Plan Discussed with: CRNA  Anesthesia Plan Comments:         Anesthesia Quick Evaluation

## 2019-04-20 NOTE — Transfer of Care (Signed)
Immediate Anesthesia Transfer of Care Note  Patient: Kaitlyn Underwood  Procedure(s) Performed: COLONOSCOPY WITH PROPOFOL (N/A ) ESOPHAGOGASTRODUODENOSCOPY (EGD) WITH PROPOFOL (N/A ) BIOPSY HOT HEMOSTASIS (ARGON PLASMA COAGULATION/BICAP) (N/A ) POLYPECTOMY  Patient Location: PACU  Anesthesia Type:MAC  Level of Consciousness: awake, alert  and oriented  Airway & Oxygen Therapy: Patient Spontanous Breathing and Patient connected to nasal cannula oxygen  Post-op Assessment: Report given to RN and Post -op Vital signs reviewed and stable  Post vital signs: Reviewed and stable  Last Vitals:  Vitals Value Taken Time  BP 121/68 04/20/19 1700  Temp    Pulse    Resp 20 04/20/19 1702  SpO2 93 % 04/20/19 1700  Vitals shown include unvalidated device data.  Last Pain:  Vitals:   04/20/19 1700  TempSrc:   PainSc: 0-No pain      Patients Stated Pain Goal: 3 (16/10/96 0454)  Complications: No apparent anesthesia complications

## 2019-04-20 NOTE — Op Note (Signed)
[facility] Patient Name: Kaitlyn Underwood Procedure Date: 04/20/2019 MRN: [id number] Attending MD: Carol Ada , MD Date of Birth: 18-Jul-1941 CSN: [id number] Age: 78 Admit Type: Inpatient Procedure:                Upper GI endoscopy Indications:              Iron deficiency anemia, Hematochezia Providers:                Carol Ada, MD, Carlyn Reichert, RN, Marguerita Merles, Technician Referring MD:              Medicines:                Propofol per Anesthesia Complications:            No immediate complications. Estimated Blood Loss:     Estimated blood loss: none. Procedure:                Pre-Anesthesia Assessment:                           - Prior to the procedure, a History and Physical                            was performed, and patient medications and                            allergies were reviewed. The patient's tolerance of                            previous anesthesia was also reviewed. The risks                            and benefits of the procedure and the sedation                            options and risks were discussed with the patient.                            All questions were answered, and informed consent                            was obtained. Prior Anticoagulants: The patient has                            taken no previous anticoagulant or antiplatelet                            agents. ASA Grade Assessment: III - A patient with                            severe systemic disease. After reviewing the risks  and benefits, the patient was deemed in                            satisfactory condition to undergo the procedure.                           - Sedation was administered by an anesthesia                            professional. Deep sedation was attained.                           After obtaining informed consent, the endoscope was                            passed under direct vision.  Throughout the                            procedure, the patient's blood pressure, pulse, and                            oxygen saturations were monitored continuously. The                            [id number]-H190 [id number] Olympus gastroscope was                            introduced through the mouth, and advanced to the                            second part of duodenum. The upper GI endoscopy was                            accomplished without difficulty. The patient                            tolerated the procedure well. Findings:      A 4 cm hiatal hernia was present.      One oozing cratered gastric ulcer with a visible vessel was found at the       pylorus. The lesion was 12 mm in largest dimension. Area was       successfully injected with 4 mL of a 1:10,000 solution of epinephrine       for hemostasis. Estimated blood loss: none. Fulguration to stop the       bleeding by bipolar probe was successful. Estimated blood loss: none.       Biopsies were taken with a cold forceps for Helicobacter pylori testing.      Two non-bleeding cratered duodenal ulcers with no stigmata of bleeding       were found in the duodenal bulb. The largest lesion was 12 mm in largest       dimension.      The images are on the colonoscopy report. The pyloric channel ulcer was       large and it exhibited a 1-2 mm visible vessel that was oozing. BICAP  was successfully applied to the area after epi injection. Two "kissing       ulcers" were found in the distal duodenal bulb. Impression:               - 4 cm hiatal hernia.                           - Oozing gastric ulcer with a visible vessel.                            Injected. Treated with bipolar cautery. Biopsied.                           - Non-bleeding duodenal ulcers with no stigmata of                            bleeding. Moderate Sedation:      Not Applicable - Patient had care per Anesthesia. Recommendation:           - Return patient to  hospital ward for ongoing care.                           - Clear liquid diet.                           - Continue present medications.                           - Await pathology results.                           - PPI BID x 1 month and then QD indefinitely.                           - Sucralfate TID x 1 month.                           - Restart anticoagulation in 3 days. Procedure Code(s):        --- Professional ---                           [id number], 33, Esophagogastroduodenoscopy, flexible,                            transoral; with control of bleeding, any method                           43239, Esophagogastroduodenoscopy, flexible,                            transoral; with biopsy, single or multiple Diagnosis Code(s):        --- Professional ---                           K44.9, Diaphragmatic hernia without obstruction or  gangrene                           K25.4, Chronic or unspecified gastric ulcer with                            hemorrhage                           K26.9, Duodenal ulcer, unspecified as acute or                            chronic, without hemorrhage or perforation                           D50.9, Iron deficiency anemia, unspecified                           K92.1, Melena (includes Hematochezia) CPT copyright 2019 American Medical Association. All rights reserved. The codes documented in this report are preliminary and upon coder review may  be revised to meet current compliance requirements. Carol Ada, MD Carol Ada, MD 04/20/2019 4:59:38 PM This report has been signed electronically. Number of Addenda: 0

## 2019-04-20 NOTE — Op Note (Addendum)
[facility] Patient Name: Kaitlyn Underwood Procedure Date: 04/20/2019 MRN: [id number] Attending MD: Carol Ada , MD Date of Birth: [birth year] CSN: [id number] Age: 78 Admit Type: Inpatient Procedure:                Colonoscopy Indications:              Hematochezia Providers:                Carol Ada, Jeoffrey Massed, RN, Marguerita Merles,                            Technician Referring MD:              Medicines:                Propofol per Anesthesia Complications:            No immediate complications. Estimated Blood Loss:     Estimated blood loss: none. Procedure:                Pre-Anesthesia Assessment:                           - Prior to the procedure, a History and Physical                            was performed, and patient medications and                            allergies were reviewed. The patient's tolerance of                            previous anesthesia was also reviewed. The risks                            and benefits of the procedure and the sedation                            options and risks were discussed with the patient.                            All questions were answered, and informed consent                            was obtained. Prior Anticoagulants: The patient has                            taken no previous anticoagulant or antiplatelet                            agents. ASA Grade Assessment: III - A patient with                            severe systemic disease. After reviewing the risks                            and benefits, the patient was  deemed in                            satisfactory condition to undergo the procedure.                           - Sedation was administered by an anesthesia                            professional. Deep sedation was attained.                           After obtaining informed consent, the colonoscope                            was passed under direct vision. Throughout the              procedure, the patient's blood pressure, pulse, and                            oxygen saturations were monitored continuously. The                            [id number] Olympus pediatric colonscope                            was introduced through the anus and advanced to the                            the cecum, identified by appendiceal orifice and                            ileocecal valve. The colonoscopy was performed                            without difficulty. The patient tolerated the                            procedure well. The quality of the bowel                            preparation was good. The ileocecal valve,                            appendiceal orifice, and rectum were photographed. Scope In: 4:33:29 PM Scope Out: 4:48:02 PM Scope Withdrawal Time: 0 hours 10 minutes 33 seconds  Total Procedure Duration: 0 hours 14 minutes 33 seconds  Findings:      A 2 mm polyp was found in the cecum. The polyp was sessile. The polyp       was removed with a cold snare. Resection and retrieval were complete.      Scattered small and large-mouthed diverticula were found in the sigmoid       colon and descending colon. Impression:               - One 2 mm polyp in the  cecum, removed with a cold                            snare. Resected and retrieved.                           - Diverticulosis in the sigmoid colon and in the                            descending colon. Moderate Sedation:      Not Applicable - Patient had care per Anesthesia. Recommendation:           - Return patient to hospital ward for ongoing care.                           - Clear liquid diet.                           - Continue present medications.                           - Await pathology results.                           - Repeat colonoscopy is not recommended due to                            current age (70 years or older) for surveillance. Procedure Code(s):        ---  Professional ---                           [phone], Colonoscopy, flexible; with removal of                            tumor(s), polyp(s), or other lesion(s) by snare                            technique Diagnosis Code(s):        --- Professional ---                           K63.5, Polyp of colon                           K92.1, Melena (includes Hematochezia)                           K57.30, Diverticulosis of large intestine without                            perforation or abscess without bleeding CPT copyright 2019 American Medical Association. All rights reserved. The codes documented in this report are preliminary and upon coder review may  be revised to meet current compliance requirements. Carol Ada, MD Carol Ada, MD 04/20/2019 4:53:17 PM This report has been signed electronically. Number of Addenda: 0

## 2019-04-20 NOTE — Plan of Care (Signed)

## 2019-04-20 NOTE — Progress Notes (Signed)
PROGRESS NOTE    Kaitlyn Underwood  [id number] DOB: 1941/01/21 DOA: 04/18/2019 PCP: Lowella Dandy, NP     Brief Narrative:  Kaitlyn Vasser Kingis a 78 y.o.femalewith medical history significant ofCVA, DM, A fib on Eliquis, HTN, HLD who presented with complaints of abdominal and chest pain. Pain described as sharp on the left chest with radiation to the abdomen. She reports associated SOB. Pain is intermittently recurrent. She did report BRBPR 2 days prior to ED visit.Patient did develop a rash on the right side several days ago. She was diagnosed with shingles by her PCP and started on medications, shingles now resolved and scabbed over.   In the [facility] emergency department, labs revealed hemoglobin of 10.6 (previous hemoglobin 13.3), FOBT positive.  CT abdomen pelvis revealed absent right kidney, mild fat stranding about the duodenum consistent with duodenitis, ulcer not ruled out.  Patient was transferred from [facility] ED to [facility] hospital for further GI evaluation.  She subsequently had 2 large bloody BM at [facility] 8/31. GI consulted.   New events last 24 hours / Subjective: No complaints this morning.  No further bowel movement since yesterday.  No complaints of any chest pain or abdominal pain.  Tolerating clear liquid diet this morning.  Assessment & Plan:   Active Problems:   Chronic atrial fibrillation   Diabetes mellitus due to underlying condition with unspecified complications [facility])   Essential hypertension   Chronic systolic heart failure (HCC)   COPD (chronic obstructive pulmonary disease) (HCC)   Duodenitis with hemorrhage   GI bleed -Hemoglobin baseline 12.5 in Dec 2019 per our records. Presented to [facility] with Hgb 10.6, now 9.9 this morning -FOBT positive per report from [place]. Also patient complained of BRBPR 2 days ago as well -Hold Eliquis  -Hemoglobin has remained stable, 9.5 this morning -GI consulted, planning for EGD/colonoscopy 9/1  Chronic systolic heart failure  -Previous echocardiogram 07/07/2018 revealed EF 25 to 35% -BNP 544. Does not appear to be fluid overloaded on examination -Patient received IV Lasix at time of admission.  Hold further Lasix dosing -Repeat echocardiogram showed EF 20 to 25%, left ventricular diffuse hypokinesis.  Functional MR.    Chronic atrial fibrillation -Hold Eliquis, resume Lopressor   Essential hypertension -Resume captopril   Hyperlipidemia -Resume Pravachol  Recovered shingles -Continues to have neuropathic pain at the site of healing shingles outbreak -Gabapentin  Depression -Continue Zoloft    DVT prophylaxis: SCD Code Status: Full Family Communication: None Disposition Plan: Pending GI evaluation, endoscopy   Consultants:   GI  Procedures:   None   Antimicrobials:  Anti-infectives (From admission, onward)   None        Objective: Vitals:   04/20/19 0426 04/20/19 0728 04/20/19 0828 04/20/19 0830  BP: (!) 131/99 (!) 148/90    Pulse: 68 (!) 105    Resp: 18 20    Temp: 98 F (36.7 C) 97.8 F (36.6 C)    TempSrc: Oral Oral    SpO2: 95% 94% 94% 94%  Weight:      Height:        Intake/Output Summary (Last 24 hours) at 04/20/2019 1057 Last data filed at 04/20/2019 0600 Gross per 24 hour  Intake 3 ml  Output 500 ml  Net -497 ml   Filed Weights   04/19/19 0100  Weight: 87.5 kg    Examination:  General exam: Appears calm and comfortable  Respiratory system: Clear to auscultation. Respiratory effort normal. No respiratory distress. No conversational dyspnea.  Cardiovascular  system: S1 & S2 heard, irregular rhythm, rate 90s. No murmurs. No pedal edema. Gastrointestinal system: Abdomen is nondistended, soft and nontender. Normal bowel sounds heard. Central nervous system: Alert and oriented. No focal neurological deficits. Speech clear.  Extremities: Symmetric in appearance  Skin: No rashes, lesions or ulcers on exposed skin  Psychiatry: Judgement and insight appear  normal. Mood & affect appropriate.   Data Reviewed: I have personally reviewed following labs and imaging studies  CBC: Recent Labs  Lab 04/19/19 0825 04/19/19 1244 04/19/19 1727 04/19/19 2244 04/20/19 0424  WBC 10.8*  --   --   --   --   HGB 9.9* 9.9* 10.2* 10.3* 9.5*  HCT 31.0* 31.0* 31.8* 31.8* 30.9*  MCV 101.0*  --   --   --   --   PLT 568*  --   --   --   --    Basic Metabolic Panel: Recent Labs  Lab 04/19/19 0825  NA 142  K 3.8  CL 103  CO2 27  GLUCOSE 107*  BUN 30*  CREATININE 0.93  CALCIUM 9.4   GFR: Estimated Creatinine Clearance: 54.2 mL/min (by C-G formula based on SCr of 0.93 mg/dL). Liver Function Tests: No results for input(s): AST, ALT, ALKPHOS, BILITOT, PROT, ALBUMIN in the last 168 hours. No results for input(s): LIPASE, AMYLASE in the last 168 hours. No results for input(s): AMMONIA in the last 168 hours. Coagulation Profile: Recent Labs  Lab 04/19/19 1446  INR 1.4*   Cardiac Enzymes: No results for input(s): CKTOTAL, CKMB, CKMBINDEX, TROPONINI in the last 168 hours. BNP (last 3 results) No results for input(s): PROBNP in the last 8760 hours. HbA1C: Recent Labs    04/19/19 0427  HGBA1C 5.5   CBG: Recent Labs  Lab 04/19/19 1718 04/19/19 1950 04/19/19 2356 04/20/19 0423 04/20/19 0724  GLUCAP 103* 103* 98 108* 105*   Lipid Profile: No results for input(s): CHOL, HDL, LDLCALC, TRIG, CHOLHDL, LDLDIRECT in the last 72 hours. Thyroid Function Tests: No results for input(s): TSH, T4TOTAL, FREET4, T3FREE, THYROIDAB in the last 72 hours. Anemia Panel: No results for input(s): VITAMINB12, FOLATE, FERRITIN, TIBC, IRON, RETICCTPCT in the last 72 hours. Sepsis Labs: No results for input(s): PROCALCITON, LATICACIDVEN in the last 168 hours.  Recent Results (from the past 240 hour(s))  SARS CORONAVIRUS 2 (TAT 6-24 HRS)     Status: None   Collection Time: 04/19/19 11:19 AM  Result Value Ref Range Status   SARS Coronavirus 2 NEGATIVE NEGATIVE  Final    Comment: (NOTE) SARS-CoV-2 target nucleic acids are NOT DETECTED. The SARS-CoV-2 RNA is generally detectable in upper and lower respiratory specimens during the acute phase of infection. Negative results do not preclude SARS-CoV-2 infection, do not rule out co-infections with other pathogens, and should not be used as the sole basis for treatment or other patient management decisions. Negative results must be combined with clinical observations, patient history, and epidemiological information. The expected result is Negative. Fact Sheet for Patients: SugarRoll.be Fact Sheet for Healthcare Providers: https://www.woods-mathews.com/ This test is not yet approved or cleared by the [place] FDA and  has been authorized for detection and/or diagnosis of SARS-CoV-2 by FDA under an Emergency Use Authorization (EUA). This EUA will remain  in effect (meaning this test can be used) for the duration of the COVID-19 declaration under Section 56 4(b)(1) of the Act, 21 U.S.C. section 360bbb-3(b)(1), unless the authorization is terminated or revoked sooner. Performed at [facility], [place]  [address], [place], [place] [address]       Radiology Studies: No results found.    Scheduled Meds: . budesonide  0.5 mg Nebulization Daily  . insulin aspart  0-20 Units Subcutaneous Q4H  . metoprolol tartrate  5 mg Intravenous Q8H  . pantoprazole (PROTONIX) IV  40 mg Intravenous Q12H  . sertraline  25 mg Oral Daily  . sodium chloride flush  3 mL Intravenous Q12H   Continuous Infusions: . sodium chloride    . sodium chloride       LOS: 1 day      Time spent: 25 minutes   Dessa Phi, DO [facility] [email or web address] 04/20/2019, 10:57 AM

## 2019-04-20 NOTE — Progress Notes (Signed)
New consent obtained for patient's EGD/colonoscopy with correct MD performing the procedure, Dr. Benson Norway. Bowel prep stopped at 1400 per Endo RN.  Patient informed.

## 2019-04-21 ENCOUNTER — Encounter (HOSPITAL_COMMUNITY): Payer: Self-pay | Admitting: Gastroenterology

## 2019-04-21 DIAGNOSIS — K264 Chronic or unspecified duodenal ulcer with hemorrhage: Secondary | ICD-10-CM

## 2019-04-21 DIAGNOSIS — I5022 Chronic systolic (congestive) heart failure: Secondary | ICD-10-CM

## 2019-04-21 DIAGNOSIS — I482 Chronic atrial fibrillation, unspecified: Secondary | ICD-10-CM

## 2019-04-21 DIAGNOSIS — K922 Gastrointestinal hemorrhage, unspecified: Secondary | ICD-10-CM

## 2019-04-21 DIAGNOSIS — K250 Acute gastric ulcer with hemorrhage: Secondary | ICD-10-CM

## 2019-04-21 LAB — CBC
HCT: 28.7 % — ABNORMAL LOW (ref 36.0–46.0)
Hemoglobin: 8.9 g/dL — ABNORMAL LOW (ref 12.0–15.0)
MCH: 31.9 pg (ref 26.0–34.0)
MCHC: 31 g/dL (ref 30.0–36.0)
MCV: 102.9 fL — ABNORMAL HIGH (ref 80.0–100.0)
Platelets: 533 10*3/uL — ABNORMAL HIGH (ref 150–400)
RBC: 2.79 MIL/uL — ABNORMAL LOW (ref 3.87–5.11)
RDW: 15.1 % (ref 11.5–15.5)
WBC: 11 10*3/uL — ABNORMAL HIGH (ref 4.0–10.5)
nRBC: 0 % (ref 0.0–0.2)

## 2019-04-21 LAB — GLUCOSE, CAPILLARY
Glucose-Capillary: 88 mg/dL (ref 70–99)
Glucose-Capillary: 80 mg/dL (ref 70–99)
Glucose-Capillary: 98 mg/dL (ref 70–99)
Glucose-Capillary: 81 mg/dL (ref 70–99)
Glucose-Capillary: 92 mg/dL (ref 70–99)

## 2019-04-21 LAB — URINALYSIS, ROUTINE W REFLEX MICROSCOPIC
Bilirubin Urine: NEGATIVE
Glucose, UA: NEGATIVE mg/dL
Hgb urine dipstick: NEGATIVE
Ketones, ur: NEGATIVE mg/dL
Nitrite: NEGATIVE
Protein, ur: NEGATIVE mg/dL
Specific Gravity, Urine: 1.005 (ref 1.005–1.030)
pH: 7 (ref 5.0–8.0)

## 2019-04-21 LAB — BASIC METABOLIC PANEL WITH GFR
Anion gap: 11 (ref 5–15)
BUN: 22 mg/dL (ref 8–23)
CO2: 26 mmol/L (ref 22–32)
Calcium: 9.4 mg/dL (ref 8.9–10.3)
Chloride: 104 mmol/L (ref 98–111)
Creatinine, Ser: 0.92 mg/dL (ref 0.44–1.00)
GFR calc Af Amer: >60 mL/min (ref >60)
GFR calc non Af Amer: >60 mL/min (ref >60)
Glucose, Bld: 98 mg/dL (ref 70–99)
Potassium: 4 mmol/L (ref 3.5–5.1)
Sodium: 141 mmol/L (ref 135–145)

## 2019-04-21 MED ORDER — GABAPENTIN 300 MG PO CAPS
300.0000 mg | ORAL_CAPSULE | Freq: Three times a day (TID) | ORAL | Status: DC
  Administered 2019-04-21 – 2019-04-22 (×4): 300 mg via ORAL
  Filled 2019-04-21 (×4): qty 1

## 2019-04-21 NOTE — Progress Notes (Signed)
PROGRESS NOTE  RAPHAELA WILLITTS [id number] DOB: 1941/03/14 DOA: 04/18/2019 PCP: Lowella Dandy, NP  HPI/Recap of past 24 hours:  Reports feeling sob with walking with PT,  Reports right side flank pain from healed singles Daughter at bedside  Assessment/Plan: Active Problems:   Chronic atrial fibrillation   Diabetes mellitus due to underlying condition with unspecified complications [facility])   Essential hypertension   Chronic systolic heart failure (HCC)   COPD (chronic obstructive pulmonary disease) ([place])   Duodenitis with hemorrhage   GI bleed with  gastric and duodenal ulcers -Hemoglobin baseline12.5 in Dec 2019 per our records. Presented to [facility] with Hgb 10.6, now 9.9 this morning -FOBT positive per report from [place]. Also patient complained of BRBPR 2 days ago as well -Hold Eliquis  -Hemoglobin 10-9 this morning, no indication for prbc transfusion - EGD/colonoscopy 9/1 + gastric and duodenal ulcers, likely from NSAIDs (aleve) use -ppi bid for a months then ppi daily per gi recommendation -gi oked to resume eliquis in three days -appreciate gi input  Chronicsystolic heart failure -Previous echocardiogram 07/07/2018 revealed EF 25 to 35%, Repeat echocardiogram on 8/31showed EF 20 to 25%, left ventricular diffuse hypokinesis.  Functional MR.   -BNP 544. Doesnot appear to be fluid overloaded on examination -Patient received IV Lasix at time of admission. Hold further Lasix dosing  Chronic atrial fibrillation -In afib, rate controlled on lopressor - Eliquis held since admission, resume in three days post egd per gi recommendation   Essential hypertension -Resume captopril tid, on lopressor and lasix  Hyperlipidemia -Resume Pravachol  Recovered shingles -Continues to have neuropathic pain at the site of healing shingles outbreak -Gabapentin  Depression -Continue Zoloft   FTT Home health  DVT Prophylaxis: scd  Code Status: full  Family  Communication: patient and daughter at bedside   Disposition Plan: home with home health in 24-48hrs if hgb stable, tolerate diet advancement with GI clearance   Consultants:  gi  Procedures:  egd on 9/1  Antibiotics:  none   Objective: BP (!) 156/87 (BP Location: Left Arm)   Pulse 93   Temp (!) 97.5 F (36.4 C)   Resp 18   Ht 5\' 4"  (1.626 m)   Wt 87.5 kg   SpO2 96%   BMI 33.11 kg/m   Intake/Output Summary (Last 24 hours) at 04/21/2019 1532 Last data filed at 04/21/2019 0600 Gross per 24 hour  Intake 453 ml  Output -  Net 453 ml   Filed Weights   04/19/19 0100  Weight: 87.5 kg    Exam: Patient is examined daily including today on 04/21/2019, exams remain the same as of yesterday except that has changed    General:  NAD  Cardiovascular: RRR  Respiratory: CTABL  Abdomen: Soft/ND/NT, positive BS  Musculoskeletal: No Edema  Neuro: alert, oriented   Data Reviewed: Basic Metabolic Panel: Recent Labs  Lab 04/19/19 0825 04/20/19 1608 04/21/19 0413  NA 142 139 141  K 3.8 4.8 4.0  CL 103  --  104  CO2 27  --  26  GLUCOSE 107*  --  98  BUN 30*  --  22  CREATININE 0.93  --  0.92  CALCIUM 9.4  --  9.4   Liver Function Tests: No results for input(s): AST, ALT, ALKPHOS, BILITOT, PROT, ALBUMIN in the last 168 hours. No results for input(s): LIPASE, AMYLASE in the last 168 hours. No results for input(s): AMMONIA in the last 168 hours. CBC: Recent Labs  Lab 04/19/19 0825  04/19/19 2244 04/20/19 0424 04/20/19 1308 04/20/19 1608 04/21/19 0413  WBC 10.8*  --   --   --   --   --  11.0*  HGB 9.9*   < > 10.3* 9.5* 9.4* 9.5* 8.9*  HCT 31.0*   < > 31.8* 30.9* 29.8* 28.0* 28.7*  MCV 101.0*  --   --   --   --   --  102.9*  PLT 568*  --   --   --   --   --  533*   < > = values in this interval not displayed.   Cardiac Enzymes:   No results for input(s): CKTOTAL, CKMB, CKMBINDEX, TROPONINI in the last 168 hours. BNP (last 3 results) Recent Labs     04/19/19 0825  BNP 544.3*    ProBNP (last 3 results) No results for input(s): PROBNP in the last 8760 hours.  CBG: Recent Labs  Lab 04/20/19 2008 04/20/19 2345 04/21/19 0425 04/21/19 0754 04/21/19 1147  GLUCAP 109* 84 88 80 98    Recent Results (from the past 240 hour(s))  SARS CORONAVIRUS 2 (TAT 6-24 HRS)     Status: None   Collection Time: 04/19/19 11:19 AM  Result Value Ref Range Status   SARS Coronavirus 2 NEGATIVE NEGATIVE Final    Comment: (NOTE) SARS-CoV-2 target nucleic acids are NOT DETECTED. The SARS-CoV-2 RNA is generally detectable in upper and lower respiratory specimens during the acute phase of infection. Negative results do not preclude SARS-CoV-2 infection, do not rule out co-infections with other pathogens, and should not be used as the sole basis for treatment or other patient management decisions. Negative results must be combined with clinical observations, patient history, and epidemiological information. The expected result is Negative. Fact Sheet for Patients: SugarRoll.be Fact Sheet for Healthcare Providers: https://www.woods-mathews.com/ This test is not yet approved or cleared by the [place] FDA and  has been authorized for detection and/or diagnosis of SARS-CoV-2 by FDA under an Emergency Use Authorization (EUA). This EUA will remain  in effect (meaning this test can be used) for the duration of the COVID-19 declaration under Section 56 4(b)(1) of the Act, 21 U.S.C. section 360bbb-3(b)(1), unless the authorization is terminated or revoked sooner. Performed at [facility], [place] [address], [place], [place] [address]      Studies: Dg Chest Port 1 View  Result Date: 04/20/2019 CLINICAL DATA:  Weakness EXAM: PORTABLE CHEST 1 VIEW COMPARISON:  04/18/2019 FINDINGS: The right costophrenic angle is excluded from the field of view. Stable mild cardiomegaly. Calcific aortic knob. No focal  airspace consolidation, pleural effusion, or pneumothorax. The visualized skeletal structures are unchanged. IMPRESSION: No acute cardiopulmonary findings.  No interval change from prior. Electronically Signed   By: Davina Poke M.D.   On: 04/20/2019 15:51    Scheduled Meds: . budesonide  0.5 mg Nebulization Daily  . captopril  50 mg Oral Q8H  . furosemide  40 mg Oral Daily  . insulin aspart  0-20 Units Subcutaneous Q4H  . metoprolol tartrate  12.5 mg Oral BID  . pantoprazole (PROTONIX) IV  40 mg Intravenous Q12H  . pravastatin  20 mg Oral q1800  . sertraline  25 mg Oral Daily  . sodium chloride flush  3 mL Intravenous Q12H  . sucralfate  1 g Oral TID WC & HS    Continuous Infusions: . sodium chloride       Time spent: 76mins I have personally reviewed and interpreted on  04/21/2019 daily labs,  tele strips, imagings as discussed above under date review session and assessment and plans.  I reviewed all nursing notes, pharmacy notes, consultant notes,  vitals, pertinent old records  I have discussed plan of care as described above with RN , patient and family on 04/21/2019   Florencia Reasons MD, PhD, FACP  [facility] Pager [phone]. If 7PM-7AM, please contact night-coverage at [email or web address], password [facility] 04/21/2019, 3:32 PM  LOS: 2 days

## 2019-04-21 NOTE — Progress Notes (Signed)
UNASSIGNED PATIENT Subjective: Since I last evaluated the patient, she seems to be doing really well; she has not had any further bleeding after she had her EGD with control of bleeding yesterday; she is tolerating clears well. She denies having any abdominal pain or nausea, no rectal bleeding or melena.  Objective: Vital signs in last 24 hours: Temp:  [97.5 F (36.4 C)-98 F (36.7 C)] 97.5 F (36.4 C) (09/02 1309) Pulse Rate:  [77-93] 93 (09/02 1309) Resp:  [18] 18 (09/02 1309) BP: (122-156)/(68-87) 156/87 (09/02 1309) SpO2:  [95 %-100 %] 96 % (09/02 1309) Last BM Date: 04/20/19  Intake/Output from previous day: 09/01 0701 - 09/02 0700 In: 453 [I.V.:453] Out: -  Intake/Output this shift: Total I/O In: -  Out: 850 [Urine:850]  hysical Exam  Constitutional: She appears well-developed and well-nourished.  HENT:  Head: Normocephalic and atraumatic.  Eyes: Pupils are equal, round, and reactive to light. Conjunctivae and EOM are normal.  Neck: Normal range of motion. Neck supple.  Cardiovascular: An irregularly irregular rhythm present.  Respiratory: Effort normal and breath sounds normal.  GI: Soft. Normal appearance and bowel sounds are nor  Lab Results: Recent Labs    04/19/19 0825  04/20/19 1308 04/20/19 1608 04/21/19 0413  WBC 10.8*  --   --   --  11.0*  HGB 9.9*   < > 9.4* 9.5* 8.9*  HCT 31.0*   < > 29.8* 28.0* 28.7*  PLT 568*  --   --   --  533*   < > = values in this interval not displayed.   BMET Recent Labs    04/19/19 0825 04/20/19 1608 04/21/19 0413  NA 142 139 141  K 3.8 4.8 4.0  CL 103  --  104  CO2 27  --  26  GLUCOSE 107*  --  98  BUN 30*  --  22  CREATININE 0.93  --  0.92  CALCIUM 9.4  --  9.4   LFT No results for input(s): PROT, ALBUMIN, AST, ALT, ALKPHOS, BILITOT, BILIDIR, IBILI in the last 72 hours. PT/INR Recent Labs    04/19/19 1446  LABPROT 16.8*  INR 1.4*   Studies/Results: Dg Chest Port 1 View  Result Date:  04/20/2019 CLINICAL DATA:  Weakness EXAM: PORTABLE CHEST 1 VIEW COMPARISON:  04/18/2019 FINDINGS: The right costophrenic angle is excluded from the field of view. Stable mild cardiomegaly. Calcific aortic knob. No focal airspace consolidation, pleural effusion, or pneumothorax. The visualized skeletal structures are unchanged. IMPRESSION: No acute cardiopulmonary findings.  No interval change from prior. Electronically Signed   By: Davina Poke M.D.   On: 04/20/2019 15:51   Medications: I have reviewed the patient's current medications.  Assessment/Plan: 1) Severe anemia secondary to gastric and duodenal ulcers-from taking large doses of NSAIDS-Aleve-will advance her diet and follow up with her on an OP basis post discharge; she has strongly been advised to stop the use of all NSAIDS top prevent future complications from PUD/GI bleeding. She should be discharged on PPI's.   2) Personal history of a colonic polyp removed in the past-patient states this is over 10 years ago. 3) Chronic systolic heart failure with EF of 25 to 35%. 4) Hpertension/Hyperlipidemia. 5) Shingles on the abdomen right upper quadrant. 6) Depression on Zoloft.     LOS: 2 days   Kaitlyn Underwood 04/21/2019, 6:05 PM

## 2019-04-21 NOTE — Evaluation (Signed)
Occupational Therapy Evaluation Patient Details Name: Kaitlyn Underwood MRN: [id number] DOB: 1940-08-22 Today's Date: 04/21/2019    History of Present Illness   Kaitlyn Vallas Kingis a 78 y.o.femalewith medical history significant ofCVA, DM, A fib on Eliquis, HTN, HLD who presented with complaints of abdominaland chest pain. Pain described as sharp on the left chest with radiation to the abdomen. She reports associated SOB. Pain is intermittently recurrent. She did report BRBPR 2 days prior to ED visit.Patient did develop a rash on the right side several days ago. She was diagnosed with shingles by her PCP and started on medications, shingles now resolved and scabbed over.     Clinical Impression  Pt admitted with abdominal and chest pain.Marland Kitchen Pt currently with functional limitations due to the deficits listed below (see OT Problem List).  Pt will benefit from skilled OT to increase their safety and independence with ADL and functional mobility for ADL to facilitate discharge to venue listed below.   [name] with ADL activity. Pts wants to get stronger so she can 'do for her self.       Follow Up Recommendations  Home health OT;Supervision/Assistance - 24 hour    Equipment Recommendations  None recommended by OT    Recommendations for Other Services       Precautions / Restrictions   falls     Mobility Bed Mobility      pt in chair            Transfers        pt overall mid- mod A with sit to stand and stand pivot transfer this OT visit.  Pt needed VC for hand placement as well as encouragement.              Balance Overall balance assessment: Needs assistance Sitting-balance support: No upper extremity supported Sitting balance-Leahy Scale: Good     Standing balance support: Bilateral upper extremity supported Standing balance-Leahy Scale: Fair                             ADL either performed or assessed with clinical judgement   ADL  Overall ADL's : Needs assistance/impaired Eating/Feeding: Set up;Sitting   Grooming: Brushing hair;Sitting;Set up   Upper Body Bathing: Set up;Sitting   Lower Body Bathing: Maximal assistance;Sit to/from stand;Cueing for safety;Cueing for sequencing   Upper Body Dressing : Set up;Sitting   Lower Body Dressing: Maximal assistance;Sit to/from stand;Cueing for sequencing;Cueing for safety   Toilet Transfer: Stand-pivot;Moderate assistance;BSC;Cueing for sequencing   Toileting- Clothing Manipulation and Hygiene: Sit to/from stand;Cueing for sequencing;Cueing for safety;Moderate assistance         General ADL Comments: pts husband and daugther are very supportive.                  Pertinent Vitals/Pain  pt complaining of pain  in back area where shingles were. RN present and aware.Pt states pain 4/10           Communication Communication Communication: No difficulties   Cognition Arousal/Alertness: Awake/alert Behavior During Therapy: WFL for tasks assessed/performed Overall Cognitive Status: Within Functional Limits for tasks assessed                                 General Comments: pt a bit slow to respond at times but did follow all directions.  Pt has a history  of CVA.  Per old notes pt with aphasia last hospital visit              Home Living Family/patient expects to be discharged to:: Private residence Living Arrangements: Children;Spouse/significant other Available Help at Discharge: Family;Available PRN/intermittently Type of Home: House Home Access: Stairs to enter     Home Layout: One level     Bathroom Shower/Tub: Teacher, early years/pre: Standard     Home Equipment: Environmental consultant - 2 wheels;Shower seat          Prior Functioning/Environment Level of Independence: Needs assistance  Gait / Transfers Assistance Needed: uses RW for ambulation ADL's / Homemaking Assistance Needed: Mod Ind with bathing, dressing             OT Problem List: Decreased strength;Decreased activity tolerance;Impaired balance (sitting and/or standing);Decreased safety awareness      OT Treatment/Interventions: Self-care/ADL training;Patient/family education;DME and/or AE instruction;Therapeutic activities    OT Goals(Current goals can be found in the care plan section) Acute Rehab OT Goals Patient Stated Goal: home with husband OT Goal Formulation: With patient Time For Goal Achievement: 04/28/19  OT Frequency: Min 2X/week    AM-PAC OT "6 Clicks" Daily Activity     Outcome Measure Help from another person eating meals?: None Help from another person taking care of personal grooming?: None Help from another person toileting, which includes using toliet, bedpan, or urinal?: A Little Help from another person bathing (including washing, rinsing, drying)?: A Little Help from another person to put on and taking off regular upper body clothing?: A Little Help from another person to put on and taking off regular lower body clothing?: A Lot 6 Click Score: 19   End of Session Equipment Utilized During Treatment: Rolling walker;Gait belt Nurse Communication: Mobility status  Activity Tolerance: Patient tolerated treatment well Patient left: in chair;with call bell/phone within reach  OT Visit Diagnosis: Unsteadiness on feet (R26.81);Other abnormalities of gait and mobility (R26.89);Muscle weakness (generalized) (M62.81)                Time: [id number] OT Time Calculation (min): 20 min Charges:  OT General Charges $OT Visit: 1 Visit OT Evaluation $OT Eval Moderate Complexity: 1 Mod  Kari Baars, [place] [phone] Office- [phone], Edwena Felty D 04/21/2019, 2:46 PM

## 2019-04-21 NOTE — Evaluation (Signed)
Physical Therapy Evaluation Patient Details Name: Kaitlyn Underwood MRN: [id number] DOB: October 14, 1940 Today's Date: 04/21/2019   History of Present Illness  78 yo female admitted with A fib, GI bleed. Hx of CVA, DM, A fib, shingles, COPD  Clinical Impression  On eval, pt required Min assist for mobility. She walked ~50 feet with a RW. Pt presents with general weakness, decreased activity tolerance, and impaired gait and balance. Dyspnea 2/4, HR up to 122 bpm with activity. Will follow during hospital stay.     Follow Up Recommendations Home health PT;Supervision/Assistance - 24 hour    Equipment Recommendations  None recommended by PT    Recommendations for Other Services       Precautions / Restrictions Precautions Precautions: Fall Restrictions Weight Bearing Restrictions: No      Mobility  Bed Mobility Overal bed mobility: Needs Assistance Bed Mobility: Supine to Sit     Supine to sit: Min guard     General bed mobility comments: increased time and effort. min guard for safety  Transfers Overall transfer level: Needs assistance Equipment used: Rolling walker (2 wheeled) Transfers: Sit to/from [facility] Sit to Stand: Min assist Stand pivot transfers: Min assist       General transfer comment: x2. Assist to rise, stabilize, control descent. VCs safety, technique, hand placement. Stand pivot, bed to bsc, with RW  Ambulation/Gait Ambulation/Gait assistance: Min assist Gait Distance (Feet): 50 Feet Assistive device: Rolling walker (2 wheeled) Gait Pattern/deviations: Step-through pattern;Decreased stride length;Trunk flexed     General Gait Details: Assist to stabilize pt and to maneuver safely with RW. Dyspnea 2/4. HR up to 122 bpm. Pt was fatigued after walk.  Stairs            Wheelchair Mobility    Modified Rankin (Stroke Patients Only)       Balance Overall balance assessment: Needs assistance Sitting-balance support: No upper  extremity supported Sitting balance-Leahy Scale: Good     Standing balance support: Bilateral upper extremity supported Standing balance-Leahy Scale: Poor                               Pertinent Vitals/Pain Pain Assessment: No/denies pain    Home Living Family/patient expects to be discharged to:: Private residence Living Arrangements: Children;Spouse/significant other Available Help at Discharge: Family;Available PRN/intermittently Type of Home: House Home Access: Stairs to enter     Home Layout: One level Home Equipment: Environmental consultant - 2 wheels;Shower seat      Prior Function Level of Independence: Needs assistance   Gait / Transfers Assistance Needed: uses RW for ambulation  ADL's / Homemaking Assistance Needed: Mod Ind with bathing, dressing        Hand Dominance        Extremity/Trunk Assessment   Upper Extremity Assessment Upper Extremity Assessment: Defer to OT evaluation    Lower Extremity Assessment Lower Extremity Assessment: Generalized weakness    Cervical / Trunk Assessment Cervical / Trunk Assessment: Normal  Communication   Communication: No difficulties  Cognition Arousal/Alertness: Awake/alert Behavior During Therapy: WFL for tasks assessed/performed Overall Cognitive Status: Within Functional Limits for tasks assessed                                 General Comments: pt a bit slow to respond at times but did follow all directions.  Pt has a history of CVA.  Per old notes pt with aphasia last hospital visit      General Comments      Exercises     Assessment/Plan    PT Assessment Patient needs continued PT services  PT Problem List Decreased strength;Decreased mobility;Decreased activity tolerance;Decreased balance;Decreased knowledge of use of DME       PT Treatment Interventions DME instruction;Gait training;Therapeutic exercise;Therapeutic activities;Patient/family education;Functional mobility  training;Balance training    PT Goals (Current goals can be found in the Care Plan section)  Acute Rehab PT Goals Patient Stated Goal: home PT Goal Formulation: With patient Time For Goal Achievement: 05/05/19 Potential to Achieve Goals: Good    Frequency Min 3X/week   Barriers to discharge        Co-evaluation               AM-PAC PT "6 Clicks" Mobility  Outcome Measure Help needed turning from your back to your side while in a flat bed without using bedrails?: A Little Help needed moving from lying on your back to sitting on the side of a flat bed without using bedrails?: A Little Help needed moving to and from a bed to a chair (including a wheelchair)?: A Little Help needed standing up from a chair using your arms (e.g., wheelchair or bedside chair)?: A Little Help needed to walk in hospital room?: A Little Help needed climbing 3-5 steps with a railing? : A Little 6 Click Score: 18    End of Session Equipment Utilized During Treatment: Gait belt Activity Tolerance: Patient tolerated treatment well Patient left: in chair;with call bell/phone within reach;with family/visitor present   PT Visit Diagnosis: Muscle weakness (generalized) (M62.81);Unsteadiness on feet (R26.81);Difficulty in walking, not elsewhere classified (R26.2)    Time: [id number] PT Time Calculation (min) (ACUTE ONLY): 15 min   Charges:   PT Evaluation $PT Eval Moderate Complexity: [place], PT Acute Rehabilitation Services Pager: [phone] Office: [phone]

## 2019-04-22 LAB — CBC WITH DIFFERENTIAL/PLATELET
Abs Immature Granulocytes: 0.04 10*3/uL (ref 0.00–0.07)
Basophils Absolute: 0.1 10*3/uL (ref 0.0–0.1)
Basophils Relative: 1 %
Eosinophils Absolute: 0.2 10*3/uL (ref 0.0–0.5)
Eosinophils Relative: 2 %
HCT: 28.4 % — ABNORMAL LOW (ref 36.0–46.0)
Hemoglobin: 8.9 g/dL — ABNORMAL LOW (ref 12.0–15.0)
Immature Granulocytes: 0 %
Lymphocytes Relative: 34 %
Lymphs Abs: 3.7 10*3/uL (ref 0.7–4.0)
MCH: 31.9 pg (ref 26.0–34.0)
MCHC: 31.3 g/dL (ref 30.0–36.0)
MCV: 101.8 fL — ABNORMAL HIGH (ref 80.0–100.0)
Monocytes Absolute: 0.8 10*3/uL (ref 0.1–1.0)
Monocytes Relative: 7 %
Neutro Abs: 6 10*3/uL (ref 1.7–7.7)
Neutrophils Relative %: 56 %
Platelets: 504 10*3/uL — ABNORMAL HIGH (ref 150–400)
RBC: 2.79 MIL/uL — ABNORMAL LOW (ref 3.87–5.11)
RDW: 15.1 % (ref 11.5–15.5)
WBC: 10.8 10*3/uL — ABNORMAL HIGH (ref 4.0–10.5)
nRBC: 0 % (ref 0.0–0.2)

## 2019-04-22 LAB — GLUCOSE, CAPILLARY
Glucose-Capillary: 91 mg/dL (ref 70–99)
Glucose-Capillary: 87 mg/dL (ref 70–99)
Glucose-Capillary: 87 mg/dL (ref 70–99)
Glucose-Capillary: 111 mg/dL — ABNORMAL HIGH (ref 70–99)

## 2019-04-22 LAB — MAGNESIUM: Magnesium: 2 mg/dL (ref 1.7–2.4)

## 2019-04-22 LAB — BASIC METABOLIC PANEL
Anion gap: 10 (ref 5–15)
BUN: 17 mg/dL (ref 8–23)
CO2: 25 mmol/L (ref 22–32)
Calcium: 9.1 mg/dL (ref 8.9–10.3)
Chloride: 102 mmol/L (ref 98–111)
Creatinine, Ser: 0.87 mg/dL (ref 0.44–1.00)
GFR calc Af Amer: >60 mL/min (ref >60)
GFR calc non Af Amer: >60 mL/min (ref >60)
Glucose, Bld: 94 mg/dL (ref 70–99)
Potassium: 3.6 mmol/L (ref 3.5–5.1)
Sodium: 137 mmol/L (ref 135–145)

## 2019-04-22 MED ORDER — PANTOPRAZOLE SODIUM 40 MG PO TBEC: 40.0000 mg | DELAYED_RELEASE_TABLET | Freq: Two times a day (BID) | ORAL | 1 refills | Status: AC

## 2019-04-22 MED ORDER — SUCRALFATE 1 G PO TABS: 1.0000 g | ORAL_TABLET | Freq: Three times a day (TID) | ORAL | 0 refills | Status: AC

## 2019-04-22 NOTE — Care Management Important Message (Signed)
Important Message  Patient Details IM Letter given to Cookie McGibboney RN to present to the Patient Name: Kaitlyn Underwood MRN: [id number] Date of Birth: 1941-03-16   Medicare Important Message Given:  Yes     Kerin Salen 04/22/2019, 11:54 AM

## 2019-04-22 NOTE — TOC Progression Note (Signed)
Transition of Care [facility]) - Progression Note    Patient Details  Name: Kaitlyn Underwood MRN: [id number] Date of Birth: 07/12/1941  Transition of Care [facility]) CM/SW Contact  Purcell Mouton, RN Phone Number: 04/22/2019, 2:25 PM  Clinical Narrative:    Pt's daughter Juliann Pulse aware that related to [place], start of care is next week. Juliann Pulse was [place] with that.    Expected Discharge Plan: [place]    Expected Discharge Plan and Services Expected Discharge Plan: [place]   Discharge Planning Services: CM Consult     Expected Discharge Date: 04/22/19                         HH Arranged: RN, PT [place] Agency: [place] Date [place]: 04/22/19 Time [place]: 1422 Representative spoke with at [place]: [place] ([place]) Interventions    Readmission Risk Interventions No flowsheet data found.

## 2019-04-22 NOTE — Discharge Summary (Signed)
Discharge Summary  Kaitlyn Underwood [id number] DOB: [birth year]  PCP: Lowella Dandy, NP  Admit date: 04/18/2019 Discharge date: 04/22/2019  Time spent: 26mins, more than 50% time spent on coordination of care.  Recommendations for Outpatient Follow-up:  1. F/u with PCP within a week  for hospital discharge follow up, repeat cbc/bmp at follow up 2. F/u with GI for gastric ulcer/duodenal ulcer 3. F/u with cardiology for afib and heart failure 4. F/u with neurology for h/o stroke, memory impairment 5. Home health arranged   Discharge Diagnoses:  Active Hospital Problems   Diagnosis Date Noted   Acute gastric ulcer with hemorrhage    Duodenal ulcer with hemorrhage    Acute upper GI bleed    Duodenitis with hemorrhage [id number]   Chronic systolic heart failure ([place]) 07/06/2018   COPD (chronic obstructive pulmonary disease) ([place]) 07/06/2018   Chronic atrial fibrillation 05/16/2015   Diabetes mellitus due to underlying condition with unspecified complications ([place]) [id number]   Essential hypertension 05/16/2015    Resolved Hospital Problems  No resolved problems to display.    Discharge Condition: stable  Diet recommendation: heart healthy  Filed Weights   04/19/19 0100  Weight: 87.5 kg    History of present illness: (per admitting MD Dr Linda Hedges) Patient coming from: transfrer from [facility], she had presented from home  I have personally briefly reviewed patient's old medical records in [place]  Chief Complaint: abdominal and chest pain.  HPI: Kaitlyn Underwood is a 78 y.o. female with medical history significant of CVA, DM, a fib on Eliquis, HTN, HLD who presented with c/o abdominaland chest pain. Pain described as sharp on the left chest with radiation to the abdomen. She reports associated SOB. Pain is intermittently recurrent. She did report BRBPR 2 days prior to ED visit. Patient did develop a rash on the right side several days ago. She was diagnosed with shingles  by her PCP and started on medications.   (For level 3, the HPI must include 4+ descriptors: Location, Quality, Severity, Duration, Timing, Context, modifying factors, associated signs/symptoms and/or status of 3+ chronic problems.)  (Please avoid self-populating past medical history here) (The initial 2-3 lines should be focused and good to copy and paste in the HPI section of the daily progress note).  ED Course: Patient was hemodynamically stable. Lab revealed Hgb 10.6 down from previous of 13.3.  Chart review did reveal previous anemia as low as 11.  BNP elevated at 5,500. CT angio was negative for PE. CT abd/pelvis - absent right kidney, mild fat stranding about the duodenum c/w duodenitis, ulcer not ruled out. CXR w/ mild cardiomegaly but NAD. Exam revealed abdominal tenderness, heme positive stool without red blood, w/o hemorrhoids. Referrerd for admission for further GI workup, monitoring of Hgb and mgt of cardiac issues with elevated BNP.  Hospital Course:  Active Problems:   Chronic atrial fibrillation   Diabetes mellitus due to underlying condition with unspecified complications [facility])   Essential hypertension   Chronic systolic heart failure ([facility])   COPD (chronic obstructive pulmonary disease) ([facility])   Duodenitis with hemorrhage   Acute gastric ulcer with hemorrhage   Duodenal ulcer with hemorrhage   Acute upper GI bleed   GI bleed with gastric and duodenal ulcers -Hemoglobin baseline12.5 in Dec 2019 per our records. [facility]ed to [facility] with Hgb 10.6, now 9.9 this morning -FOBT positive per report from [place]. Also patient complained of BRBPR 2 days ago as well -Hold Eliquis, restart on 9/4 per GI  recommendation -Hemoglobin 10-8.9-8.9 , no indication for prbc transfusion - EGD/colonoscopy 9/1 +gastric and duodenal ulcers, daughter reports patient does not take NSAIDs at all -ppi bid for a months then ppi daily per gi recommendation -carafate for a month -appreciate gi  input. She is to follow up with pcp and GI  Chronicsystolic heart failure -Previous echocardiogram 07/07/2018 revealed EF 25 to 35%, Repeat echocardiogramon 8/31showed EF 20 to 25%, left ventricular diffuse hypokinesis. Functional MR.  -BNP 544. Doesnot appear to be fluid overloaded on examination -Patient received IV Lasix at time of admission. then resume home oral lasix -stable, euvolemic at discharge  Chronic atrial fibrillation with h/o CVA , memory impairment -In afib, rate controlled on lopressor - Eliquis held since admission, resume in three days post egd per gi recommendation ( resume on 9/4) F/u with cardiology and neurology   Essential hypertension -Resumecaptopril tid, on lopressor and lasix  Hyperlipidemia -Resume Pravachol  Recovered shingles -Continues to have neuropathic pain at the site of healing shingles outbreak -Gabapentin  Depression -Continue Zoloft   FTT Home health  DVT Prophylaxis: scd  Code Status: full  Family Communication: patient at bedside and daughter over the phone  Disposition Plan: home with home health    Consultants:  gi  Procedures:  egd on 9/1  Antibiotics:  none   Discharge Exam: BP 129/75 (BP Location: Left Arm)    Pulse 76    Temp 98 F (36.7 C)    Resp 18    Ht 5\' 4"  (1.626 m)    Wt 87.5 kg    SpO2 96%    BMI 33.11 kg/m     General:  NAD  Cardiovascular: RRR  Respiratory: CTABL  Abdomen: Soft/ND/NT, positive BS  Musculoskeletal: No Edema  Neuro: alert, oriented to person, slightly confused about the time and place, able to reorient , likely baseline, she reports memory deficit after cva   Discharge Instructions You were cared for by a hospitalist during your hospital stay. If you have any questions about your discharge medications or the care you received while you were in the hospital after you are discharged, you can call the unit and asked to speak with the hospitalist on  call if the hospitalist that took care of you is not available. Once you are discharged, your primary care physician will handle any further medical issues. Please note that NO REFILLS for any discharge medications will be authorized once you are discharged, as it is imperative that you return to your primary care physician (or establish a relationship with a primary care physician if you do not have one) for your aftercare needs so that they can reassess your need for medications and monitor your lab values.  Discharge Instructions    Diet - low sodium heart healthy   Complete by: As directed    Increase activity slowly   Complete by: As directed      Allergies as of 04/22/2019      Reactions   Sulfa Antibiotics Anaphylaxis   tongue      Medication List    TAKE these medications   acetaminophen 325 MG tablet Commonly known as: TYLENOL Take 2 tablets (650 mg total) by mouth every 4 (four) hours as needed for mild pain (or temp > 37.5 C (99.5 F)). Notes to patient: Last dose 9/3 12noon   albuterol 108 (90 Base) MCG/ACT inhaler Commonly known as: VENTOLIN HFA Inhale 2 puffs into the lungs every 6 (six) hours as needed for wheezing  or shortness of breath.   apixaban 5 MG Tabs tablet Commonly known as: ELIQUIS Take 1 tablet (5 mg total) by mouth 2 (two) times daily.   budesonide 0.5 MG/2ML nebulizer solution Commonly known as: PULMICORT Take 0.5 mg by nebulization daily.   captopril 50 MG tablet Commonly known as: CAPOTEN TAKE 1 TABLET BY MOUTH THREE TIMES DAILY   furosemide 40 MG tablet Commonly known as: LASIX Take 40 mg by mouth daily.   gabapentin 300 MG capsule Commonly known as: NEURONTIN Take 300 mg by mouth 3 (three) times daily.   ipratropium-albuterol 0.5-2.5 (3) MG/3ML Soln Commonly known as: DUONEB Take 3 mLs by nebulization every 6 (six) hours as needed (shortness of breath/wheezing.).   metoprolol tartrate 25 MG tablet Commonly known as: LOPRESSOR Take 0.5  tablets (12.5 mg total) by mouth 2 (two) times daily.   montelukast 10 MG tablet Commonly known as: SINGULAIR Take 1 tablet (10 mg total) by mouth daily.   nitroGLYCERIN 0.4 MG SL tablet Commonly known as: NITROSTAT Place 0.4 mg under the tongue every 5 (five) minutes as needed for chest pain.   pantoprazole 40 MG tablet Commonly known as: Protonix Take 1 tablet (40 mg total) by mouth 2 (two) times daily.   pravastatin 20 MG tablet Commonly known as: PRAVACHOL Take 1 tablet (20 mg total) by mouth daily at 6 PM.   sertraline 25 MG tablet Commonly known as: ZOLOFT Take 1 tablet (25 mg total) by mouth daily. What changed: how much to take   sucralfate 1 g tablet Commonly known as: CARAFATE Take 1 tablet (1 g total) by mouth 4 (four) times daily -  with meals and at bedtime.   traMADol 50 MG tablet Commonly known as: ULTRAM Take 50 mg by mouth 3 (three) times daily.      Allergies  Allergen Reactions   Sulfa Antibiotics Anaphylaxis    tongue   Follow-up Information    Moon, Amy A, NP Follow up in 1 week(s).   Specialty: Internal Medicine Why: hospital discharge follow up, repeat cbc/bmp at follow up, pcp to refer you to local GI doctor to follow up on GI bleed Contact information: [place] [address] [phone]        Carol Ada, MD. Call.   Specialty: Gastroenterology Why: follow up with EGD biopsy result Contact information: [place], [place] [address] [phone]        Revankar, Reita Cliche, MD Follow up in 1 month(s).   Specialty: Cardiology Why: for afib/heart failure Contact information: [address] [place] [address] [phone]            The results of significant diagnostics from this hospitalization (including imaging, microbiology, ancillary and laboratory) are listed below for reference.    Significant Diagnostic Studies: Dg Chest Port 1 View  Result Date:  04/20/2019 CLINICAL DATA:  Weakness EXAM: PORTABLE CHEST 1 VIEW COMPARISON:  04/18/2019 FINDINGS: The right costophrenic angle is excluded from the field of view. Stable mild cardiomegaly. Calcific aortic knob. No focal airspace consolidation, pleural effusion, or pneumothorax. The visualized skeletal structures are unchanged. IMPRESSION: No acute cardiopulmonary findings.  No interval change from prior. Electronically Signed   By: Davina Poke M.D.   On: 04/20/2019 15:51    Microbiology: Recent Results (from the past 240 hour(s))  SARS CORONAVIRUS 2 (TAT 6-24 HRS)     Status: None   Collection Time: 04/19/19 11:19 AM  Result Value Ref Range  Status   SARS Coronavirus 2 NEGATIVE NEGATIVE Final    Comment: (NOTE) SARS-CoV-2 target nucleic acids are NOT DETECTED. The SARS-CoV-2 RNA is generally detectable in upper and lower respiratory specimens during the acute phase of infection. Negative results do not preclude SARS-CoV-2 infection, do not rule out co-infections with other pathogens, and should not be used as the sole basis for treatment or other patient management decisions. Negative results must be combined with clinical observations, patient history, and epidemiological information. The expected result is Negative. Fact Sheet for Patients: SugarRoll.be Fact Sheet for Healthcare Providers: https://www.woods-mathews.com/ This test is not yet approved or cleared by the [place] FDA and  has been authorized for detection and/or diagnosis of SARS-CoV-2 by FDA under an Emergency Use Authorization (EUA). This EUA will remain  in effect (meaning this test can be used) for the duration of the COVID-19 declaration under Section 56 4(b)(1) of the Act, 21 U.S.C. section 360bbb-3(b)(1), unless the authorization is terminated or revoked sooner. Performed at [facility], [place] [address], [place], [place] [address]      Labs: Basic  Metabolic Panel: Recent Labs  Lab 04/19/19 0825 04/20/19 1608 04/21/19 0413 04/22/19 0420  NA 142 139 141 137  K 3.8 4.8 4.0 3.6  CL 103  --  104 102  CO2 27  --  26 25  GLUCOSE 107*  --  98 94  BUN 30*  --  22 17  CREATININE 0.93  --  0.92 0.87  CALCIUM 9.4  --  9.4 9.1  MG  --   --   --  2.0   Liver Function Tests: No results for input(s): AST, ALT, ALKPHOS, BILITOT, PROT, ALBUMIN in the last 168 hours. No results for input(s): LIPASE, AMYLASE in the last 168 hours. No results for input(s): AMMONIA in the last 168 hours. CBC: Recent Labs  Lab 04/19/19 0825  04/20/19 0424 04/20/19 1308 04/20/19 1608 04/21/19 0413 04/22/19 0420  WBC 10.8*  --   --   --   --  11.0* 10.8*  NEUTROABS  --   --   --   --   --   --  6.0  HGB 9.9*   < > 9.5* 9.4* 9.5* 8.9* 8.9*  HCT 31.0*   < > 30.9* 29.8* 28.0* 28.7* 28.4*  MCV 101.0*  --   --   --   --  102.9* 101.8*  PLT 568*  --   --   --   --  533* 504*   < > = values in this interval not displayed.   Cardiac Enzymes: No results for input(s): CKTOTAL, CKMB, CKMBINDEX, TROPONINI in the last 168 hours. BNP: BNP (last 3 results) Recent Labs    04/19/19 0825  BNP 544.3*    ProBNP (last 3 results) No results for input(s): PROBNP in the last 8760 hours.  CBG: Recent Labs  Lab 04/21/19 2044 04/22/19 0114 04/22/19 0352 04/22/19 0729 04/22/19 1119  GLUCAP 92 91 87 87 111*       Signed:  Florencia Reasons MD, PhD, FACP  [facility] 04/22/2019, 12:30 PM

## 2019-04-22 NOTE — Progress Notes (Signed)
Occupational Therapy Treatment Patient Details Name: Kaitlyn Underwood MRN: [id number] DOB: [birth year] Today's Date: 04/22/2019    History of present illness 78 yo female admitted with A fib, GI bleed. Hx of CVA, DM, A fib, shingles, COPD   OT comments  Pt so pleasant and agreeable however pt was mod A with transfer this day.  Communicated importance and family being with pt and HHOT to increase I to PLOF   Follow Up Recommendations  Home health OT;Supervision/Assistance - 24 hour    Equipment Recommendations  None recommended by OT    Recommendations for Other Services      Precautions / Restrictions Precautions Precautions: Fall Restrictions Weight Bearing Restrictions: No       Mobility Bed Mobility Overal bed mobility: Needs Assistance Bed Mobility: Supine to Sit     Supine to sit: Min guard     General bed mobility comments: increased time and effort. min guard for safety  Transfers Overall transfer level: Needs assistance Equipment used: Rolling walker (2 wheeled) Transfers: Sit to/from [facility] Sit to Stand: Min assist Stand pivot transfers: Min assist;Mod assist       General transfer comment: VC for hand placement    Balance Overall balance assessment: Needs assistance Sitting-balance support: No upper extremity supported Sitting balance-Leahy Scale: Good     Standing balance support: Bilateral upper extremity supported Standing balance-Leahy Scale: Poor                             ADL either performed or assessed with clinical judgement   ADL Overall ADL's : Needs assistance/impaired     Grooming: Wash/dry face;Oral care;Standing;Minimal assistance Grooming Details (indicate cue type and reason): standing at sink                 Toilet Transfer: Stand-pivot;Moderate assistance;BSC;Cueing for sequencing   Toileting- Clothing Manipulation and Hygiene: Sit to/from stand;Cueing for sequencing;Cueing for  safety;Moderate assistance         General ADL Comments: pt looking forward to returning home with daugther and husband     Vision Patient Visual Report: No change from baseline     Perception     Praxis      Cognition Arousal/Alertness: Awake/alert Behavior During Therapy: WFL for tasks assessed/performed Overall Cognitive Status: Within Functional Limits for tasks assessed                                 General Comments: pt a bit slow to respond at times but did follow all directions.  Pt has a history of CVA.  Per old notes pt with aphasia last hospital visit                   Pertinent Vitals/ Pain       Pain Assessment: No/denies pain     Prior Functioning/Environment              Frequency  Min 2X/week        Progress Toward Goals  OT Goals(current goals can now be found in the care plan section)  Progress towards OT goals: Progressing toward goals     Plan Discharge plan remains appropriate       AM-PAC OT "6 Clicks" Daily Activity     Outcome Measure   Help from another person eating meals?: None Help from another person taking care of  personal grooming?: None Help from another person toileting, which includes using toliet, bedpan, or urinal?: A Little Help from another person bathing (including washing, rinsing, drying)?: A Little Help from another person to put on and taking off regular upper body clothing?: A Little Help from another person to put on and taking off regular lower body clothing?: A Lot 6 Click Score: 19    End of Session Equipment Utilized During Treatment: Rolling walker;Gait belt  OT Visit Diagnosis: Unsteadiness on feet (R26.81);Other abnormalities of gait and mobility (R26.89);Muscle weakness (generalized) (M62.81)   Activity Tolerance Patient tolerated treatment well   Patient Left in chair;with call bell/phone within reach   Nurse Communication Mobility status        Time: [id number] OT  Time Calculation (min): 14 min  Charges: OT General Charges $OT Visit: 1 Visit OT Treatments $Self Care/Home Management : 8-22 mins  Kari Baars, [place] [phone] Office- [place], Edwena Felty D 04/22/2019, 1:23 PM

## 2019-04-23 NOTE — Anesthesia Postprocedure Evaluation (Signed)
Anesthesia Post Note  Patient: MARIKAY ROADS  Procedure(s) Performed: COLONOSCOPY WITH PROPOFOL (N/A ) ESOPHAGOGASTRODUODENOSCOPY (EGD) WITH PROPOFOL (N/A ) BIOPSY HOT HEMOSTASIS (ARGON PLASMA COAGULATION/BICAP) (N/A ) POLYPECTOMY     Patient location during evaluation: Endoscopy Anesthesia Type: MAC Level of consciousness: awake and alert Pain management: pain level controlled Vital Signs Assessment: post-procedure vital signs reviewed and stable Respiratory status: spontaneous breathing, nonlabored ventilation, respiratory function stable and patient connected to nasal cannula oxygen Cardiovascular status: blood pressure returned to baseline and stable Postop Assessment: no apparent nausea or vomiting Anesthetic complications: no    Last Vitals:  Vitals:   04/22/19 0354 04/22/19 0821  BP: 129/75   Pulse: 76   Resp: 18   Temp: 36.7 C   SpO2: 96% 96%    Last Pain:  Vitals:   04/22/19 1247  TempSrc:   PainSc: 2                  Landrie Beale L Seren Chaloux

## 2019-04-24 DIAGNOSIS — Z7901 Long term (current) use of anticoagulants: Secondary | ICD-10-CM | POA: Diagnosis not present

## 2019-04-24 DIAGNOSIS — Z79899 Other long term (current) drug therapy: Secondary | ICD-10-CM | POA: Diagnosis not present

## 2019-04-24 DIAGNOSIS — F329 Major depressive disorder, single episode, unspecified: Secondary | ICD-10-CM | POA: Diagnosis not present

## 2019-04-24 DIAGNOSIS — K264 Chronic or unspecified duodenal ulcer with hemorrhage: Secondary | ICD-10-CM | POA: Diagnosis not present

## 2019-04-24 DIAGNOSIS — R627 Adult failure to thrive: Secondary | ICD-10-CM | POA: Diagnosis not present

## 2019-04-24 DIAGNOSIS — I110 Hypertensive heart disease with heart failure: Secondary | ICD-10-CM | POA: Diagnosis not present

## 2019-04-24 DIAGNOSIS — J449 Chronic obstructive pulmonary disease, unspecified: Secondary | ICD-10-CM | POA: Diagnosis not present

## 2019-04-24 DIAGNOSIS — E785 Hyperlipidemia, unspecified: Secondary | ICD-10-CM | POA: Diagnosis not present

## 2019-04-24 DIAGNOSIS — I2510 Atherosclerotic heart disease of native coronary artery without angina pectoris: Secondary | ICD-10-CM | POA: Diagnosis not present

## 2019-04-24 DIAGNOSIS — I5022 Chronic systolic (congestive) heart failure: Secondary | ICD-10-CM | POA: Diagnosis not present

## 2019-04-24 DIAGNOSIS — Z8673 Personal history of transient ischemic attack (TIA), and cerebral infarction without residual deficits: Secondary | ICD-10-CM | POA: Diagnosis not present

## 2019-04-24 DIAGNOSIS — E1169 Type 2 diabetes mellitus with other specified complication: Secondary | ICD-10-CM | POA: Diagnosis not present

## 2019-04-24 DIAGNOSIS — K250 Acute gastric ulcer with hemorrhage: Secondary | ICD-10-CM | POA: Diagnosis not present

## 2019-04-24 DIAGNOSIS — I482 Chronic atrial fibrillation, unspecified: Secondary | ICD-10-CM | POA: Diagnosis not present

## 2019-04-24 LAB — URINE CULTURE: Culture: >=100000 — AB

## 2019-05-06 DIAGNOSIS — E538 Deficiency of other specified B group vitamins: Secondary | ICD-10-CM | POA: Diagnosis not present

## 2019-05-06 DIAGNOSIS — K270 Acute peptic ulcer, site unspecified, with hemorrhage: Secondary | ICD-10-CM | POA: Diagnosis not present

## 2019-05-06 DIAGNOSIS — Z6834 Body mass index (BMI) 34.0-34.9, adult: Secondary | ICD-10-CM | POA: Diagnosis not present

## 2019-05-06 DIAGNOSIS — Z79899 Other long term (current) drug therapy: Secondary | ICD-10-CM | POA: Diagnosis not present

## 2019-05-06 DIAGNOSIS — K922 Gastrointestinal hemorrhage, unspecified: Secondary | ICD-10-CM | POA: Diagnosis not present

## 2019-05-06 DIAGNOSIS — K264 Chronic or unspecified duodenal ulcer with hemorrhage: Secondary | ICD-10-CM | POA: Diagnosis not present

## 2019-05-06 DIAGNOSIS — K250 Acute gastric ulcer with hemorrhage: Secondary | ICD-10-CM | POA: Diagnosis not present

## 2019-05-06 DIAGNOSIS — I482 Chronic atrial fibrillation, unspecified: Secondary | ICD-10-CM | POA: Diagnosis not present

## 2019-05-06 DIAGNOSIS — J449 Chronic obstructive pulmonary disease, unspecified: Secondary | ICD-10-CM | POA: Diagnosis not present

## 2019-05-06 DIAGNOSIS — K260 Acute duodenal ulcer with hemorrhage: Secondary | ICD-10-CM | POA: Diagnosis not present

## 2019-05-06 DIAGNOSIS — B029 Zoster without complications: Secondary | ICD-10-CM | POA: Diagnosis not present

## 2019-05-06 DIAGNOSIS — D500 Iron deficiency anemia secondary to blood loss (chronic): Secondary | ICD-10-CM | POA: Diagnosis not present

## 2019-05-12 DIAGNOSIS — I110 Hypertensive heart disease with heart failure: Secondary | ICD-10-CM | POA: Diagnosis not present

## 2019-05-12 DIAGNOSIS — D649 Anemia, unspecified: Secondary | ICD-10-CM | POA: Diagnosis not present

## 2019-05-12 DIAGNOSIS — I509 Heart failure, unspecified: Secondary | ICD-10-CM | POA: Diagnosis not present

## 2019-05-12 DIAGNOSIS — J441 Chronic obstructive pulmonary disease with (acute) exacerbation: Secondary | ICD-10-CM | POA: Diagnosis not present

## 2019-05-12 DIAGNOSIS — R0602 Shortness of breath: Secondary | ICD-10-CM | POA: Diagnosis not present

## 2019-05-24 DIAGNOSIS — K264 Chronic or unspecified duodenal ulcer with hemorrhage: Secondary | ICD-10-CM | POA: Diagnosis not present

## 2019-05-24 DIAGNOSIS — I110 Hypertensive heart disease with heart failure: Secondary | ICD-10-CM | POA: Diagnosis not present

## 2019-05-24 DIAGNOSIS — R627 Adult failure to thrive: Secondary | ICD-10-CM | POA: Diagnosis not present

## 2019-05-24 DIAGNOSIS — J449 Chronic obstructive pulmonary disease, unspecified: Secondary | ICD-10-CM | POA: Diagnosis not present

## 2019-05-24 DIAGNOSIS — Z8673 Personal history of transient ischemic attack (TIA), and cerebral infarction without residual deficits: Secondary | ICD-10-CM | POA: Diagnosis not present

## 2019-05-24 DIAGNOSIS — Z7901 Long term (current) use of anticoagulants: Secondary | ICD-10-CM | POA: Diagnosis not present

## 2019-05-24 DIAGNOSIS — Z79899 Other long term (current) drug therapy: Secondary | ICD-10-CM | POA: Diagnosis not present

## 2019-05-24 DIAGNOSIS — I482 Chronic atrial fibrillation, unspecified: Secondary | ICD-10-CM | POA: Diagnosis not present

## 2019-05-24 DIAGNOSIS — I5022 Chronic systolic (congestive) heart failure: Secondary | ICD-10-CM | POA: Diagnosis not present

## 2019-05-24 DIAGNOSIS — E1169 Type 2 diabetes mellitus with other specified complication: Secondary | ICD-10-CM | POA: Diagnosis not present

## 2019-05-24 DIAGNOSIS — K250 Acute gastric ulcer with hemorrhage: Secondary | ICD-10-CM | POA: Diagnosis not present

## 2019-05-24 DIAGNOSIS — F329 Major depressive disorder, single episode, unspecified: Secondary | ICD-10-CM | POA: Diagnosis not present

## 2019-05-24 DIAGNOSIS — I2510 Atherosclerotic heart disease of native coronary artery without angina pectoris: Secondary | ICD-10-CM | POA: Diagnosis not present

## 2019-05-24 DIAGNOSIS — E785 Hyperlipidemia, unspecified: Secondary | ICD-10-CM | POA: Diagnosis not present

## 2019-05-27 ENCOUNTER — Ambulatory Visit: Payer: PPO | Admitting: Adult Health

## 2019-05-27 ENCOUNTER — Telehealth: Payer: Self-pay

## 2019-05-27 NOTE — Telephone Encounter (Signed)
Patient was a no call/no show for their appointment today.   

## 2019-06-01 DIAGNOSIS — D500 Iron deficiency anemia secondary to blood loss (chronic): Secondary | ICD-10-CM | POA: Diagnosis not present

## 2019-06-01 DIAGNOSIS — L03031 Cellulitis of right toe: Secondary | ICD-10-CM | POA: Diagnosis not present

## 2019-06-03 DIAGNOSIS — D500 Iron deficiency anemia secondary to blood loss (chronic): Secondary | ICD-10-CM | POA: Diagnosis not present

## 2019-06-03 DIAGNOSIS — K270 Acute peptic ulcer, site unspecified, with hemorrhage: Secondary | ICD-10-CM | POA: Diagnosis not present

## 2019-06-03 DIAGNOSIS — K260 Acute duodenal ulcer with hemorrhage: Secondary | ICD-10-CM | POA: Diagnosis not present

## 2019-06-29 DIAGNOSIS — J209 Acute bronchitis, unspecified: Secondary | ICD-10-CM | POA: Diagnosis not present

## 2019-06-29 DIAGNOSIS — J440 Chronic obstructive pulmonary disease with acute lower respiratory infection: Secondary | ICD-10-CM | POA: Diagnosis not present

## 2019-06-29 DIAGNOSIS — I429 Cardiomyopathy, unspecified: Secondary | ICD-10-CM | POA: Diagnosis not present

## 2019-07-05 DIAGNOSIS — R0602 Shortness of breath: Secondary | ICD-10-CM | POA: Diagnosis not present

## 2019-07-05 DIAGNOSIS — R05 Cough: Secondary | ICD-10-CM | POA: Diagnosis not present

## 2019-07-05 DIAGNOSIS — Z03818 Encounter for observation for suspected exposure to other biological agents ruled out: Secondary | ICD-10-CM | POA: Diagnosis not present

## 2019-07-05 DIAGNOSIS — J189 Pneumonia, unspecified organism: Secondary | ICD-10-CM | POA: Diagnosis not present

## 2019-07-06 DIAGNOSIS — F419 Anxiety disorder, unspecified: Secondary | ICD-10-CM | POA: Diagnosis not present

## 2019-07-06 DIAGNOSIS — I420 Dilated cardiomyopathy: Secondary | ICD-10-CM | POA: Diagnosis not present

## 2019-07-06 DIAGNOSIS — I2510 Atherosclerotic heart disease of native coronary artery without angina pectoris: Secondary | ICD-10-CM | POA: Diagnosis not present

## 2019-07-06 DIAGNOSIS — Z87891 Personal history of nicotine dependence: Secondary | ICD-10-CM | POA: Diagnosis not present

## 2019-07-06 DIAGNOSIS — I472 Ventricular tachycardia: Secondary | ICD-10-CM | POA: Diagnosis not present

## 2019-07-06 DIAGNOSIS — I10 Essential (primary) hypertension: Secondary | ICD-10-CM | POA: Diagnosis not present

## 2019-07-06 DIAGNOSIS — Z882 Allergy status to sulfonamides status: Secondary | ICD-10-CM | POA: Diagnosis not present

## 2019-07-06 DIAGNOSIS — I340 Nonrheumatic mitral (valve) insufficiency: Secondary | ICD-10-CM | POA: Diagnosis not present

## 2019-07-06 DIAGNOSIS — Z03818 Encounter for observation for suspected exposure to other biological agents ruled out: Secondary | ICD-10-CM | POA: Diagnosis not present

## 2019-07-06 DIAGNOSIS — K5900 Constipation, unspecified: Secondary | ICD-10-CM | POA: Diagnosis not present

## 2019-07-06 DIAGNOSIS — N390 Urinary tract infection, site not specified: Secondary | ICD-10-CM | POA: Diagnosis not present

## 2019-07-06 DIAGNOSIS — I482 Chronic atrial fibrillation, unspecified: Secondary | ICD-10-CM | POA: Diagnosis not present

## 2019-07-06 DIAGNOSIS — E7800 Pure hypercholesterolemia, unspecified: Secondary | ICD-10-CM | POA: Diagnosis not present

## 2019-07-06 DIAGNOSIS — N179 Acute kidney failure, unspecified: Secondary | ICD-10-CM | POA: Diagnosis not present

## 2019-07-06 DIAGNOSIS — Z905 Acquired absence of kidney: Secondary | ICD-10-CM | POA: Diagnosis not present

## 2019-07-06 DIAGNOSIS — Z7902 Long term (current) use of antithrombotics/antiplatelets: Secondary | ICD-10-CM | POA: Diagnosis not present

## 2019-07-06 DIAGNOSIS — I361 Nonrheumatic tricuspid (valve) insufficiency: Secondary | ICD-10-CM | POA: Diagnosis not present

## 2019-07-06 DIAGNOSIS — I252 Old myocardial infarction: Secondary | ICD-10-CM | POA: Diagnosis not present

## 2019-07-06 DIAGNOSIS — I5020 Unspecified systolic (congestive) heart failure: Secondary | ICD-10-CM | POA: Diagnosis not present

## 2019-07-06 DIAGNOSIS — J189 Pneumonia, unspecified organism: Secondary | ICD-10-CM | POA: Diagnosis not present

## 2019-07-06 DIAGNOSIS — E039 Hypothyroidism, unspecified: Secondary | ICD-10-CM | POA: Diagnosis not present

## 2019-07-06 DIAGNOSIS — J440 Chronic obstructive pulmonary disease with acute lower respiratory infection: Secondary | ICD-10-CM | POA: Diagnosis not present

## 2019-07-06 DIAGNOSIS — I5022 Chronic systolic (congestive) heart failure: Secondary | ICD-10-CM | POA: Diagnosis not present

## 2019-07-06 DIAGNOSIS — Z85528 Personal history of other malignant neoplasm of kidney: Secondary | ICD-10-CM | POA: Diagnosis not present

## 2019-07-06 DIAGNOSIS — Z8673 Personal history of transient ischemic attack (TIA), and cerebral infarction without residual deficits: Secondary | ICD-10-CM | POA: Diagnosis not present

## 2019-07-06 DIAGNOSIS — M1990 Unspecified osteoarthritis, unspecified site: Secondary | ICD-10-CM | POA: Diagnosis not present

## 2019-07-06 DIAGNOSIS — D72829 Elevated white blood cell count, unspecified: Secondary | ICD-10-CM | POA: Diagnosis not present

## 2019-07-06 DIAGNOSIS — I110 Hypertensive heart disease with heart failure: Secondary | ICD-10-CM | POA: Diagnosis not present

## 2019-07-06 DIAGNOSIS — R05 Cough: Secondary | ICD-10-CM | POA: Diagnosis not present

## 2019-07-06 DIAGNOSIS — Z79899 Other long term (current) drug therapy: Secondary | ICD-10-CM | POA: Diagnosis not present

## 2019-07-06 DIAGNOSIS — K219 Gastro-esophageal reflux disease without esophagitis: Secondary | ICD-10-CM | POA: Diagnosis not present

## 2019-07-06 DIAGNOSIS — R0602 Shortness of breath: Secondary | ICD-10-CM | POA: Diagnosis not present

## 2019-07-07 DIAGNOSIS — I482 Chronic atrial fibrillation, unspecified: Secondary | ICD-10-CM | POA: Diagnosis not present

## 2019-07-07 DIAGNOSIS — N390 Urinary tract infection, site not specified: Secondary | ICD-10-CM | POA: Diagnosis not present

## 2019-07-07 DIAGNOSIS — N179 Acute kidney failure, unspecified: Secondary | ICD-10-CM | POA: Diagnosis not present

## 2019-07-07 DIAGNOSIS — J189 Pneumonia, unspecified organism: Secondary | ICD-10-CM | POA: Diagnosis not present

## 2019-07-08 DIAGNOSIS — N179 Acute kidney failure, unspecified: Secondary | ICD-10-CM | POA: Diagnosis not present

## 2019-07-08 DIAGNOSIS — N390 Urinary tract infection, site not specified: Secondary | ICD-10-CM | POA: Diagnosis not present

## 2019-07-08 DIAGNOSIS — J189 Pneumonia, unspecified organism: Secondary | ICD-10-CM | POA: Diagnosis not present

## 2019-07-08 DIAGNOSIS — I482 Chronic atrial fibrillation, unspecified: Secondary | ICD-10-CM | POA: Diagnosis not present

## 2019-07-09 DIAGNOSIS — J189 Pneumonia, unspecified organism: Secondary | ICD-10-CM | POA: Diagnosis not present

## 2019-07-09 DIAGNOSIS — N179 Acute kidney failure, unspecified: Secondary | ICD-10-CM | POA: Diagnosis not present

## 2019-07-09 DIAGNOSIS — I482 Chronic atrial fibrillation, unspecified: Secondary | ICD-10-CM | POA: Diagnosis not present

## 2019-07-09 DIAGNOSIS — N390 Urinary tract infection, site not specified: Secondary | ICD-10-CM | POA: Diagnosis not present

## 2019-07-10 DIAGNOSIS — N179 Acute kidney failure, unspecified: Secondary | ICD-10-CM | POA: Diagnosis not present

## 2019-07-10 DIAGNOSIS — J189 Pneumonia, unspecified organism: Secondary | ICD-10-CM | POA: Diagnosis not present

## 2019-07-10 DIAGNOSIS — I482 Chronic atrial fibrillation, unspecified: Secondary | ICD-10-CM | POA: Diagnosis not present

## 2019-07-10 DIAGNOSIS — N390 Urinary tract infection, site not specified: Secondary | ICD-10-CM | POA: Diagnosis not present

## 2019-07-11 DIAGNOSIS — I2510 Atherosclerotic heart disease of native coronary artery without angina pectoris: Secondary | ICD-10-CM

## 2019-07-11 DIAGNOSIS — N179 Acute kidney failure, unspecified: Secondary | ICD-10-CM | POA: Diagnosis not present

## 2019-07-11 DIAGNOSIS — I340 Nonrheumatic mitral (valve) insufficiency: Secondary | ICD-10-CM

## 2019-07-11 DIAGNOSIS — J189 Pneumonia, unspecified organism: Secondary | ICD-10-CM

## 2019-07-11 DIAGNOSIS — I472 Ventricular tachycardia: Secondary | ICD-10-CM

## 2019-07-11 DIAGNOSIS — I482 Chronic atrial fibrillation, unspecified: Secondary | ICD-10-CM

## 2019-07-11 DIAGNOSIS — I361 Nonrheumatic tricuspid (valve) insufficiency: Secondary | ICD-10-CM

## 2019-07-11 DIAGNOSIS — N390 Urinary tract infection, site not specified: Secondary | ICD-10-CM | POA: Diagnosis not present

## 2019-07-11 DIAGNOSIS — I5022 Chronic systolic (congestive) heart failure: Secondary | ICD-10-CM

## 2019-07-12 DIAGNOSIS — I482 Chronic atrial fibrillation, unspecified: Secondary | ICD-10-CM | POA: Diagnosis not present

## 2019-07-12 DIAGNOSIS — J189 Pneumonia, unspecified organism: Secondary | ICD-10-CM | POA: Diagnosis not present

## 2019-07-12 DIAGNOSIS — N179 Acute kidney failure, unspecified: Secondary | ICD-10-CM | POA: Diagnosis not present

## 2019-07-12 DIAGNOSIS — N390 Urinary tract infection, site not specified: Secondary | ICD-10-CM | POA: Diagnosis not present

## 2019-07-13 DIAGNOSIS — N179 Acute kidney failure, unspecified: Secondary | ICD-10-CM | POA: Diagnosis not present

## 2019-07-13 DIAGNOSIS — N390 Urinary tract infection, site not specified: Secondary | ICD-10-CM | POA: Diagnosis not present

## 2019-07-13 DIAGNOSIS — I482 Chronic atrial fibrillation, unspecified: Secondary | ICD-10-CM | POA: Diagnosis not present

## 2019-07-13 DIAGNOSIS — J189 Pneumonia, unspecified organism: Secondary | ICD-10-CM | POA: Diagnosis not present

## 2019-07-14 DIAGNOSIS — N390 Urinary tract infection, site not specified: Secondary | ICD-10-CM | POA: Diagnosis not present

## 2019-07-14 DIAGNOSIS — J189 Pneumonia, unspecified organism: Secondary | ICD-10-CM | POA: Diagnosis not present

## 2019-07-14 DIAGNOSIS — I482 Chronic atrial fibrillation, unspecified: Secondary | ICD-10-CM | POA: Diagnosis not present

## 2019-07-14 DIAGNOSIS — N179 Acute kidney failure, unspecified: Secondary | ICD-10-CM | POA: Diagnosis not present

## 2019-07-15 DIAGNOSIS — N390 Urinary tract infection, site not specified: Secondary | ICD-10-CM | POA: Diagnosis not present

## 2019-07-15 DIAGNOSIS — N179 Acute kidney failure, unspecified: Secondary | ICD-10-CM | POA: Diagnosis not present

## 2019-07-15 DIAGNOSIS — I10 Essential (primary) hypertension: Secondary | ICD-10-CM

## 2019-07-15 DIAGNOSIS — I482 Chronic atrial fibrillation, unspecified: Secondary | ICD-10-CM

## 2019-07-15 DIAGNOSIS — I5020 Unspecified systolic (congestive) heart failure: Secondary | ICD-10-CM

## 2019-07-15 DIAGNOSIS — I340 Nonrheumatic mitral (valve) insufficiency: Secondary | ICD-10-CM

## 2019-07-15 DIAGNOSIS — I472 Ventricular tachycardia: Secondary | ICD-10-CM

## 2019-07-15 DIAGNOSIS — J189 Pneumonia, unspecified organism: Secondary | ICD-10-CM | POA: Diagnosis not present

## 2019-07-15 DIAGNOSIS — I2510 Atherosclerotic heart disease of native coronary artery without angina pectoris: Secondary | ICD-10-CM

## 2019-07-16 DIAGNOSIS — N179 Acute kidney failure, unspecified: Secondary | ICD-10-CM | POA: Diagnosis not present

## 2019-07-16 DIAGNOSIS — I482 Chronic atrial fibrillation, unspecified: Secondary | ICD-10-CM | POA: Diagnosis not present

## 2019-07-16 DIAGNOSIS — N390 Urinary tract infection, site not specified: Secondary | ICD-10-CM | POA: Diagnosis not present

## 2019-07-16 DIAGNOSIS — J189 Pneumonia, unspecified organism: Secondary | ICD-10-CM | POA: Diagnosis not present

## 2019-07-17 DIAGNOSIS — J189 Pneumonia, unspecified organism: Secondary | ICD-10-CM | POA: Diagnosis not present

## 2019-07-17 DIAGNOSIS — N390 Urinary tract infection, site not specified: Secondary | ICD-10-CM | POA: Diagnosis not present

## 2019-07-17 DIAGNOSIS — N179 Acute kidney failure, unspecified: Secondary | ICD-10-CM | POA: Diagnosis not present

## 2019-07-17 DIAGNOSIS — I482 Chronic atrial fibrillation, unspecified: Secondary | ICD-10-CM | POA: Diagnosis not present

## 2019-07-18 DIAGNOSIS — J189 Pneumonia, unspecified organism: Secondary | ICD-10-CM | POA: Diagnosis not present

## 2019-07-18 DIAGNOSIS — N179 Acute kidney failure, unspecified: Secondary | ICD-10-CM | POA: Diagnosis not present

## 2019-07-18 DIAGNOSIS — I482 Chronic atrial fibrillation, unspecified: Secondary | ICD-10-CM | POA: Diagnosis not present

## 2019-07-18 DIAGNOSIS — N390 Urinary tract infection, site not specified: Secondary | ICD-10-CM | POA: Diagnosis not present

## 2019-07-20 DIAGNOSIS — F418 Other specified anxiety disorders: Secondary | ICD-10-CM | POA: Diagnosis not present

## 2019-07-20 DIAGNOSIS — K219 Gastro-esophageal reflux disease without esophagitis: Secondary | ICD-10-CM | POA: Diagnosis not present

## 2019-07-20 DIAGNOSIS — I5022 Chronic systolic (congestive) heart failure: Secondary | ICD-10-CM | POA: Diagnosis not present

## 2019-07-20 DIAGNOSIS — I252 Old myocardial infarction: Secondary | ICD-10-CM | POA: Diagnosis not present

## 2019-07-20 DIAGNOSIS — R2681 Unsteadiness on feet: Secondary | ICD-10-CM | POA: Diagnosis not present

## 2019-07-20 DIAGNOSIS — K5900 Constipation, unspecified: Secondary | ICD-10-CM | POA: Diagnosis not present

## 2019-07-20 DIAGNOSIS — M1990 Unspecified osteoarthritis, unspecified site: Secondary | ICD-10-CM | POA: Diagnosis not present

## 2019-07-20 DIAGNOSIS — J440 Chronic obstructive pulmonary disease with acute lower respiratory infection: Secondary | ICD-10-CM | POA: Diagnosis not present

## 2019-07-20 DIAGNOSIS — J189 Pneumonia, unspecified organism: Secondary | ICD-10-CM | POA: Diagnosis not present

## 2019-07-20 DIAGNOSIS — Z85528 Personal history of other malignant neoplasm of kidney: Secondary | ICD-10-CM | POA: Diagnosis not present

## 2019-07-20 DIAGNOSIS — Z7902 Long term (current) use of antithrombotics/antiplatelets: Secondary | ICD-10-CM | POA: Diagnosis not present

## 2019-07-20 DIAGNOSIS — I110 Hypertensive heart disease with heart failure: Secondary | ICD-10-CM | POA: Diagnosis not present

## 2019-07-20 DIAGNOSIS — R32 Unspecified urinary incontinence: Secondary | ICD-10-CM | POA: Diagnosis not present

## 2019-07-20 DIAGNOSIS — I429 Cardiomyopathy, unspecified: Secondary | ICD-10-CM | POA: Diagnosis not present

## 2019-07-20 DIAGNOSIS — E039 Hypothyroidism, unspecified: Secondary | ICD-10-CM | POA: Diagnosis not present

## 2019-07-20 DIAGNOSIS — I2510 Atherosclerotic heart disease of native coronary artery without angina pectoris: Secondary | ICD-10-CM | POA: Diagnosis not present

## 2019-07-20 DIAGNOSIS — Z79899 Other long term (current) drug therapy: Secondary | ICD-10-CM | POA: Diagnosis not present

## 2019-07-20 DIAGNOSIS — Z7901 Long term (current) use of anticoagulants: Secondary | ICD-10-CM | POA: Diagnosis not present

## 2019-07-20 DIAGNOSIS — I482 Chronic atrial fibrillation, unspecified: Secondary | ICD-10-CM | POA: Diagnosis not present

## 2019-07-20 DIAGNOSIS — R5383 Other fatigue: Secondary | ICD-10-CM | POA: Diagnosis not present

## 2019-07-20 DIAGNOSIS — I340 Nonrheumatic mitral (valve) insufficiency: Secondary | ICD-10-CM | POA: Diagnosis not present

## 2019-07-20 DIAGNOSIS — Z87891 Personal history of nicotine dependence: Secondary | ICD-10-CM | POA: Diagnosis not present

## 2019-07-20 DIAGNOSIS — R531 Weakness: Secondary | ICD-10-CM | POA: Diagnosis not present

## 2019-07-20 DIAGNOSIS — E7800 Pure hypercholesterolemia, unspecified: Secondary | ICD-10-CM | POA: Diagnosis not present

## 2019-07-20 DIAGNOSIS — Z8673 Personal history of transient ischemic attack (TIA), and cerebral infarction without residual deficits: Secondary | ICD-10-CM | POA: Diagnosis not present

## 2019-07-20 DIAGNOSIS — D500 Iron deficiency anemia secondary to blood loss (chronic): Secondary | ICD-10-CM | POA: Diagnosis not present

## 2019-07-22 ENCOUNTER — Ambulatory Visit: Payer: PPO | Admitting: Cardiology

## 2019-07-27 ENCOUNTER — Encounter: Payer: Self-pay | Admitting: Cardiology

## 2019-07-27 ENCOUNTER — Other Ambulatory Visit: Payer: Self-pay

## 2019-07-27 ENCOUNTER — Telehealth (INDEPENDENT_AMBULATORY_CARE_PROVIDER_SITE_OTHER): Payer: PPO | Admitting: Cardiology

## 2019-07-27 VITALS — Ht 64.0 in | Wt 188.0 lb

## 2019-07-27 DIAGNOSIS — I482 Chronic atrial fibrillation, unspecified: Secondary | ICD-10-CM

## 2019-07-27 DIAGNOSIS — Z1329 Encounter for screening for other suspected endocrine disorder: Secondary | ICD-10-CM

## 2019-07-27 DIAGNOSIS — I10 Essential (primary) hypertension: Secondary | ICD-10-CM

## 2019-07-27 DIAGNOSIS — E088 Diabetes mellitus due to underlying condition with unspecified complications: Secondary | ICD-10-CM | POA: Diagnosis not present

## 2019-07-27 DIAGNOSIS — J449 Chronic obstructive pulmonary disease, unspecified: Secondary | ICD-10-CM | POA: Diagnosis not present

## 2019-07-27 DIAGNOSIS — I2510 Atherosclerotic heart disease of native coronary artery without angina pectoris: Secondary | ICD-10-CM | POA: Diagnosis not present

## 2019-07-27 DIAGNOSIS — I420 Dilated cardiomyopathy: Secondary | ICD-10-CM

## 2019-07-27 DIAGNOSIS — I5022 Chronic systolic (congestive) heart failure: Secondary | ICD-10-CM | POA: Diagnosis not present

## 2019-07-27 NOTE — Patient Instructions (Signed)
Medication Instructions:  Your physician recommends that you continue on your current medications as directed. Please refer to the Current Medication list given to you today.   *If you need a refill on your cardiac medications before your next appointment, please call your pharmacy*  Lab Work: Your physician recommends that you have a CBC, TSH, hepatic, lipid and BMP drawn  If you have labs (blood work) drawn today and your tests are completely normal, you will receive your results only by: Marland Kitchen MyChart Message (if you have MyChart) OR . A paper copy in the mail If you have any lab test that is abnormal or we need to change your treatment, we will call you to review the results.  Testing/Procedures: Your physician has requested that you have an echocardiogram. Echocardiography is a painless test that uses sound waves to create images of your heart. It provides your doctor with information about the size and shape of your heart and how well your heart's chambers and valves are working. This procedure takes approximately one hour. There are no restrictions for this procedure.    Follow-Up: At [facility], you and your health needs are our priority.  As part of our continuing mission to provide you with exceptional heart care, we have created designated Provider Care Teams.  These Care Teams include your primary Cardiologist (physician) and Advanced Practice Providers (APPs -  Physician Assistants and Nurse Practitioners) who all work together to provide you with the care you need, when you need it.  Your next appointment:   2 month(s)  The format for your next appointment:   In Person  Provider:   Jyl Heinz, MD  Other Instructions  Echocardiogram An echocardiogram is a procedure that uses painless sound waves (ultrasound) to produce an image of the heart. Images from an echocardiogram can provide important information about:  Signs of coronary artery disease (CAD).  Aneurysm  detection. An aneurysm is a weak or damaged part of an artery wall that bulges out from the normal force of blood pumping through the body.  Heart size and shape. Changes in the size or shape of the heart can be associated with certain conditions, including heart failure, aneurysm, and CAD.  Heart muscle function.  Heart valve function.  Signs of a past heart attack.  Fluid buildup around the heart.  Thickening of the heart muscle.  A tumor or infectious growth around the heart valves. Tell a health care provider about:  Any allergies you have.  All medicines you are taking, including vitamins, herbs, eye drops, creams, and over-the-counter medicines.  Any blood disorders you have.  Any surgeries you have had.  Any medical conditions you have.  Whether you are pregnant or may be pregnant. What are the risks? Generally, this is a safe procedure. However, problems may occur, including:  Allergic reaction to dye (contrast) that may be used during the procedure. What happens before the procedure? No specific preparation is needed. You may eat and drink normally. What happens during the procedure?   An IV tube may be inserted into one of your veins.  You may receive contrast through this tube. A contrast is an injection that improves the quality of the pictures from your heart.  A gel will be applied to your chest.  A wand-like tool (transducer) will be moved over your chest. The gel will help to transmit the sound waves from the transducer.  The sound waves will harmlessly bounce off of your heart to allow the  heart images to be captured in real-time motion. The images will be recorded on a computer. The procedure may vary among health care providers and hospitals. What happens after the procedure?  You may return to your normal, everyday life, including diet, activities, and medicines, unless your health care provider tells you not to do that. Summary  An  echocardiogram is a procedure that uses painless sound waves (ultrasound) to produce an image of the heart.  Images from an echocardiogram can provide important information about the size and shape of your heart, heart muscle function, heart valve function, and fluid buildup around your heart.  You do not need to do anything to prepare before this procedure. You may eat and drink normally.  After the echocardiogram is completed, you may return to your normal, everyday life, unless your health care provider tells you not to do that. This information is not intended to replace advice given to you by your health care provider. Make sure you discuss any questions you have with your health care provider. Document Released: 08/02/2000 Document Revised: 11/26/2018 Document Reviewed: 09/07/2016 Elsevier Patient Education  2020 [employer].

## 2019-07-27 NOTE — Progress Notes (Signed)
Virtual Visit via Video Note   This visit type was conducted due to national recommendations for restrictions regarding the COVID-19 Pandemic (e.g. social distancing) in an effort to limit this patient's exposure and mitigate transmission in our community.  Due to her co-morbid illnesses, this patient is at least at moderate risk for complications without adequate follow up.  This format is felt to be most appropriate for this patient at this time.  All issues noted in this document were discussed and addressed.  A limited physical exam was performed with this format.  Please refer to the patient's chart for her consent to telehealth for [facility].   Date:  07/27/2019   ID:  Kaitlyn Underwood, DOB [birth year], MRN [id number]  Patient Location: Home Provider Location: Office  PCP:  Lowella Dandy, NP  Cardiologist:  No primary care provider on file.  Electrophysiologist:  None   Evaluation Performed:  Follow-Up Visit  Chief Complaint: Congestive heart failure and cardiomyopathy  History of Present Illness:    Kaitlyn Underwood is a 78 y.o. female with history of atrial fibrillation, congestive heart failure and cardiomyopathy.  She was at [facility] at [facility].  She was treated for this and released.  Subsequently she has felt fine.  No chest pain orthopnea or PND.  Her family member is at her side on video conference and supportive.  Patient mentions to me that she is feeling much better at this time.  At the time of my evaluation, the patient is alert awake oriented and in no distress.  The patient does not have symptoms concerning for COVID-19 infection (fever, chills, cough, or new shortness of breath).    Past Medical History:  Diagnosis Date   Anxiety    Atrial fibrillation (HCC)    Chronic systolic heart failure (HCC)    COPD (chronic obstructive pulmonary disease) ([place])    Coronary artery disease    Hypertension    Hypothyroidism    Left renal mass    unconfirmed documentation of "kidney cancer", possibly cyst   Pre-diabetes    Stroke [facility])    Past Surgical History:  Procedure Laterality Date   BIOPSY  04/20/2019   Procedure: BIOPSY;  Surgeon: Carol Ada, MD;  [facility];  Service: Endoscopy;;   COLONOSCOPY WITH PROPOFOL N/A 04/20/2019   Procedure: COLONOSCOPY WITH PROPOFOL;  Surgeon: Carol Ada, MD;  [facility];  Service: Endoscopy;  Laterality: N/A;   ESOPHAGOGASTRODUODENOSCOPY (EGD) WITH PROPOFOL N/A 04/20/2019   Procedure: ESOPHAGOGASTRODUODENOSCOPY (EGD) WITH PROPOFOL;  Surgeon: Carol Ada, MD;  [facility];  Service: Endoscopy;  Laterality: N/A;   HOT HEMOSTASIS N/A 04/20/2019   Procedure: HOT HEMOSTASIS (ARGON PLASMA COAGULATION/BICAP);  Surgeon: Carol Ada, MD;  Location[facility];  Service: Endoscopy;  Laterality: N/A;   IR CT HEAD LTD  07/06/2018   IR PERCUTANEOUS ART THROMBECTOMY/INFUSION INTRACRANIAL INC DIAG ANGIO  07/06/2018   LEFT HEART CATH AND CORONARY ANGIOGRAPHY N/A 04/29/2017   Procedure: LEFT HEART CATH AND CORONARY ANGIOGRAPHY;  Surgeon: Belva Crome, MD;  Location: [place] CV LAB;  Service: Cardiovascular;  Laterality: N/A;   POLYPECTOMY  04/20/2019   Procedure: POLYPECTOMY;  Surgeon: Carol Ada, MD;  [facility];  Service: Endoscopy;;   RADIOLOGY WITH ANESTHESIA N/A 07/05/2018   Procedure: RADIOLOGY WITH ANESTHESIA;  Surgeon: Luanne Bras, MD;  Location: [place];  Service: Radiology;  Laterality: N/A;   ULTRASOUND GUIDANCE FOR VASCULAR ACCESS  04/29/2017   Procedure: Ultrasound Guidance  For Vascular Access;  Surgeon: Belva Crome, MD;  Location: [place] [facility];  Service: Cardiovascular;;     Current Meds  Medication Sig   acetaminophen (TYLENOL) 325 MG tablet Take 2 tablets (650 mg total) by mouth every 4 (four) hours as needed for mild pain (or temp > 37.5 C (99.5 F)).   albuterol (PROVENTIL HFA;VENTOLIN HFA) 108 (90 Base)  MCG/ACT inhaler Inhale 2 puffs into the lungs every 6 (six) hours as needed for wheezing or shortness of breath.   amiodarone (PACERONE) 200 MG tablet Take 200 mg by mouth 2 (two) times daily.   apixaban (ELIQUIS) 5 MG TABS tablet Take 1 tablet (5 mg total) by mouth 2 (two) times daily.   budesonide (PULMICORT) 0.5 MG/2ML nebulizer solution Take 0.5 mg by nebulization daily.    carvedilol (COREG) 6.25 MG tablet Take 6.25 mg by mouth 2 (two) times daily with a meal.   furosemide (LASIX) 40 MG tablet Take 40 mg by mouth daily.   gabapentin (NEURONTIN) 300 MG capsule Take 300 mg by mouth 3 (three) times daily.   ipratropium-albuterol (DUONEB) 0.5-2.5 (3) MG/3ML SOLN Take 3 mLs by nebulization every 6 (six) hours as needed (shortness of breath/wheezing.).    montelukast (SINGULAIR) 10 MG tablet Take 1 tablet (10 mg total) by mouth daily.   Multiple Vitamin (MULTIVITAMIN WITH MINERALS) TABS tablet Take 1 tablet by mouth daily.   nitroGLYCERIN (NITROSTAT) 0.4 MG SL tablet Place 0.4 mg under the tongue every 5 (five) minutes as needed for chest pain.    pantoprazole (PROTONIX) 40 MG tablet Take 1 tablet (40 mg total) by mouth 2 (two) times daily.   pravastatin (PRAVACHOL) 20 MG tablet Take 1 tablet (20 mg total) by mouth daily at 6 PM.   sacubitril-valsartan (ENTRESTO) 24-26 MG Take 1 tablet by mouth 2 (two) times daily.   sertraline (ZOLOFT) 25 MG tablet Take 1 tablet (25 mg total) by mouth daily. (Patient taking differently: Take 50 mg by mouth daily. )   traMADol (ULTRAM) 50 MG tablet Take 50 mg by mouth 3 (three) times daily.     Allergies:   Sulfa antibiotics   Social History   Tobacco Use   Smoking status: Former Smoker    Packs/day: 1.00    Years: 20.00    Pack years: 20.00    Types: Cigarettes    Quit date: 06/19/1990    Years since quitting: 29.1   Smokeless tobacco: Never Used  Substance Use Topics   Alcohol use: No    Alcohol/week: 0.0 standard drinks   Drug  use: No     Family Hx: The patient's family history includes Arrhythmia in her sister; Heart attack in her maternal uncle; Heart disease in her brother.  ROS:   Please see the history of present illness.    As mentioned above All other systems reviewed and are negative.   Prior CV studies:   The following studies were reviewed today:  Echocardiogram at [facility] revealed ejection fraction of 20 to 25%.  Labs/Other Tests and Data Reviewed:    EKG:  EKG done at [facility] revealed atrial fibrillation with well-controlled ventricular rate.  Recent Labs: 04/19/2019: B Natriuretic Peptide 544.3 04/22/2019: BUN 17; Creatinine, Ser 0.87; Hemoglobin 8.9; Magnesium 2.0; Platelets 504; Potassium 3.6; Sodium 137   Recent Lipid Panel Lab Results  Component Value Date/Time   CHOL 146 07/06/2018 07:04 AM   CHOL 189 05/12/2018 12:12 PM   TRIG 124 07/06/2018 07:04 AM  HDL 49 07/06/2018 07:04 AM   HDL 63 05/12/2018 12:12 PM   CHOLHDL 3.0 07/06/2018 07:04 AM   LDLCALC 72 07/06/2018 07:04 AM   LDLCALC 103 (H) 05/12/2018 12:12 PM    Wt Readings from Last 3 Encounters:  07/27/19 188 lb (85.3 kg)  04/19/19 192 lb 14.4 oz (87.5 kg)  09/22/18 180 lb (81.6 kg)     Objective:    Vital Signs:  Ht 5\' 4"  (1.626 m)    Wt 188 lb (85.3 kg)    BMI 32.27 kg/m    VITAL SIGNS:  reviewed  ASSESSMENT & PLAN:    1. Advanced cardiomyopathy and congestive heart failure: Patient is very frail and weak and has advanced comorbidities.  She is getting rehab at this time and feeling better.  She is on appropriate medications.  She needs blood work and the family member tells me that she would get her blood work when the nurse visits her.  She will also keep a track of pulse blood pressure and send it to [place].  Diet and regular weight was also discussed and she vocalized understanding.  She weighs herself on a regular basis and home health nurse visits her.  Patient appears very frail.  She is on  appropriate medications including Entresto and we will do a Chem-7. 2. Essential hypertension: Blood pressure is stable 3. Atrial fibrillation:I discussed with the patient atrial fibrillation, disease process. Management and therapy including rate and rhythm control, anticoagulation benefits and potential risks were discussed extensively with the patient. Patient had multiple questions which were answered to patient's satisfaction. 4. Patient will have echocardiogram to assess follow-up systolic function and also cardiac anatomy. 5. Patient will be seen in follow-up appointment in 2 months or earlier if the patient has any concerns   COVID-19 Education: The signs and symptoms of COVID-19 were discussed with the patient and how to seek care for testing (follow up with PCP or arrange E-visit).  The importance of social distancing was discussed today.  Time:   Today, I have spent 25 minutes with the patient with telehealth technology discussing the above problems.  Multiple complex issues including hospital records were reviewed and patient was advised about this extensively.   Medication Adjustments/Labs and Tests Ordered: Current medicines are reviewed at length with the patient today.  Concerns regarding medicines are outlined above.   Tests Ordered: No orders of the defined types were placed in this encounter.   Medication Changes: No orders of the defined types were placed in this encounter.   Follow Up:  Either In Person or Virtual in 2 month(s)  Signed, Jenean Lindau, MD  07/27/2019 3:41 PM    [place]

## 2019-07-28 ENCOUNTER — Telehealth: Payer: Self-pay | Admitting: Cardiology

## 2019-07-28 NOTE — Telephone Encounter (Signed)
Patients daughter states she has vitals to give you

## 2019-07-29 ENCOUNTER — Telehealth: Payer: Self-pay | Admitting: Cardiology

## 2019-07-29 NOTE — Telephone Encounter (Signed)
Called and spoke to patients daughter. Informed her per Dr. Geraldo Pitter patient can not take life vest off as this is life-threatening. She verbally uderstood no further questions.

## 2019-07-29 NOTE — Telephone Encounter (Signed)
Patient called to speak to nurse again. Please call regarding vitals

## 2019-07-29 NOTE — Telephone Encounter (Signed)
Called home health rn and let her know which labs the patient needs asked her to fax results to our office. She Verbally understood.

## 2019-07-29 NOTE — Telephone Encounter (Signed)
Please make sure that the patient knows that she cannot take the LifeVest off and it can be life-threatening.  Also make sure that the lab work is done and that the visiting nurse exactly knows which blood work to be ordered.  Thank you

## 2019-07-29 NOTE — Telephone Encounter (Signed)
Daughter states pt was told to monitor vitals and call back with these numbers.  Home Health nurse there yesterday and vitals were as follows: BP 108/64, HR 70, O2 97%, Temp 97.6.    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  She wanted to make sure Dr. Geraldo Pitter knew.  Pt is wanting to take it off.  Advised pt will need to have echo before this can be taken off and may still need to wear after that.  She also mentioned that labs were ordered at time of visit.  Home Health RN could not draw yesterday because she did not have the orders.  Daughter states orders need to be given to [place], RN with [facility] [phone].  Advised I will send info over to Dr. Geraldo Pitter and his nurse and we will f/u once we hear back.

## 2019-08-03 DIAGNOSIS — I110 Hypertensive heart disease with heart failure: Secondary | ICD-10-CM | POA: Diagnosis not present

## 2019-08-03 DIAGNOSIS — J189 Pneumonia, unspecified organism: Secondary | ICD-10-CM | POA: Diagnosis not present

## 2019-08-03 DIAGNOSIS — I482 Chronic atrial fibrillation, unspecified: Secondary | ICD-10-CM | POA: Diagnosis not present

## 2019-08-06 DIAGNOSIS — J440 Chronic obstructive pulmonary disease with acute lower respiratory infection: Secondary | ICD-10-CM | POA: Diagnosis not present

## 2019-08-17 ENCOUNTER — Other Ambulatory Visit: Payer: Self-pay | Admitting: Cardiology

## 2019-08-17 DIAGNOSIS — I420 Dilated cardiomyopathy: Secondary | ICD-10-CM | POA: Diagnosis not present

## 2019-08-17 NOTE — Telephone Encounter (Signed)
Attempted to call patient to confirm medications since her chart reads that she also takes catopril ..which is a contraindication for entresto. Number was busy, will continue efforts.

## 2019-08-17 NOTE — Telephone Encounter (Signed)
New Message   *STAT* If patient is at the pharmacy, call can be transferred to refill team.   1. Which medications need to be refilled? (please list name of each medication and dose if known)   carvedilol (COREG) 6.25 MG tablet  sacubitril-valsartan (ENTRESTO) 24-26 MG  2. Which pharmacy/location (including street and city if local pharmacy) is medication to be sent to? [place], [place]  3. Do they need a 30 day or 90 day supply? [place]

## 2019-08-18 ENCOUNTER — Other Ambulatory Visit: Payer: Self-pay

## 2019-08-18 MED ORDER — CARVEDILOL 6.25 MG PO TABS: 6.2500 mg | ORAL_TABLET | Freq: Two times a day (BID) | ORAL | 0 refills | Status: AC

## 2019-08-18 MED ORDER — SACUBITRIL-VALSARTAN 24-26 MG PO TABS: 1.0000 | ORAL_TABLET | Freq: Two times a day (BID) | ORAL | 0 refills | Status: AC

## 2019-08-19 ENCOUNTER — Telehealth: Payer: Self-pay | Admitting: Cardiology

## 2019-08-19 DIAGNOSIS — I482 Chronic atrial fibrillation, unspecified: Secondary | ICD-10-CM | POA: Diagnosis not present

## 2019-08-19 DIAGNOSIS — I340 Nonrheumatic mitral (valve) insufficiency: Secondary | ICD-10-CM | POA: Diagnosis not present

## 2019-08-19 DIAGNOSIS — M1990 Unspecified osteoarthritis, unspecified site: Secondary | ICD-10-CM | POA: Diagnosis not present

## 2019-08-19 DIAGNOSIS — K5900 Constipation, unspecified: Secondary | ICD-10-CM | POA: Diagnosis not present

## 2019-08-19 DIAGNOSIS — R2681 Unsteadiness on feet: Secondary | ICD-10-CM | POA: Diagnosis not present

## 2019-08-19 DIAGNOSIS — Z7902 Long term (current) use of antithrombotics/antiplatelets: Secondary | ICD-10-CM | POA: Diagnosis not present

## 2019-08-19 DIAGNOSIS — F320 Major depressive disorder, single episode, mild: Secondary | ICD-10-CM | POA: Diagnosis not present

## 2019-08-19 DIAGNOSIS — R32 Unspecified urinary incontinence: Secondary | ICD-10-CM | POA: Diagnosis not present

## 2019-08-19 DIAGNOSIS — I110 Hypertensive heart disease with heart failure: Secondary | ICD-10-CM | POA: Diagnosis not present

## 2019-08-19 DIAGNOSIS — Z8673 Personal history of transient ischemic attack (TIA), and cerebral infarction without residual deficits: Secondary | ICD-10-CM | POA: Diagnosis not present

## 2019-08-19 DIAGNOSIS — Z7901 Long term (current) use of anticoagulants: Secondary | ICD-10-CM | POA: Diagnosis not present

## 2019-08-19 DIAGNOSIS — J449 Chronic obstructive pulmonary disease, unspecified: Secondary | ICD-10-CM | POA: Diagnosis not present

## 2019-08-19 DIAGNOSIS — R5383 Other fatigue: Secondary | ICD-10-CM | POA: Diagnosis not present

## 2019-08-19 DIAGNOSIS — I429 Cardiomyopathy, unspecified: Secondary | ICD-10-CM | POA: Diagnosis not present

## 2019-08-19 DIAGNOSIS — Z79899 Other long term (current) drug therapy: Secondary | ICD-10-CM | POA: Diagnosis not present

## 2019-08-19 DIAGNOSIS — Z85528 Personal history of other malignant neoplasm of kidney: Secondary | ICD-10-CM | POA: Diagnosis not present

## 2019-08-19 DIAGNOSIS — F418 Other specified anxiety disorders: Secondary | ICD-10-CM | POA: Diagnosis not present

## 2019-08-19 DIAGNOSIS — J440 Chronic obstructive pulmonary disease with acute lower respiratory infection: Secondary | ICD-10-CM | POA: Diagnosis not present

## 2019-08-19 DIAGNOSIS — Z87891 Personal history of nicotine dependence: Secondary | ICD-10-CM | POA: Diagnosis not present

## 2019-08-19 DIAGNOSIS — E7800 Pure hypercholesterolemia, unspecified: Secondary | ICD-10-CM | POA: Diagnosis not present

## 2019-08-19 DIAGNOSIS — I252 Old myocardial infarction: Secondary | ICD-10-CM | POA: Diagnosis not present

## 2019-08-19 DIAGNOSIS — J189 Pneumonia, unspecified organism: Secondary | ICD-10-CM | POA: Diagnosis not present

## 2019-08-19 DIAGNOSIS — E039 Hypothyroidism, unspecified: Secondary | ICD-10-CM | POA: Diagnosis not present

## 2019-08-19 DIAGNOSIS — I2510 Atherosclerotic heart disease of native coronary artery without angina pectoris: Secondary | ICD-10-CM | POA: Diagnosis not present

## 2019-08-19 DIAGNOSIS — I5022 Chronic systolic (congestive) heart failure: Secondary | ICD-10-CM | POA: Diagnosis not present

## 2019-08-19 DIAGNOSIS — K219 Gastro-esophageal reflux disease without esophagitis: Secondary | ICD-10-CM | POA: Diagnosis not present

## 2019-08-19 DIAGNOSIS — R531 Weakness: Secondary | ICD-10-CM | POA: Diagnosis not present

## 2019-08-19 DIAGNOSIS — D500 Iron deficiency anemia secondary to blood loss (chronic): Secondary | ICD-10-CM | POA: Diagnosis not present

## 2019-08-19 DIAGNOSIS — S91101A Unspecified open wound of right great toe without damage to nail, initial encounter: Secondary | ICD-10-CM | POA: Diagnosis not present

## 2019-08-19 NOTE — Telephone Encounter (Signed)
Called patient daughter per DPR let her know that I left message for home health RN to sent results of labs to [place]. Also advised her that Bartolo Darter, RN was working on prior authorization. During the call the patient's pcp call her and reported that they were working on prior auth for entresto so we didn't need to. Bartolo Darter, RN informed. Samples of entresto have been put aside for patient that way she will not run out over weekend. No further questions.

## 2019-08-19 NOTE — Telephone Encounter (Signed)
Patient's daughter is requesting lab results from blood work. Please call.

## 2019-08-19 NOTE — Telephone Encounter (Signed)
Left message for home health RN to call back regarding getting a copy of lab results. Left fax number for her to send them to on her voicemail. Bartolo Darter, RN is working on prior authorization.

## 2019-08-19 NOTE — Telephone Encounter (Signed)
Patient's daughter also states that the sacubitril-valsartan (ENTRESTO) 24-26 MGsacubitril-valsartan (ENTRESTO) 24-26 MG requires a prior authorization.

## 2019-08-23 ENCOUNTER — Telehealth: Payer: Self-pay | Admitting: Cardiology

## 2019-08-23 NOTE — Telephone Encounter (Signed)
Please call regarding Kaitlyn Underwood prior auth, [phone] for pt. Ref#[id number]

## 2019-08-25 NOTE — Progress Notes (Signed)
[facility] - Rx #[id number] Need help? Call us at [phone]  Outcome  Additional Information Required  Available without authorization.  DrugEntresto 24-26MG  tablets  [employer] Medicare 4-Part NCPDP Electronic PA Form

## 2019-08-27 DIAGNOSIS — J449 Chronic obstructive pulmonary disease, unspecified: Secondary | ICD-10-CM | POA: Diagnosis not present

## 2019-09-01 DIAGNOSIS — Z7951 Long term (current) use of inhaled steroids: Secondary | ICD-10-CM | POA: Diagnosis not present

## 2019-09-01 DIAGNOSIS — R42 Dizziness and giddiness: Secondary | ICD-10-CM | POA: Diagnosis not present

## 2019-09-01 DIAGNOSIS — E119 Type 2 diabetes mellitus without complications: Secondary | ICD-10-CM | POA: Diagnosis not present

## 2019-09-01 DIAGNOSIS — R0789 Other chest pain: Secondary | ICD-10-CM | POA: Diagnosis not present

## 2019-09-01 DIAGNOSIS — R110 Nausea: Secondary | ICD-10-CM | POA: Diagnosis not present

## 2019-09-01 DIAGNOSIS — I4430 Unspecified atrioventricular block: Secondary | ICD-10-CM | POA: Diagnosis not present

## 2019-09-01 DIAGNOSIS — I4892 Unspecified atrial flutter: Secondary | ICD-10-CM | POA: Diagnosis not present

## 2019-09-01 DIAGNOSIS — N183 Chronic kidney disease, stage 3 unspecified: Secondary | ICD-10-CM | POA: Diagnosis not present

## 2019-09-01 DIAGNOSIS — R079 Chest pain, unspecified: Secondary | ICD-10-CM | POA: Diagnosis not present

## 2019-09-01 DIAGNOSIS — Z20822 Contact with and (suspected) exposure to covid-19: Secondary | ICD-10-CM | POA: Diagnosis not present

## 2019-09-01 DIAGNOSIS — E785 Hyperlipidemia, unspecified: Secondary | ICD-10-CM | POA: Diagnosis not present

## 2019-09-01 DIAGNOSIS — I272 Pulmonary hypertension, unspecified: Secondary | ICD-10-CM | POA: Diagnosis not present

## 2019-09-01 DIAGNOSIS — I4891 Unspecified atrial fibrillation: Secondary | ICD-10-CM | POA: Diagnosis not present

## 2019-09-01 DIAGNOSIS — I454 Nonspecific intraventricular block: Secondary | ICD-10-CM | POA: Diagnosis not present

## 2019-09-01 DIAGNOSIS — Z7982 Long term (current) use of aspirin: Secondary | ICD-10-CM | POA: Diagnosis not present

## 2019-09-01 DIAGNOSIS — I480 Paroxysmal atrial fibrillation: Secondary | ICD-10-CM | POA: Diagnosis not present

## 2019-09-01 DIAGNOSIS — E1122 Type 2 diabetes mellitus with diabetic chronic kidney disease: Secondary | ICD-10-CM | POA: Diagnosis not present

## 2019-09-01 DIAGNOSIS — R072 Precordial pain: Secondary | ICD-10-CM | POA: Diagnosis not present

## 2019-09-01 DIAGNOSIS — K254 Chronic or unspecified gastric ulcer with hemorrhage: Secondary | ICD-10-CM | POA: Diagnosis not present

## 2019-09-01 DIAGNOSIS — I5023 Acute on chronic systolic (congestive) heart failure: Secondary | ICD-10-CM | POA: Diagnosis not present

## 2019-09-01 DIAGNOSIS — I420 Dilated cardiomyopathy: Secondary | ICD-10-CM | POA: Diagnosis not present

## 2019-09-01 DIAGNOSIS — J189 Pneumonia, unspecified organism: Secondary | ICD-10-CM | POA: Diagnosis not present

## 2019-09-01 DIAGNOSIS — J449 Chronic obstructive pulmonary disease, unspecified: Secondary | ICD-10-CM | POA: Diagnosis not present

## 2019-09-01 DIAGNOSIS — I517 Cardiomegaly: Secondary | ICD-10-CM | POA: Diagnosis not present

## 2019-09-01 DIAGNOSIS — Z7984 Long term (current) use of oral hypoglycemic drugs: Secondary | ICD-10-CM | POA: Diagnosis not present

## 2019-09-01 DIAGNOSIS — I2510 Atherosclerotic heart disease of native coronary artery without angina pectoris: Secondary | ICD-10-CM | POA: Diagnosis not present

## 2019-09-01 DIAGNOSIS — Z955 Presence of coronary angioplasty implant and graft: Secondary | ICD-10-CM | POA: Diagnosis not present

## 2019-09-01 DIAGNOSIS — R5381 Other malaise: Secondary | ICD-10-CM | POA: Diagnosis not present

## 2019-09-01 DIAGNOSIS — J441 Chronic obstructive pulmonary disease with (acute) exacerbation: Secondary | ICD-10-CM | POA: Diagnosis not present

## 2019-09-01 DIAGNOSIS — Z7401 Bed confinement status: Secondary | ICD-10-CM | POA: Diagnosis not present

## 2019-09-01 DIAGNOSIS — Z8673 Personal history of transient ischemic attack (TIA), and cerebral infarction without residual deficits: Secondary | ICD-10-CM | POA: Diagnosis not present

## 2019-09-01 DIAGNOSIS — I429 Cardiomyopathy, unspecified: Secondary | ICD-10-CM | POA: Diagnosis not present

## 2019-09-01 DIAGNOSIS — J9601 Acute respiratory failure with hypoxia: Secondary | ICD-10-CM | POA: Diagnosis not present

## 2019-09-01 DIAGNOSIS — I447 Left bundle-branch block, unspecified: Secondary | ICD-10-CM | POA: Diagnosis not present

## 2019-09-01 DIAGNOSIS — Z7901 Long term (current) use of anticoagulants: Secondary | ICD-10-CM | POA: Diagnosis not present

## 2019-09-01 DIAGNOSIS — Z79899 Other long term (current) drug therapy: Secondary | ICD-10-CM | POA: Diagnosis not present

## 2019-09-01 DIAGNOSIS — Z905 Acquired absence of kidney: Secondary | ICD-10-CM | POA: Diagnosis not present

## 2019-09-01 DIAGNOSIS — R0602 Shortness of breath: Secondary | ICD-10-CM | POA: Diagnosis not present

## 2019-09-01 DIAGNOSIS — D509 Iron deficiency anemia, unspecified: Secondary | ICD-10-CM | POA: Diagnosis not present

## 2019-09-01 DIAGNOSIS — I130 Hypertensive heart and chronic kidney disease with heart failure and stage 1 through stage 4 chronic kidney disease, or unspecified chronic kidney disease: Secondary | ICD-10-CM | POA: Diagnosis not present

## 2019-09-02 ENCOUNTER — Other Ambulatory Visit: Payer: Self-pay

## 2019-09-02 ENCOUNTER — Telehealth: Payer: Self-pay | Admitting: Cardiology

## 2019-09-02 DIAGNOSIS — I4892 Unspecified atrial flutter: Secondary | ICD-10-CM | POA: Diagnosis not present

## 2019-09-02 DIAGNOSIS — N183 Chronic kidney disease, stage 3 unspecified: Secondary | ICD-10-CM | POA: Diagnosis not present

## 2019-09-02 DIAGNOSIS — J9601 Acute respiratory failure with hypoxia: Secondary | ICD-10-CM | POA: Diagnosis not present

## 2019-09-02 DIAGNOSIS — D649 Anemia, unspecified: Secondary | ICD-10-CM | POA: Diagnosis not present

## 2019-09-02 DIAGNOSIS — I482 Chronic atrial fibrillation, unspecified: Secondary | ICD-10-CM | POA: Diagnosis not present

## 2019-09-02 DIAGNOSIS — I480 Paroxysmal atrial fibrillation: Secondary | ICD-10-CM | POA: Diagnosis not present

## 2019-09-02 DIAGNOSIS — E7849 Other hyperlipidemia: Secondary | ICD-10-CM | POA: Diagnosis not present

## 2019-09-02 DIAGNOSIS — I2510 Atherosclerotic heart disease of native coronary artery without angina pectoris: Secondary | ICD-10-CM | POA: Diagnosis not present

## 2019-09-02 DIAGNOSIS — I130 Hypertensive heart and chronic kidney disease with heart failure and stage 1 through stage 4 chronic kidney disease, or unspecified chronic kidney disease: Secondary | ICD-10-CM | POA: Diagnosis not present

## 2019-09-02 DIAGNOSIS — Z515 Encounter for palliative care: Secondary | ICD-10-CM | POA: Diagnosis not present

## 2019-09-02 DIAGNOSIS — I5023 Acute on chronic systolic (congestive) heart failure: Secondary | ICD-10-CM | POA: Diagnosis not present

## 2019-09-02 DIAGNOSIS — E1122 Type 2 diabetes mellitus with diabetic chronic kidney disease: Secondary | ICD-10-CM | POA: Diagnosis not present

## 2019-09-02 DIAGNOSIS — J449 Chronic obstructive pulmonary disease, unspecified: Secondary | ICD-10-CM | POA: Diagnosis not present

## 2019-09-02 DIAGNOSIS — I25119 Atherosclerotic heart disease of native coronary artery with unspecified angina pectoris: Secondary | ICD-10-CM | POA: Diagnosis not present

## 2019-09-02 DIAGNOSIS — N189 Chronic kidney disease, unspecified: Secondary | ICD-10-CM | POA: Diagnosis not present

## 2019-09-02 DIAGNOSIS — I429 Cardiomyopathy, unspecified: Secondary | ICD-10-CM | POA: Diagnosis not present

## 2019-09-02 MED ORDER — AMIODARONE HCL 200 MG PO TABS: 200.0000 mg | ORAL_TABLET | Freq: Two times a day (BID) | ORAL | 3 refills | Status: AC

## 2019-09-02 NOTE — Telephone Encounter (Signed)
Call [facility] to [facility] on [address]

## 2019-09-02 NOTE — Progress Notes (Signed)
Rx sent to pharmacy per patient request. 

## 2019-09-02 NOTE — Telephone Encounter (Signed)
Done  Sent to pharmacy

## 2019-09-07 DIAGNOSIS — I5023 Acute on chronic systolic (congestive) heart failure: Secondary | ICD-10-CM | POA: Diagnosis not present

## 2019-09-07 DIAGNOSIS — D509 Iron deficiency anemia, unspecified: Secondary | ICD-10-CM | POA: Diagnosis not present

## 2019-09-07 DIAGNOSIS — I482 Chronic atrial fibrillation, unspecified: Secondary | ICD-10-CM | POA: Diagnosis not present

## 2019-09-07 DIAGNOSIS — Z79899 Other long term (current) drug therapy: Secondary | ICD-10-CM | POA: Diagnosis not present

## 2019-09-07 DIAGNOSIS — J449 Chronic obstructive pulmonary disease, unspecified: Secondary | ICD-10-CM | POA: Diagnosis not present

## 2019-09-09 ENCOUNTER — Other Ambulatory Visit: Payer: Self-pay

## 2019-09-09 ENCOUNTER — Encounter: Payer: Self-pay | Admitting: Cardiology

## 2019-09-09 ENCOUNTER — Telehealth (INDEPENDENT_AMBULATORY_CARE_PROVIDER_SITE_OTHER): Payer: PPO | Admitting: Cardiology

## 2019-09-09 VITALS — Ht 64.0 in | Wt 176.0 lb

## 2019-09-09 DIAGNOSIS — I420 Dilated cardiomyopathy: Secondary | ICD-10-CM

## 2019-09-09 DIAGNOSIS — I482 Chronic atrial fibrillation, unspecified: Secondary | ICD-10-CM

## 2019-09-09 DIAGNOSIS — I2510 Atherosclerotic heart disease of native coronary artery without angina pectoris: Secondary | ICD-10-CM | POA: Diagnosis not present

## 2019-09-09 DIAGNOSIS — I5022 Chronic systolic (congestive) heart failure: Secondary | ICD-10-CM

## 2019-09-09 NOTE — Progress Notes (Signed)
Virtual Visit via Telephone Note   This visit type was conducted due to national recommendations for restrictions regarding the COVID-19 Pandemic (e.g. social distancing) in an effort to limit this patient's exposure and mitigate transmission in our community.  Due to her co-morbid illnesses, this patient is at least at moderate risk for complications without adequate follow up.  This format is felt to be most appropriate for this patient at this time.  The patient did not have access to video technology/had technical difficulties with video requiring transitioning to audio format only (telephone).  All issues noted in this document were discussed and addressed.  No physical exam could be performed with this format.  Please refer to the patient's chart for her  consent to telehealth for [facility].   Date:  09/09/2019   ID:  Kaitlyn Underwood, DOB 1941/05/15, MRN [id number]  Patient Location: Home Provider Location: Office  PCP:  Lowella Dandy, NP  Cardiologist:  No primary care provider on file.  Electrophysiologist:  None   Evaluation Performed:  Follow-Up Visit  Chief Complaint: Congestive heart failure and cardiomyopathy  History of Present Illness:    Kaitlyn Underwood is a 79 y.o. female with past medical history of essential hypertension and atrial fibrillation.  She recently was in [facility].  I reviewed the records.  Her ejection fraction was 15 to 20% and she came in congestive heart failure and was treated and released and subsequently has done well.  She is on amiodarone and also has a LifeVest.  She attended this visit by telephone because of technical issues.  Her daughter was with her on the phone helping her.  She denied any chest pain orthopnea or PND.  At the time of my evaluation, the patient is alert awake oriented and in no distress.  She has been checking her weight meticulously and it has been stable and also is watching her salt in the diet.  The patient does  not have symptoms concerning for COVID-19 infection (fever, chills, cough, or new shortness of breath).    Past Medical History:  Diagnosis Date  . Anxiety   . Atrial fibrillation ([place])   . Chronic systolic heart failure ([place])   . COPD (chronic obstructive pulmonary disease) ([place])   . Coronary artery disease   . Hypertension   . Hypothyroidism   . Left renal mass    unconfirmed documentation of "kidney cancer", possibly cyst  . Pre-diabetes   . Stroke [facility])    Past Surgical History:  Procedure Laterality Date  . BIOPSY  04/20/2019   Procedure: BIOPSY;  Surgeon: Carol Ada, MD;  Location: [facility];  Service: Endoscopy;;  . COLONOSCOPY WITH PROPOFOL N/A 04/20/2019   Procedure: COLONOSCOPY WITH PROPOFOL;  Surgeon: Carol Ada, MD;  [facility];  Service: Endoscopy;  Laterality: N/A;  . ESOPHAGOGASTRODUODENOSCOPY (EGD) WITH PROPOFOL N/A 04/20/2019   Procedure: ESOPHAGOGASTRODUODENOSCOPY (EGD) WITH PROPOFOL;  Surgeon: Carol Ada, MD;  [facility];  Service: Endoscopy;  Laterality: N/A;  . HOT HEMOSTASIS N/A 04/20/2019   Procedure: HOT HEMOSTASIS (ARGON PLASMA COAGULATION/BICAP);  Surgeon: Carol Ada, MD;  Location[facility];  Service: Endoscopy;  Laterality: N/A;  . IR CT HEAD LTD  07/06/2018  . IR PERCUTANEOUS ART THROMBECTOMY/INFUSION INTRACRANIAL INC DIAG ANGIO  07/06/2018  . LEFT HEART CATH AND CORONARY ANGIOGRAPHY N/A 04/29/2017   Procedure: LEFT HEART CATH AND CORONARY ANGIOGRAPHY;  Surgeon: Belva Crome, MD;  Location: [place] CV LAB;  Service:  Cardiovascular;  Laterality: N/A;  . POLYPECTOMY  04/20/2019   Procedure: POLYPECTOMY;  Surgeon: Carol Ada, MD;  [facility];  Service: Endoscopy;;  . RADIOLOGY WITH ANESTHESIA N/A 07/05/2018   Procedure: RADIOLOGY WITH ANESTHESIA;  Surgeon: Luanne Bras, MD;  Location: [place];  Service: Radiology;  Laterality: N/A;  . ULTRASOUND GUIDANCE FOR VASCULAR ACCESS  04/29/2017   Procedure:  Ultrasound Guidance For Vascular Access;  Surgeon: Belva Crome, MD;  Location: [place] CV LAB;  Service: Cardiovascular;;     Current Meds  Medication Sig  . acetaminophen (TYLENOL) 325 MG tablet Take 2 tablets (650 mg total) by mouth every 4 (four) hours as needed for mild pain (or temp > 37.5 C (99.5 F)).  Marland Kitchen albuterol (PROVENTIL HFA;VENTOLIN HFA) 108 (90 Base) MCG/ACT inhaler Inhale 2 puffs into the lungs every 6 (six) hours as needed for wheezing or shortness of breath.  Marland Kitchen amiodarone (PACERONE) 200 MG tablet Take 1 tablet (200 mg total) by mouth 2 (two) times daily.  Marland Kitchen apixaban (ELIQUIS) 5 MG TABS tablet Take 1 tablet (5 mg total) by mouth 2 (two) times daily.  . budesonide (PULMICORT) 0.5 MG/2ML nebulizer solution Take 0.5 mg by nebulization daily.   . carvedilol (COREG) 6.25 MG tablet Take 1 tablet (6.25 mg total) by mouth 2 (two) times daily with a meal.  . furosemide (LASIX) 40 MG tablet Take 40 mg by mouth daily.  Marland Kitchen gabapentin (NEURONTIN) 300 MG capsule Take 300 mg by mouth 3 (three) times daily.  Marland Kitchen ipratropium-albuterol (DUONEB) 0.5-2.5 (3) MG/3ML SOLN Take 3 mLs by nebulization every 6 (six) hours as needed (shortness of breath/wheezing.).   Marland Kitchen metoprolol tartrate (LOPRESSOR) 25 MG tablet Take 0.5 tablets (12.5 mg total) by mouth 2 (two) times daily.  . montelukast (SINGULAIR) 10 MG tablet Take 1 tablet (10 mg total) by mouth daily.  . Multiple Vitamin (MULTIVITAMIN WITH MINERALS) TABS tablet Take 1 tablet by mouth daily.  . nitroGLYCERIN (NITROSTAT) 0.4 MG SL tablet Place 0.4 mg under the tongue every 5 (five) minutes as needed for chest pain.   . pravastatin (PRAVACHOL) 20 MG tablet Take 1 tablet (20 mg total) by mouth daily at 6 PM.  . sacubitril-valsartan (ENTRESTO) 24-26 MG Take 1 tablet by mouth 2 (two) times daily.  . sertraline (ZOLOFT) 25 MG tablet Take 1 tablet (25 mg total) by mouth daily. (Patient taking differently: Take 50 mg by mouth daily. )  . traMADol (ULTRAM)  50 MG tablet Take 50 mg by mouth 3 (three) times daily.     Allergies:   Sulfa antibiotics   Social History   Tobacco Use  . Smoking status: Former Smoker    Packs/day: 1.00    Years: 20.00    Pack years: 20.00    Types: Cigarettes    Quit date: 06/19/1990    Years since quitting: 29.2  . Smokeless tobacco: Never Used  Substance Use Topics  . Alcohol use: No    Alcohol/week: 0.0 standard drinks  . Drug use: No     Family Hx: The patient's family history includes Arrhythmia in her sister; Heart attack in her maternal uncle; Heart disease in her brother.  ROS:   Please see the history of present illness.    She denies any chest pain orthopnea or PND All other systems reviewed and are negative.   Prior CV studies:   The following studies were reviewed today:  Echocardiogram reveals ejection fraction of 15 to 20% and [place] hospital  records were reviewed  Labs/Other Tests and Data Reviewed:    EKG:  I reviewed EKG from previous visit  Recent Labs: 04/19/2019: B Natriuretic Peptide 544.3 04/22/2019: BUN 17; Creatinine, Ser 0.87; Hemoglobin 8.9; Magnesium 2.0; Platelets 504; Potassium 3.6; Sodium 137   Recent Lipid Panel Lab Results  Component Value Date/Time   CHOL 146 07/06/2018 07:04 AM   CHOL 189 05/12/2018 12:12 PM   TRIG 124 07/06/2018 07:04 AM   HDL 49 07/06/2018 07:04 AM   HDL 63 05/12/2018 12:12 PM   CHOLHDL 3.0 07/06/2018 07:04 AM   LDLCALC 72 07/06/2018 07:04 AM   LDLCALC 103 (H) 05/12/2018 12:12 PM    Wt Readings from Last 3 Encounters:  09/09/19 176 lb (79.8 kg)  07/27/19 188 lb (85.3 kg)  04/19/19 192 lb 14.4 oz (87.5 kg)     Objective:    Vital Signs:  Ht 5\' 4"  (1.626 m)   Wt 176 lb (79.8 kg)   BMI 30.21 kg/m    VITAL SIGNS:  reviewed  ASSESSMENT & PLAN:    1. Congestive heart failure and advanced cardiomyopathy: Patient has been evaluated at [facility] and I discussed this with her at extensive length.  Congestive  heart failure education was given including daily weight checks and diet and salt intake issues and she promises to comply with it.  Her daughter is very supportive.  She has a LifeVest for primary prevention of sudden cardiac death. 2. Essential hypertension: Blood pressure stable during hospital visit.  They have not checked at this time and I encouraged him to buy a machine to check it at home 3. She will be seen in follow-up appointment in 2 weeks at the office at which point I would like to do evaluation especially blood work and we will have to keep an eye on the ejection fraction and systolic function and assess its progress.  This was discussed with her extensively and total time including review of hospital records was 40 minutes.  COVID-19 Education: The signs and symptoms of COVID-19 were discussed with the patient and how to seek care for testing (follow up with PCP or arrange E-visit).  The importance of social distancing was discussed today.  Time:   Today, I have spent 40 minutes with the patient with telehealth technology discussing the above problems.     Medication Adjustments/Labs and Tests Ordered: Current medicines are reviewed at length with the patient today.  Concerns regarding medicines are outlined above.   Tests Ordered: No orders of the defined types were placed in this encounter.   Medication Changes: No orders of the defined types were placed in this encounter.   Follow Up:  In Person 2 weeks  Signed, Jenean Lindau, MD  09/09/2019 11:51 AM    [place]

## 2019-09-15 ENCOUNTER — Telehealth: Payer: Self-pay | Admitting: Cardiology

## 2019-09-15 NOTE — Telephone Encounter (Signed)
Loma Sousa, with [employer], is calling to inquire about whether the patient has improved or not. Please return call at [phone].

## 2019-09-16 DIAGNOSIS — I5023 Acute on chronic systolic (congestive) heart failure: Secondary | ICD-10-CM | POA: Diagnosis not present

## 2019-09-16 DIAGNOSIS — J449 Chronic obstructive pulmonary disease, unspecified: Secondary | ICD-10-CM | POA: Diagnosis not present

## 2019-09-17 DIAGNOSIS — J449 Chronic obstructive pulmonary disease, unspecified: Secondary | ICD-10-CM | POA: Diagnosis not present

## 2019-09-17 DIAGNOSIS — I5023 Acute on chronic systolic (congestive) heart failure: Secondary | ICD-10-CM | POA: Diagnosis not present

## 2019-09-27 DIAGNOSIS — J449 Chronic obstructive pulmonary disease, unspecified: Secondary | ICD-10-CM | POA: Diagnosis not present

## 2019-09-30 ENCOUNTER — Other Ambulatory Visit: Payer: PPO

## 2019-10-01 ENCOUNTER — Telehealth: Payer: PPO | Admitting: Cardiology

## 2019-10-18 DEATH — deceased
